# Patient Record
Sex: Male | Born: 1945 | Race: Black or African American | Hispanic: No | Marital: Single | Smoking: Former smoker
Health system: Southern US, Community
[De-identification: ages and names within clinical notes are randomized; demographics above are authoritative.]

## PROBLEM LIST (undated history)

## (undated) DIAGNOSIS — R569 Unspecified convulsions: Secondary | ICD-10-CM

## (undated) DIAGNOSIS — I2699 Other pulmonary embolism without acute cor pulmonale: Secondary | ICD-10-CM

## (undated) DIAGNOSIS — I609 Nontraumatic subarachnoid hemorrhage, unspecified: Secondary | ICD-10-CM

## (undated) DIAGNOSIS — I10 Essential (primary) hypertension: Secondary | ICD-10-CM

## (undated) DIAGNOSIS — I4891 Unspecified atrial fibrillation: Secondary | ICD-10-CM

## (undated) DIAGNOSIS — N183 Chronic kidney disease, stage 3 unspecified: Secondary | ICD-10-CM

## (undated) DIAGNOSIS — I272 Pulmonary hypertension, unspecified: Secondary | ICD-10-CM

## (undated) DIAGNOSIS — I639 Cerebral infarction, unspecified: Secondary | ICD-10-CM

## (undated) DIAGNOSIS — I5022 Chronic systolic (congestive) heart failure: Secondary | ICD-10-CM

## (undated) DIAGNOSIS — I429 Cardiomyopathy, unspecified: Secondary | ICD-10-CM

## (undated) DIAGNOSIS — J449 Chronic obstructive pulmonary disease, unspecified: Secondary | ICD-10-CM

## (undated) DIAGNOSIS — I5189 Other ill-defined heart diseases: Secondary | ICD-10-CM

## (undated) HISTORY — PX: ANKLE SURGERY: SHX546

## (undated) HISTORY — PX: TOTAL KNEE ARTHROPLASTY: SHX125

## (undated) HISTORY — PX: CORONARY ANGIOPLASTY: SHX604

---

## 2012-09-25 HISTORY — PX: INSERTION OF ICD: SHX6689

## 2014-07-15 DIAGNOSIS — I609 Nontraumatic subarachnoid hemorrhage, unspecified: Secondary | ICD-10-CM

## 2014-07-15 HISTORY — DX: Nontraumatic subarachnoid hemorrhage, unspecified: I60.9

## 2014-09-15 DIAGNOSIS — I639 Cerebral infarction, unspecified: Secondary | ICD-10-CM

## 2014-09-15 HISTORY — DX: Cerebral infarction, unspecified: I63.9

## 2014-10-14 DIAGNOSIS — I2699 Other pulmonary embolism without acute cor pulmonale: Secondary | ICD-10-CM

## 2014-10-14 HISTORY — PX: IVC FILTER PLACEMENT (ARMC HX): HXRAD1551

## 2014-10-14 HISTORY — DX: Other pulmonary embolism without acute cor pulmonale: I26.99

## 2015-05-16 HISTORY — PX: OTHER SURGICAL HISTORY: SHX169

## 2016-02-03 ENCOUNTER — Emergency Department (HOSPITAL_COMMUNITY)
Admission: EM | Admit: 2016-02-03 | Discharge: 2016-02-03 | Disposition: A | Discharge status: Home / Self Care | Payer: Medicare Other | Attending: Emergency Medicine | Admitting: Emergency Medicine

## 2016-02-03 ENCOUNTER — Emergency Department (HOSPITAL_COMMUNITY): Payer: Medicare Other

## 2016-02-03 ENCOUNTER — Encounter (HOSPITAL_COMMUNITY): Payer: Self-pay | Admitting: Emergency Medicine

## 2016-02-03 ENCOUNTER — Other Ambulatory Visit: Payer: Self-pay

## 2016-02-03 DIAGNOSIS — Z950 Presence of cardiac pacemaker: Secondary | ICD-10-CM | POA: Insufficient documentation

## 2016-02-03 DIAGNOSIS — Z79899 Other long term (current) drug therapy: Secondary | ICD-10-CM | POA: Diagnosis not present

## 2016-02-03 DIAGNOSIS — I509 Heart failure, unspecified: Secondary | ICD-10-CM | POA: Insufficient documentation

## 2016-02-03 DIAGNOSIS — Z7982 Long term (current) use of aspirin: Secondary | ICD-10-CM | POA: Diagnosis not present

## 2016-02-03 DIAGNOSIS — J441 Chronic obstructive pulmonary disease with (acute) exacerbation: Secondary | ICD-10-CM

## 2016-02-03 DIAGNOSIS — I110 Hypertensive heart disease with heart failure: Secondary | ICD-10-CM | POA: Insufficient documentation

## 2016-02-03 DIAGNOSIS — R0600 Dyspnea, unspecified: Secondary | ICD-10-CM

## 2016-02-03 DIAGNOSIS — R0602 Shortness of breath: Secondary | ICD-10-CM | POA: Diagnosis present

## 2016-02-03 HISTORY — DX: Unspecified convulsions: R56.9

## 2016-02-03 LAB — DIGOXIN LEVEL: DIGOXIN LVL: 0.2 ng/mL — AB (ref 0.8–2.0)

## 2016-02-03 LAB — CBC WITH DIFFERENTIAL/PLATELET
BASOS ABS: 0 10*3/uL (ref 0.0–0.1)
Basophils Relative: 1 %
EOS PCT: 4 %
Eosinophils Absolute: 0.2 10*3/uL (ref 0.0–0.7)
HCT: 38.9 % — ABNORMAL LOW (ref 39.0–52.0)
Hemoglobin: 12.6 g/dL — ABNORMAL LOW (ref 13.0–17.0)
LYMPHS PCT: 35 %
Lymphs Abs: 1.2 10*3/uL (ref 0.7–4.0)
MCH: 31.9 pg (ref 26.0–34.0)
MCHC: 32.4 g/dL (ref 30.0–36.0)
MCV: 98.5 fL (ref 78.0–100.0)
MONO ABS: 0.4 10*3/uL (ref 0.1–1.0)
MONOS PCT: 10 %
Neutro Abs: 1.8 10*3/uL (ref 1.7–7.7)
Neutrophils Relative %: 50 %
PLATELETS: 226 10*3/uL (ref 150–400)
RBC: 3.95 MIL/uL — ABNORMAL LOW (ref 4.22–5.81)
RDW: 14.7 % (ref 11.5–15.5)
WBC: 3.6 10*3/uL — ABNORMAL LOW (ref 4.0–10.5)

## 2016-02-03 LAB — COMPREHENSIVE METABOLIC PANEL
ALBUMIN: 3.9 g/dL (ref 3.5–5.0)
ALK PHOS: 76 U/L (ref 38–126)
ALT: 57 U/L (ref 17–63)
AST: 34 U/L (ref 15–41)
Anion gap: 4 — ABNORMAL LOW (ref 5–15)
BILIRUBIN TOTAL: 1 mg/dL (ref 0.3–1.2)
BUN: 17 mg/dL (ref 6–20)
CO2: 27 mmol/L (ref 22–32)
Calcium: 9.1 mg/dL (ref 8.9–10.3)
Chloride: 108 mmol/L (ref 101–111)
Creatinine, Ser: 1.22 mg/dL (ref 0.61–1.24)
GFR calc Af Amer: >60 mL/min (ref >60)
GFR calc non Af Amer: 60 mL/min — ABNORMAL LOW (ref >60)
GLUCOSE: 112 mg/dL — AB (ref 65–99)
POTASSIUM: 3.8 mmol/L (ref 3.5–5.1)
SODIUM: 139 mmol/L (ref 135–145)
TOTAL PROTEIN: 7.6 g/dL (ref 6.5–8.1)

## 2016-02-03 LAB — TROPONIN I: Troponin I: 0.03 ng/mL (ref <0.031)

## 2016-02-03 LAB — PROTIME-INR
INR: 2.12 — ABNORMAL HIGH (ref 0.00–1.49)
Prothrombin Time: 23.6 seconds — ABNORMAL HIGH (ref 11.6–15.2)

## 2016-02-03 LAB — BRAIN NATRIURETIC PEPTIDE: B Natriuretic Peptide: 1595 pg/mL — ABNORMAL HIGH (ref 0.0–100.0)

## 2016-02-03 MED ORDER — AEROCHAMBER Z-STAT PLUS/MEDIUM MISC
1.0000 | Freq: Once | Status: AC
  Administered 2016-02-03: 1

## 2016-02-03 MED ORDER — FUROSEMIDE 40 MG PO TABS: ORAL_TABLET | ORAL | Status: DC

## 2016-02-03 MED ORDER — NITROGLYCERIN 2 % TD OINT
1.0000 [in_us] | TOPICAL_OINTMENT | Freq: Once | TRANSDERMAL | Status: AC
  Administered 2016-02-03: 1 [in_us] via TOPICAL
  Filled 2016-02-03: qty 1

## 2016-02-03 MED ORDER — IPRATROPIUM BROMIDE 0.02 % IN SOLN
0.5000 mg | Freq: Once | RESPIRATORY_TRACT | Status: AC
  Administered 2016-02-03: 0.5 mg via RESPIRATORY_TRACT
  Filled 2016-02-03: qty 2.5

## 2016-02-03 MED ORDER — NITROGLYCERIN 0.4 MG SL SUBL: 0.4000 mg | SUBLINGUAL_TABLET | SUBLINGUAL | Status: AC | PRN

## 2016-02-03 MED ORDER — FUROSEMIDE 10 MG/ML IJ SOLN
80.0000 mg | Freq: Once | INTRAMUSCULAR | Status: AC
  Administered 2016-02-03: 80 mg via INTRAMUSCULAR
  Filled 2016-02-03: qty 8

## 2016-02-03 MED ORDER — ALBUTEROL SULFATE HFA 108 (90 BASE) MCG/ACT IN AERS
2.0000 | INHALATION_SPRAY | Freq: Four times a day (QID) | RESPIRATORY_TRACT | Status: DC | PRN
  Administered 2016-02-03: 2 via RESPIRATORY_TRACT
  Filled 2016-02-03: qty 6.7

## 2016-02-03 MED ORDER — METHYLPREDNISOLONE SODIUM SUCC 125 MG IJ SOLR
125.0000 mg | Freq: Once | INTRAMUSCULAR | Status: AC
  Administered 2016-02-03: 125 mg via INTRAVENOUS
  Filled 2016-02-03: qty 2

## 2016-02-03 MED ORDER — ALBUTEROL SULFATE (2.5 MG/3ML) 0.083% IN NEBU
5.0000 mg | INHALATION_SOLUTION | Freq: Once | RESPIRATORY_TRACT | Status: AC
  Administered 2016-02-03: 5 mg via RESPIRATORY_TRACT
  Filled 2016-02-03: qty 6

## 2016-02-03 NOTE — ED Notes (Addendum)
Pt was 100% on RA, stated feeling like he needed oxygen. Placed on 2L [place], remains 100%.

## 2016-02-03 NOTE — ED Notes (Signed)
Pt made aware to return if symptoms worsen or if any life threatening symptoms occur.   

## 2016-02-03 NOTE — ED Notes (Signed)
Ambulated patient around nurses station 2x patients sats stayed at 100% and heart rate was 79.

## 2016-02-03 NOTE — Discharge Instructions (Signed)
Use the inhaler for wheezing and shortness of breath. Increase the Lasix to 2 tablets twice a day for the next 2-3 days until the fluid is out of your lungs then take 1 tablet daily. Use a nitroglycerin tablet supple lingual lesion if you have trouble breathing and get the chest discomfort. You can call Dr. Ival Bible office, he is a cardiologist who is associated with [facility], but he has an office in [place]. Pursue getting a primary care doctor. Return to the ED if you breathing gets worse or you have severe chest pain. Metered Dose Inhaler With Spacer Inhaled medicines are the basis of treatment of asthma and other breathing problems. Inhaled medicine can only be effective if used properly. Good technique assures that the medicine reaches the lungs. Your health care provider has asked you to use a spacer with your inhaler to help you take the medicine more effectively. A spacer is a plastic tube with a mouthpiece on one end and an opening that connects to the inhaler on the other end. Metered dose inhalers (MDIs) are used to deliver a variety of inhaled medicines. These include quick relief or rescue medicines (such as bronchodilators) and controller medicines (such as corticosteroids). The medicine is delivered by pushing down on a metal canister to release a set amount of spray. If you are using different kinds of inhalers, use your quick relief medicine to open the airways 10-15 minutes before using a steroid if instructed to do so by your health care provider. If you are unsure which inhalers to use and the order of using them, ask your health care provider, nurse, or respiratory therapist. HOW TO USE THE INHALER WITH A SPACER  Remove cap from inhaler.  If you are using the inhaler for the first time, you will need to prime it. Shake the inhaler for 5 seconds and release four puffs into the air, away from your face. Ask your health care provider or pharmacist if you have questions about  priming your inhaler.  Shake inhaler for 5 seconds before each breath in (inhalation).  Place the open end of the spacer onto the mouthpiece of the inhaler.  Position the inhaler so that the top of the canister faces up and the spacer mouthpiece faces you.  Put your index finger on the top of the medicine canister. Your thumb supports the bottom of the inhaler and the spacer.  Breathe out (exhale) normally and as completely as possible.  Immediately after exhaling, place the spacer between your teeth and into your mouth. Close your mouth tightly around the spacer.  Press the canister down with the index finger to release the medicine.  At the same time as the canister is pressed, inhale deeply and slowly until the lungs are completely filled. This should take 4-6 seconds. Keep your tongue down and out of the way.  Hold the medicine in your lungs for 5-10 seconds (10 seconds is best). This helps the medicine get into the small airways of your lungs. Exhale.  Repeat inhaling deeply through the spacer mouthpiece. Again hold that breath for up to 10 seconds (10 seconds is best). Exhale slowly. If it is difficult to take this second deep breath through the spacer, breathe normally several times through the spacer. Remove the spacer from your mouth.  Wait at least 15-30 seconds between puffs. Continue with the above steps until you have taken the number of puffs your health care provider has ordered. Do not use the inhaler  more than your health care provider directs you to.  Remove spacer from the inhaler and place cap on inhaler.  Follow the directions from your health care provider or the inhaler insert for cleaning the inhaler and spacer. If you are using a steroid inhaler, rinse your mouth with water after your last puff, gargle, and spit out the water. Do not swallow the water. AVOID:  Inhaling before or after starting the spray of medicine. It takes practice to coordinate your breathing  with triggering the spray.  Inhaling through the nose (rather than the mouth) when triggering the spray. HOW TO DETERMINE IF YOUR INHALER IS FULL OR NEARLY EMPTY You cannot know when an inhaler is empty by shaking it. A few inhalers are now being made with dose counters. Ask your health care provider for a prescription that has a dose counter if you feel you need that extra help. If your inhaler does not have a counter, ask your health care provider to help you determine the date you need to refill your inhaler. Write the refill date on a calendar or your inhaler canister. Refill your inhaler 7-10 days before it runs out. Be sure to keep an adequate supply of medicine. This includes making sure it is not expired, and you have a spare inhaler.  SEEK MEDICAL CARE IF:   Symptoms are only partially relieved with your inhaler.  You are having trouble using your inhaler.  You experience some increase in phlegm. SEEK IMMEDIATE MEDICAL CARE IF:   You feel little or no relief with your inhalers. You are still wheezing and are feeling shortness of breath or tightness in your chest or both.  You have dizziness, headaches, or fast heart rate.  You have chills, fever, or night sweats.  There is a noticeable increase in phlegm production, or there is blood in the phlegm.   This information is not intended to replace advice given to you by your health care provider. Make sure you discuss any questions you have with your health care provider.   Document Released: 08/01/2005 Document Revised: 12/16/2014 Document Reviewed: 01/17/2013 Elsevier Interactive Patient Education 2016 [employer]  Heart Failure Heart failure is a condition in which the heart has trouble pumping blood. This means your heart does not pump blood efficiently for your body to work well. In some cases of heart failure, fluid may back up into your lungs or you may have swelling (edema) in your lower legs. Heart failure is usually a  long-term (chronic) condition. It is important for you to take good care of yourself and follow your health care provider's treatment plan. CAUSES  Some health conditions can cause heart failure. Those health conditions include:  High blood pressure (hypertension). Hypertension causes the heart muscle to work harder than normal. When pressure in the blood vessels is high, the heart needs to pump (contract) with more force in order to circulate blood throughout the body. High blood pressure eventually causes the heart to become stiff and weak.  Coronary artery disease (CAD). CAD is the buildup of cholesterol and fat (plaque) in the arteries of the heart. The blockage in the arteries deprives the heart muscle of oxygen and blood. This can cause chest pain and may lead to a heart attack. High blood pressure can also contribute to CAD.  Heart attack (myocardial infarction). A heart attack occurs when one or more arteries in the heart become blocked. The loss of oxygen damages the muscle tissue of the heart. When this  happens, part of the heart muscle dies. The injured tissue does not contract as well and weakens the heart's ability to pump blood.  Abnormal heart valves. When the heart valves do not open and close properly, it can cause heart failure. This makes the heart muscle pump harder to keep the blood flowing.  Heart muscle disease (cardiomyopathy or myocarditis). Heart muscle disease is damage to the heart muscle from a variety of causes. These can include drug or alcohol abuse, infections, or unknown reasons. These can increase the risk of heart failure.  Lung disease. Lung disease makes the heart work harder because the lungs do not work properly. This can cause a strain on the heart, leading it to fail.  Diabetes. Diabetes increases the risk of heart failure. High blood sugar contributes to high fat (lipid) levels in the blood. Diabetes can also cause slow damage to tiny blood vessels that  carry important nutrients to the heart muscle. When the heart does not get enough oxygen and food, it can cause the heart to become weak and stiff. This leads to a heart that does not contract efficiently.  Other conditions can contribute to heart failure. These include abnormal heart rhythms, thyroid problems, and low blood counts (anemia). Certain unhealthy behaviors can increase the risk of heart failure, including:  Being overweight.  Smoking or chewing tobacco.  Eating foods high in fat and cholesterol.  Abusing illicit drugs or alcohol.  Lacking physical activity. SYMPTOMS  Heart failure symptoms may vary and can be hard to detect. Symptoms may include:  Shortness of breath with activity, such as climbing stairs.  Persistent cough.  Swelling of the feet, ankles, legs, or abdomen.  Unexplained weight gain.  Difficulty breathing when lying flat (orthopnea).  Waking from sleep because of the need to sit up and get more air.  Rapid heartbeat.  Fatigue and loss of energy.  Feeling light-headed, dizzy, or close to fainting.  Loss of appetite.  Nausea.  Increased urination during the night (nocturia). DIAGNOSIS  A diagnosis of heart failure is based on your history, symptoms, physical examination, and diagnostic tests. Diagnostic tests for heart failure may include:  Echocardiography.  Electrocardiography.  Chest X-ray.  Blood tests.  Exercise stress test.  Cardiac angiography.  Radionuclide scans. TREATMENT  Treatment is aimed at managing the symptoms of heart failure. Medicines, behavioral changes, or surgical intervention may be necessary to treat heart failure.  Medicines to help treat heart failure may include:  Angiotensin-converting enzyme (ACE) inhibitors. This type of medicine blocks the effects of a blood protein called angiotensin-converting enzyme. ACE inhibitors relax (dilate) the blood vessels and help lower blood pressure.  Angiotensin  receptor blockers (ARBs). This type of medicine blocks the actions of a blood protein called angiotensin. Angiotensin receptor blockers dilate the blood vessels and help lower blood pressure.  Water pills (diuretics). Diuretics cause the kidneys to remove salt and water from the blood. The extra fluid is removed through urination. This loss of extra fluid lowers the volume of blood the heart pumps.  Beta blockers. These prevent the heart from beating too fast and improve heart muscle strength.  Digitalis. This increases the force of the heartbeat.  Healthy behavior changes include:  Obtaining and maintaining a healthy weight.  Stopping smoking or chewing tobacco.  Eating heart-healthy foods.  Limiting or avoiding alcohol.  Stopping illicit drug use.  Physical activity as directed by your health care provider.  Surgical treatment for heart failure may include:  A procedure to  open blocked arteries, repair damaged heart valves, or remove damaged heart muscle tissue.  A pacemaker to improve heart muscle function and control certain abnormal heart rhythms.  An internal cardioverter defibrillator to treat certain serious abnormal heart rhythms.  A left ventricular assist device (LVAD) to assist the pumping ability of the heart. HOME CARE INSTRUCTIONS   Take medicines only as directed by your health care provider. Medicines are important in reducing the workload of your heart, slowing the progression of heart failure, and improving your symptoms.  Do not stop taking your medicine unless directed by your health care provider.  Do not skip any dose of medicine.  Refill your prescriptions before you run out of medicine. Your medicines are needed every day.  Engage in moderate physical activity if directed by your health care provider. Moderate physical activity can benefit some people. The elderly and people with severe heart failure should consult with a health care provider for  physical activity recommendations.  Eat heart-healthy foods. Food choices should be free of trans fat and low in saturated fat, cholesterol, and salt (sodium). Healthy choices include fresh or frozen fruits and vegetables, fish, lean meats, legumes, fat-free or low-fat dairy products, and whole grain or high fiber foods. Talk to a dietitian to learn more about heart-healthy foods.  Limit sodium if directed by your health care provider. Sodium restriction may reduce symptoms of heart failure in some people. Talk to a dietitian to learn more about heart-healthy seasonings.  Use healthy cooking methods. Healthy cooking methods include roasting, grilling, broiling, baking, poaching, steaming, or stir-frying. Talk to a dietitian to learn more about healthy cooking methods.  Limit fluids if directed by your health care provider. Fluid restriction may reduce symptoms of heart failure in some people.  Weigh yourself every day. Daily weights are important in the early recognition of excess fluid. You should weigh yourself every morning after you urinate and before you eat breakfast. Wear the same amount of clothing each time you weigh yourself. Record your daily weight. Provide your health care provider with your weight record.  Monitor and record your blood pressure if directed by your health care provider.  Check your pulse if directed by your health care provider.  Lose weight if directed by your health care provider. Weight loss may reduce symptoms of heart failure in some people.  Stop smoking or chewing tobacco. Nicotine makes your heart work harder by causing your blood vessels to constrict. Do not use nicotine gum or patches before talking to your health care provider.  Keep all follow-up visits as directed by your health care provider. This is important.  Limit alcohol intake to no more than 1 drink per day for nonpregnant women and 2 drinks per day for men. One drink equals 12 ounces of beer,  5 ounces of wine, or 1 ounces of hard liquor. Drinking more than that is harmful to your heart. Tell your health care provider if you drink alcohol several times a week. Talk with your health care provider about whether alcohol is safe for you. If your heart has already been damaged by alcohol or you have severe heart failure, drinking alcohol should be stopped completely.  Stop illicit drug use.  Stay up-to-date with immunizations. It is especially important to prevent respiratory infections through current pneumococcal and influenza immunizations.  Manage other health conditions such as hypertension, diabetes, thyroid disease, or abnormal heart rhythms as directed by your health care provider.  Learn to manage stress.  Plan  rest periods when fatigued.  Learn strategies to manage high temperatures. If the weather is extremely hot:  Avoid vigorous physical activity.  Use air conditioning or fans or seek a cooler location.  Avoid caffeine and alcohol.  Wear loose-fitting, lightweight, and light-colored clothing.  Learn strategies to manage cold temperatures. If the weather is extremely cold:  Avoid vigorous physical activity.  Layer clothes.  Wear mittens or gloves, a hat, and a scarf when going outside.  Avoid alcohol.  Obtain ongoing education and support as needed.  Participate in or seek rehabilitation as needed to maintain or improve independence and quality of life. SEEK MEDICAL CARE IF:   You have a rapid weight gain.  You have increasing shortness of breath that is unusual for you.  You are unable to participate in your usual physical activities.  You tire easily.  You cough more than normal, especially with physical activity.  You have any or more swelling in areas such as your hands, feet, ankles, or abdomen.  You are unable to sleep because it is hard to breathe.  You feel like your heart is beating fast (palpitations).  You become dizzy or light-headed  upon standing up. SEEK IMMEDIATE MEDICAL CARE IF:   You have difficulty breathing.  There is a change in mental status such as decreased alertness or difficulty with concentration.  You have a pain or discomfort in your chest.  You have an episode of fainting (syncope). MAKE SURE YOU:   Understand these instructions.  Will watch your condition.  Will get help right away if you are not doing well or get worse.   This information is not intended to replace advice given to you by your health care provider. Make sure you discuss any questions you have with your health care provider.   Document Released: 08/01/2005 Document Revised: 12/16/2014 Document Reviewed: 08/31/2012 Elsevier Interactive Patient Education [employer]

## 2016-02-03 NOTE — ED Provider Notes (Addendum)
CSN: [id number]     Arrival date & time 02/03/16  0458 History   First MD Initiated Contact with Patient 02/03/16 0507 AM   Chief Complaint  Patient presents with  . Shortness of Breath     (Consider location/radiation/quality/duration/timing/severity/associated sxs/prior Treatment) HPI patient reports he just moved to [place] from [place] on June 17. He went to the ED at [facility] on the following day. He states they gave him a water pill. He states he has had PND since 2013. He states several months ago he was admitted to the hospital for a week then sent to a rehabilitation center to get his blood pressure controlled. He reports that about 10 PM tonight he started feeling short of breath and felt like he was wheezing. He states he has used inhalers and nebulizers in the past but not in a long time. He states he's had a cough for a long time and coughs up brown mucus. He states he gets chest pain in the center of his chest when he gets short of breath it gets worse. He describes it as a burning and aching feeling. He denies any swelling of his abdomen or extremities. He denies any fever. He has been on home oxygen in the past but not since he had his pacemaker inserted.He states "the fluid pill they gave me isn't strong enough".  PCP none local yet, has called Dr Almond Lint office in [place]  Past Medical History  Diagnosis Date  . Seizures (HCC)   . Hypertension   . CHF (congestive heart failure) [facility])    Past Surgical History  Procedure Laterality Date  . Pacemaker insertion     No family history on file. Social History  Substance Use Topics  . Smoking status: Never Smoker   . Smokeless tobacco: None  . Alcohol Use: No  lives alone Used to smoke 1-1/2 packs a day until 3 years ago  Review of Systems  All other systems reviewed and are negative.     Allergies  Review of patient's allergies indicates no known allergies.  Home Medications   Prior to Admission  medications   Medication Sig Start Date End Date Taking? Authorizing Provider  aspirin EC 81 MG tablet Take 81 mg by mouth daily.   Yes Historical Provider, MD  carvedilol (COREG) 12.5 MG tablet Take 12.5 mg by mouth 2 (two) times daily with a meal.   Yes Historical Provider, MD  digoxin (LANOXIN) 0.125 MG tablet Take by mouth daily.   Yes Historical Provider, MD  esomeprazole (NEXIUM) 20 MG capsule Take 20 mg by mouth daily at 12 noon.   Yes Historical Provider, MD  hydrALAZINE (APRESOLINE) 25 MG tablet Take 25 mg by mouth 3 (three) times daily.   Yes Historical Provider, MD  levETIRAcetam (KEPPRA) 500 MG tablet Take 500 mg by mouth 2 (two) times daily.   Yes Historical Provider, MD  lisinopril (PRINIVIL,ZESTRIL) 2.5 MG tablet Take 2.5 mg by mouth 2 (two) times daily.   Yes Historical Provider, MD  LORazepam (ATIVAN) 0.5 MG tablet Take 0.5 mg by mouth every 8 (eight) hours as needed for anxiety.   Yes Historical Provider, MD  potassium chloride SA (K-DUR,KLOR-CON) 20 MEQ tablet Take 40 mEq by mouth daily.   Yes Historical Provider, MD  warfarin (COUMADIN) 1 MG tablet Take 1 mg by mouth daily at 6 PM.   Yes Historical Provider, MD  furosemide (LASIX) 40 MG tablet Take 2 po BID x 2-3 days until the fluid  is gone then take 1 po QD 02/03/16   Devoria Albe, MD  nitroGLYCERIN (NITROSTAT) 0.4 MG SL tablet Place 1 tablet (0.4 mg total) under the tongue every 5 (five) minutes as needed for chest pain. 02/03/16   Devoria Albe, MD   BP 109/82 mmHg  Pulse 58  Temp(Src) 98.4 F (36.9 C) (Oral)  Resp 20  SpO2 100%    Vital signs normal Except bradycardia   Physical Exam  Constitutional: He is oriented to person, place, and time. He appears well-developed and well-nourished.  Non-toxic appearance. He does not appear ill. No distress.  HENT:  Head: Normocephalic and atraumatic.  Right Ear: External ear normal.  Left Ear: External ear normal.  Nose: Nose normal. No mucosal edema or rhinorrhea.  Mouth/Throat:  Oropharynx is clear and moist and mucous membranes are normal. No dental abscesses or uvula swelling.  Eyes: Conjunctivae and EOM are normal. Pupils are equal, round, and reactive to light.  Neck: Normal range of motion and full passive range of motion without pain. Neck supple.  Cardiovascular: Normal rate, regular rhythm and normal heart sounds.  Exam reveals no gallop and no friction rub.   No murmur heard. Pulmonary/Chest: Effort normal and breath sounds normal. No respiratory distress. He has no wheezes. He has no rhonchi. He has no rales. He exhibits no tenderness and no crepitus.  Pacemaker in left chest. Pt coughs when he takes a deep breath  Abdominal: Soft. Normal appearance and bowel sounds are normal. He exhibits no distension. There is no tenderness. There is no rebound and no guarding.  Musculoskeletal: Normal range of motion. He exhibits no edema or tenderness.  Moves all extremities well.   Neurological: He is alert and oriented to person, place, and time. He has normal strength. No cranial nerve deficit.  Skin: Skin is warm, dry and intact. No rash noted. No erythema. No pallor.  Psychiatric: He has a normal mood and affect. His speech is normal and behavior is normal. His mood appears not anxious.  Nursing note and vitals reviewed.   ED Course  Procedures (including critical care time)  Medications  albuterol (PROVENTIL HFA;VENTOLIN HFA) 108 (90 Base) MCG/ACT inhaler 2 puff (not administered)  aerochamber Z-Stat Plus/medium 1 each (not administered)  nitroGLYCERIN (NITROGLYN) 2 % ointment 1 inch (1 inch Topical Given 02/03/16 0545)  albuterol (PROVENTIL) (2.5 MG/3ML) 0.083% nebulizer solution 5 mg (5 mg Nebulization Given 02/03/16 0530)  ipratropium (ATROVENT) nebulizer solution 0.5 mg (0.5 mg Nebulization Given 02/03/16 0530)  furosemide (LASIX) injection 80 mg (80 mg Intramuscular Given 02/03/16 0538)  methylPREDNISolone sodium succinate (SOLU-MEDROL) 125 mg/2 mL injection  125 mg (125 mg Intravenous Given 02/03/16 0537)    Patient had nitroglycerin paste placed on his chest, he was given a albuterol and Atrovent nebulizer treatment. He was given a dose of Solu-Medrol. Although I did not hear wheezing patient states he has used inhalers in the past and he used to be a heavy smoker. Patient was given Lasix 80 mg IV which is double his oral dose of Lasix.  Patient had some hard 50 mL of urinary output. When he was rechecked at 6 AM he states he feels better.  6:30 AM we went over his test results. Patient states he wants to go home. He states "I can take the fluid off at home". Patient is noted to be lying flat in bed without respiratory distress.  Nurses ambulated patient and he maintained his pulse ox at 100%. At this point I  feel like he can be managed as an outpatient. I have discussed with him about taking 2 of his Lasix pills twice a day for the next 2-3 days until he gets the fluid out of his lungs and then he can go back to once a day.  Labs Review Results for orders placed or performed during the hospital encounter of 02/03/16  Comprehensive metabolic panel  Result Value Ref Range   Sodium 139 135 - 145 mmol/L   Potassium 3.8 3.5 - 5.1 mmol/L   Chloride 108 101 - 111 mmol/L   CO2 27 22 - 32 mmol/L   Glucose, Bld 112 (H) 65 - 99 mg/dL   BUN 17 6 - 20 mg/dL   Creatinine, Ser 6.29 0.61 - 1.24 mg/dL   Calcium 9.1 8.9 - 52.8 mg/dL   Total Protein 7.6 6.5 - 8.1 g/dL   Albumin 3.9 3.5 - 5.0 g/dL   AST 34 15 - 41 U/L   ALT 57 17 - 63 U/L   Alkaline Phosphatase 76 38 - 126 U/L   Total Bilirubin 1.0 0.3 - 1.2 mg/dL   GFR calc non Af Amer 60 (L) >60 mL/min   GFR calc Af Amer >60 >60 mL/min   Anion gap 4 (L) 5 - 15  CBC with Differential  Result Value Ref Range   WBC 3.6 (L) 4.0 - 10.5 K/uL   RBC 3.95 (L) 4.22 - 5.81 MIL/uL   Hemoglobin 12.6 (L) 13.0 - 17.0 g/dL   HCT 41.3 (L) 24.4 - 01.0 %   MCV 98.5 78.0 - 100.0 fL   MCH 31.9 26.0 - 34.0 pg   MCHC  32.4 30.0 - 36.0 g/dL   RDW 27.2 53.6 - 64.4 %   Platelets 226 150 - 400 K/uL   Neutrophils Relative % 50 %   Neutro Abs 1.8 1.7 - 7.7 K/uL   Lymphocytes Relative 35 %   Lymphs Abs 1.2 0.7 - 4.0 K/uL   Monocytes Relative 10 %   Monocytes Absolute 0.4 0.1 - 1.0 K/uL   Eosinophils Relative 4 %   Eosinophils Absolute 0.2 0.0 - 0.7 K/uL   Basophils Relative 1 %   Basophils Absolute 0.0 0.0 - 0.1 K/uL  Troponin I  Result Value Ref Range   Troponin I 0.03 <0.031 ng/mL  Brain natriuretic peptide  Result Value Ref Range   B Natriuretic Peptide 1595.0 (H) 0.0 - 100.0 pg/mL  Digoxin level  Result Value Ref Range   Digoxin Level 0.2 (L) 0.8 - 2.0 ng/mL  Protime-INR  Result Value Ref Range   Prothrombin Time 23.6 (H) 11.6 - 15.2 seconds   INR 2.12 (H) 0.00 - 1.49   Laboratory interpretation all normal except elevated BNP without baseline, therapeutic INR, mild anemia, subtherapeutic digoxin level   Imaging Review Dg Chest Port 1 View  02/03/2016  CLINICAL DATA:  70 year old male with shortness of breath EXAM: PORTABLE CHEST 1 VIEW COMPARISON:  None. FINDINGS: Single portable view of the chest demonstrate cardiomegaly with central vascular and interstitial prominence compatible with congestive changes. There bibasilar atelectatic changes of the lungs. There is no focal consolidation or pneumothorax. No significant pleural effusion. Left pectoral AICD device. No acute osseous pathology. IMPRESSION: Cardiomegaly with congestive changes. Electronically Signed   By: Elgie Collard M.D.   On: 02/03/2016 05:48   I have personally reviewed and evaluated these images and lab results as part of my medical decision-making.   EKG Interpretation   Date/Time:  Wednesday February 03 2016 05:06:57 EDT Ventricular Rate:  70 PR Interval:    QRS Duration: 156 QT Interval:  444 QTC Calculation: 480 R Axis:   -110 Text Interpretation:  Atrial-ventricular dual-paced complexes No further  analysis  attempted due to paced rhythm Baseline wander in lead(s) II III  aVF V2 No old tracing to compare Confirmed by Solange Emry  MD-I, Pritika Alvarez ([id number]) on  02/03/2016 5:29:21 AM      MDM   Final diagnoses:  Dyspnea, paroxysmal nocturnal  Acute on chronic congestive heart failure, unspecified congestive heart failure type (HCC)  COPD exacerbation (HCC)    New Prescriptions   NITROGLYCERIN (NITROSTAT) 0.4 MG SL TABLET    Place 1 tablet (0.4 mg total) under the tongue every 5 (five) minutes as needed for chest pain.   Modified Medications   Modified Medication Previous Medication   FUROSEMIDE (LASIX) 40 MG TABLET furosemide (LASIX) 40 MG tablet      Take 2 po BID x 2-3 days until the fluid is gone then take 1 po QD    Take 40 mg by mouth daily.    Plan discharge  Devoria Albe, MD, Concha Pyo, MD 02/03/16 4765  Devoria Albe, MD 02/03/16 4650

## 2016-02-03 NOTE — ED Notes (Signed)
Pt c/o sob. Pt states he was seen at [facility] for the same Sunday.

## 2016-02-03 NOTE — ED Notes (Signed)
resp therapy paged for inhaler and aerochamber,

## 2016-02-12 ENCOUNTER — Encounter (HOSPITAL_COMMUNITY): Payer: Self-pay | Admitting: Emergency Medicine

## 2016-02-12 ENCOUNTER — Emergency Department (HOSPITAL_COMMUNITY): Payer: Medicare Other

## 2016-02-12 ENCOUNTER — Emergency Department (HOSPITAL_COMMUNITY)
Admission: EM | Admit: 2016-02-12 | Discharge: 2016-02-12 | Disposition: A | Discharge status: Home / Self Care | Payer: Medicare Other | Attending: Emergency Medicine | Admitting: Emergency Medicine

## 2016-02-12 DIAGNOSIS — R079 Chest pain, unspecified: Secondary | ICD-10-CM | POA: Insufficient documentation

## 2016-02-12 DIAGNOSIS — Z7982 Long term (current) use of aspirin: Secondary | ICD-10-CM | POA: Diagnosis not present

## 2016-02-12 DIAGNOSIS — I110 Hypertensive heart disease with heart failure: Secondary | ICD-10-CM | POA: Diagnosis not present

## 2016-02-12 DIAGNOSIS — Z7901 Long term (current) use of anticoagulants: Secondary | ICD-10-CM | POA: Diagnosis not present

## 2016-02-12 DIAGNOSIS — R0602 Shortness of breath: Secondary | ICD-10-CM

## 2016-02-12 DIAGNOSIS — I4891 Unspecified atrial fibrillation: Secondary | ICD-10-CM | POA: Insufficient documentation

## 2016-02-12 DIAGNOSIS — Z8669 Personal history of other diseases of the nervous system and sense organs: Secondary | ICD-10-CM | POA: Diagnosis not present

## 2016-02-12 DIAGNOSIS — Z950 Presence of cardiac pacemaker: Secondary | ICD-10-CM | POA: Diagnosis not present

## 2016-02-12 DIAGNOSIS — I509 Heart failure, unspecified: Secondary | ICD-10-CM | POA: Insufficient documentation

## 2016-02-12 DIAGNOSIS — Z79899 Other long term (current) drug therapy: Secondary | ICD-10-CM | POA: Diagnosis not present

## 2016-02-12 LAB — CBC WITH DIFFERENTIAL/PLATELET
BASOS ABS: 0 10*3/uL (ref 0.0–0.1)
BASOS PCT: 1 %
Eosinophils Absolute: 0.1 10*3/uL (ref 0.0–0.7)
Eosinophils Relative: 2 %
HEMATOCRIT: 39.2 % (ref 39.0–52.0)
HEMOGLOBIN: 12.7 g/dL — AB (ref 13.0–17.0)
LYMPHS PCT: 28 %
Lymphs Abs: 1.1 10*3/uL (ref 0.7–4.0)
MCH: 31.4 pg (ref 26.0–34.0)
MCHC: 32.4 g/dL (ref 30.0–36.0)
MCV: 97 fL (ref 78.0–100.0)
MONOS PCT: 12 %
Monocytes Absolute: 0.5 10*3/uL (ref 0.1–1.0)
NEUTROS ABS: 2.2 10*3/uL (ref 1.7–7.7)
NEUTROS PCT: 57 %
Platelets: 251 10*3/uL (ref 150–400)
RBC: 4.04 MIL/uL — ABNORMAL LOW (ref 4.22–5.81)
RDW: 14.9 % (ref 11.5–15.5)
WBC: 3.9 10*3/uL — ABNORMAL LOW (ref 4.0–10.5)

## 2016-02-12 LAB — COMPREHENSIVE METABOLIC PANEL
ALK PHOS: 68 U/L (ref 38–126)
ALT: 24 U/L (ref 17–63)
AST: 24 U/L (ref 15–41)
Albumin: 4 g/dL (ref 3.5–5.0)
Anion gap: 7 (ref 5–15)
BILIRUBIN TOTAL: 1.2 mg/dL (ref 0.3–1.2)
BUN: 18 mg/dL (ref 6–20)
CALCIUM: 9.1 mg/dL (ref 8.9–10.3)
CO2: 24 mmol/L (ref 22–32)
CREATININE: 1.32 mg/dL — AB (ref 0.61–1.24)
Chloride: 109 mmol/L (ref 101–111)
GFR calc Af Amer: >60 mL/min (ref >60)
GFR, EST NON AFRICAN AMERICAN: 54 mL/min — AB (ref >60)
GLUCOSE: 117 mg/dL — AB (ref 65–99)
POTASSIUM: 3.1 mmol/L — AB (ref 3.5–5.1)
SODIUM: 140 mmol/L (ref 135–145)
TOTAL PROTEIN: 7.8 g/dL (ref 6.5–8.1)

## 2016-02-12 LAB — TROPONIN I
TROPONIN I: 0.03 ng/mL — AB (ref <0.03)
TROPONIN I: 0.03 ng/mL — AB (ref <0.03)

## 2016-02-12 LAB — I-STAT TROPONIN, ED: TROPONIN I, POC: 0 ng/mL (ref 0.00–0.08)

## 2016-02-12 LAB — D-DIMER, QUANTITATIVE: D-Dimer, Quant: 0.91 ug/mL-FEU — ABNORMAL HIGH (ref 0.00–0.50)

## 2016-02-12 MED ORDER — ALBUTEROL SULFATE HFA 108 (90 BASE) MCG/ACT IN AERS: 2.0000 | INHALATION_SPRAY | Freq: Four times a day (QID) | RESPIRATORY_TRACT | Status: AC | PRN

## 2016-02-12 MED ORDER — HYDROCODONE-ACETAMINOPHEN 5-325 MG PO TABS
1.0000 | ORAL_TABLET | Freq: Once | ORAL | Status: AC
  Administered 2016-02-12: 1 via ORAL
  Filled 2016-02-12: qty 1

## 2016-02-12 MED ORDER — ALBUTEROL SULFATE HFA 108 (90 BASE) MCG/ACT IN AERS
2.0000 | INHALATION_SPRAY | Freq: Once | RESPIRATORY_TRACT | Status: AC
  Administered 2016-02-12: 2 via RESPIRATORY_TRACT
  Filled 2016-02-12: qty 6.7

## 2016-02-12 MED ORDER — POTASSIUM CHLORIDE CRYS ER 20 MEQ PO TBCR: 40.0000 meq | EXTENDED_RELEASE_TABLET | Freq: Two times a day (BID) | ORAL | Status: DC

## 2016-02-12 MED ORDER — IPRATROPIUM-ALBUTEROL 0.5-2.5 (3) MG/3ML IN SOLN
3.0000 mL | RESPIRATORY_TRACT | Status: DC
  Administered 2016-02-12: 3 mL via RESPIRATORY_TRACT
  Filled 2016-02-12: qty 3

## 2016-02-12 MED ORDER — IOPAMIDOL (ISOVUE-370) INJECTION 76%
100.0000 mL | Freq: Once | INTRAVENOUS | Status: AC | PRN
  Administered 2016-02-12: 100 mL via INTRAVENOUS

## 2016-02-12 MED ORDER — POTASSIUM CHLORIDE CRYS ER 20 MEQ PO TBCR
40.0000 meq | EXTENDED_RELEASE_TABLET | Freq: Once | ORAL | Status: AC
Start: 2016-02-12 — End: 2016-02-12
  Administered 2016-02-12: 40 meq via ORAL
  Filled 2016-02-12: qty 2

## 2016-02-12 NOTE — ED Provider Notes (Signed)
CSN: [id number]     Arrival date & time 02/12/16  1121 History  By signing my name below, I, Johnathan Morrison, attest that this documentation has been prepared under the direction and in the presence of Johnathan Memos, MD.  Electronically Signed: Gillis Ends. Lyn Hollingshead, ED Scribe. 02/12/2016. 12:20 PM.   Chief Complaint  Patient presents with  . Atrial Fibrillation    The history is provided by the patient. No language interpreter was used.    HPI Comments: Johnathan Morrison is a 70 y.o. male with PMHx of HTN and CHF who presents to the Emergency Department complaining of gradual onset, intermittent, 9/10, sharp left-sided chest pain x 1 day. Pt notes that pain is due to heart palpitations and it is surrounding defibrillator in his chest. He states he had his pacemaker inserted because "one of the valves in my heart is bad." He has associated SOB, which he reports is worse than normal, and a productive cough. Denies any leg swelling.  Past Medical History  Diagnosis Date  . Seizures (HCC)   . Hypertension   . CHF (congestive heart failure) [facility])    Past Surgical History  Procedure Laterality Date  . Pacemaker insertion     History reviewed. No pertinent family history. Social History  Substance Use Topics  . Smoking status: Never Smoker   . Smokeless tobacco: None  . Alcohol Use: No    Review of Systems  Constitutional: Negative for fever.  Respiratory: Positive for cough and shortness of breath.   Cardiovascular: Positive for chest pain and palpitations. Negative for leg swelling.  All other systems reviewed and are negative.   Allergies  Review of patient's allergies indicates no known allergies.  Home Medications   Prior to Admission medications   Medication Sig Start Date End Date Taking? Authorizing Provider  aspirin EC 81 MG tablet Take 81 mg by mouth daily.   Yes Historical Provider, MD  atorvastatin (LIPITOR) 40 MG tablet Take 1 tablet by mouth daily. 01/27/16   Yes Historical Provider, MD  carvedilol (COREG) 12.5 MG tablet Take 12.5 mg by mouth 2 (two) times daily with a meal.   Yes Historical Provider, MD  digoxin (LANOXIN) 0.125 MG tablet Take 0.125 mg by mouth daily.    Yes Historical Provider, MD  esomeprazole (NEXIUM) 20 MG capsule Take 20 mg by mouth daily at 12 noon.   Yes Historical Provider, MD  furosemide (LASIX) 40 MG tablet Take 2 po BID x 2-3 days until the fluid is gone then take 1 po QD Patient taking differently: Take 40 mg by mouth daily.  02/03/16  Yes Devoria Albe, MD  hydrALAZINE (APRESOLINE) 25 MG tablet Take 25 mg by mouth 3 (three) times daily.   Yes Historical Provider, MD  levETIRAcetam (KEPPRA) 500 MG tablet Take 500 mg by mouth 2 (two) times daily.   Yes Historical Provider, MD  lisinopril (PRINIVIL,ZESTRIL) 2.5 MG tablet Take 2.5 mg by mouth 2 (two) times daily.   Yes Historical Provider, MD  LORazepam (ATIVAN) 0.5 MG tablet Take 0.5 mg by mouth every 8 (eight) hours as needed for anxiety.   Yes Historical Provider, MD  nitroGLYCERIN (NITROSTAT) 0.4 MG SL tablet Place 1 tablet (0.4 mg total) under the tongue every 5 (five) minutes as needed for chest pain. 02/03/16  Yes Devoria Albe, MD  warfarin (COUMADIN) 5 MG tablet Take 5 mg by mouth daily.   Yes Historical Provider, MD  albuterol (PROVENTIL HFA;VENTOLIN HFA) 108 (90 Base) MCG/ACT inhaler  Inhale 2 puffs into the lungs every 6 (six) hours as needed for wheezing or shortness of breath. 02/12/16   Johnathan Memos, MD  potassium chloride SA (K-DUR,KLOR-CON) 20 MEQ tablet Take 2 tablets (40 mEq total) by mouth 2 (two) times daily. 02/12/16 02/19/16  Johnathan Memos, MD   BP 94/67 mmHg  Pulse 64  Temp(Src) 97.6 F (36.4 C) (Oral)  Resp 20  Ht 6' (1.829 m)  Wt 192 lb (87.091 kg)  BMI 26.03 kg/m2  SpO2 96% Physical Exam  Constitutional: He is oriented to person, place, and time. He appears well-developed and well-nourished. No distress.  HENT:  Head: Normocephalic and atraumatic.  Eyes:  Conjunctivae are normal.  Cardiovascular: Normal rate, regular rhythm and normal heart sounds.   Pulmonary/Chest: Effort normal and breath sounds normal. No respiratory distress. He has no wheezes.  Abdominal: He exhibits no distension.  Neurological: He is alert and oriented to person, place, and time.  Skin: Skin is warm and dry. No erythema.  Defibrillator in place. No erythema, tenderness, or induration around site.  Psychiatric: He has a normal mood and affect.  Nursing note and vitals reviewed.   ED Course  Procedures (including critical care time) DIAGNOSTIC STUDIES: Oxygen Saturation is 98% on RA, normal by my interpretation.    COORDINATION OF CARE: 11:45 AM-Discussed treatment plan which includes EKG, D-dimer, CBC, CMP, and Troponin with pt at bedside and pt agreed to plan. Will order Norco and Duoneb treatment.  Labs Review Labs Reviewed  CBC WITH DIFFERENTIAL/PLATELET - Abnormal; Notable for the following:    WBC 3.9 (*)    RBC 4.04 (*)    Hemoglobin 12.7 (*)    All other components within normal limits  COMPREHENSIVE METABOLIC PANEL - Abnormal; Notable for the following:    Potassium 3.1 (*)    Glucose, Bld 117 (*)    Creatinine, Ser 1.32 (*)    GFR calc non Af Amer 54 (*)    All other components within normal limits  TROPONIN I - Abnormal; Notable for the following:    Troponin I 0.03 (*)    All other components within normal limits  D-DIMER, QUANTITATIVE (NOT AT [facility]) - Abnormal; Notable for the following:    D-Dimer, Quant 0.91 (*)    All other components within normal limits  TROPONIN I - Abnormal; Notable for the following:    Troponin I 0.03 (*)    All other components within normal limits  I-STAT TROPOININ, ED    Imaging Review Ct Angio Chest Pe W/cm &/or Wo Cm  02/12/2016  CLINICAL DATA:  LEFT side chest pain with shortness of breath for 1 day, history hypertension, pacemaker, CHF EXAM: CT ANGIOGRAPHY CHEST WITH CONTRAST TECHNIQUE: Multidetector CT  imaging of the chest was performed using the standard protocol during bolus administration of intravenous contrast. Multiplanar CT image reconstructions and MIPs were obtained to evaluate the vascular anatomy. CONTRAST:  100 cc Isovue 370 IV COMPARISON:  None FINDINGS: Cardiovascular: Few scattered atherosclerotic calcifications aorta and coronary arteries. Upper normal caliber ascending thoracic aorta 3.8 cm diameter. Mild limitations of exam secondary to scattered respiratory motion artifacts. Enlarged central pulmonary arteries. Pulmonary arteries well opacified and grossly patent. No definite evidence of pulmonary embolism. Beam hardening artifacts from LEFT subclavian pacemaker leads in RIGHT atrium, RIGHT ventricle, and coronary sinus. Enlargement of cardiac chambers and cardiac size. Mediastinum/Nodes: Esophagus unremarkable. Scattered normal size mediastinal lymph nodes. Lungs/Pleura: Small RIGHT pleural effusion. Scattered respiratory motion artifacts. Minimal compressive atelectasis RIGHT lower  lobe. Peripheral interstitial changes at both lung bases. No additional infiltrate, pneumothorax, or LEFT pleural effusion. Upper Abdomen: Distended IVC and hepatic veins with reflux of contrast question elevated RIGHT heart pressure versus sequela power injection. Remaining upper abdomen normal. Musculoskeletal: No acute osseous findings. Review of the MIP images confirms the above findings. IMPRESSION: No gross evidence of pulmonary embolism identified on exam slightly limited by rash to motion artifacts. Question pulmonary arterial hypertension. Small RIGHT pleural effusion with minimal basilar atelectasis. Enlargement of cardiac chambers with evidence of prior pacemaker placement. Electronically Signed   By: Ulyses Southward M.D.   On: 02/12/2016 14:42   I have personally reviewed and evaluated these images and lab results as part of my medical decision-making.   EKG Interpretation   Date/Time:  Friday February 12 2016 11:27:48 EDT Ventricular Rate:  74 PR Interval:    QRS Duration: 145 QT Interval:  441 QTC Calculation: 476 R Axis:   -75 Text Interpretation:  A-V dual-paced complexes w/ some inhibition No  further analysis attempted due to paced rhythm No significant change since  last tracing Confirmed by [facility] MD, Barbara Cower [phone]) on 02/12/2016 11:43:52  AM      MDM   Final diagnoses:  SOB (shortness of breath)    70 yo M w/ sob, likely mild copd exacerbation. Albuterol helped. Mild cp over site of defibrillator without e/o infection. ecg ok. Delta troponins at baseline, doubt ACS. PE study negative. Plan for dc on albuterol.   New Prescriptions: New Prescriptions   ALBUTEROL (PROVENTIL HFA;VENTOLIN HFA) 108 (90 BASE) MCG/ACT INHALER    Inhale 2 puffs into the lungs every 6 (six) hours as needed for wheezing or shortness of breath.   POTASSIUM CHLORIDE SA (K-DUR,KLOR-CON) 20 MEQ TABLET    Take 2 tablets (40 mEq total) by mouth 2 (two) times daily.     I have personally and contemperaneously reviewed labs and imaging and used in my decision making as above.   A medical screening exam was performed and I feel the patient has had an appropriate workup for their chief complaint at this time and likelihood of emergent condition existing is low and thus workup can continue on an outpatient basis.. Their vital signs are stable. They have been counseled on decision, discharge, follow up and which symptoms necessitate immediate return to the emergency department.  They verbally stated understanding and agreement with plan and discharged in stable condition.   I personally performed the services described in this documentation, which was scribed in my presence. The recorded information has been reviewed and is accurate.  Johnathan Memos, MD 02/12/16 [phone]

## 2016-02-12 NOTE — ED Notes (Signed)
Heart palpations started last, rates pain 9/10.  C/o SOB.  Pain to left chest.

## 2016-02-12 NOTE — ED Notes (Signed)
CRITICAL VALUE ALERT  Critical value received: trponin 0.03  Date of notification:  02/12/16/  Time of notification: 1555  Critical value read back:yes  Nurse who received alert: Tonia Ghent RN  MD notified  Mesner

## 2016-02-12 NOTE — Care Management (Signed)
Patient new to area. CM consulted regarding new PCP. Patient states he already has PCP appointment next week with Dr. Felecia Shelling. He reports wanting information regarding medicaid. Patient currently has medicare. Spoke with [facility], who will talk with patient. Will sign off.

## 2016-02-13 ENCOUNTER — Encounter (HOSPITAL_COMMUNITY): Payer: Self-pay | Admitting: Emergency Medicine

## 2016-02-13 ENCOUNTER — Emergency Department (HOSPITAL_COMMUNITY)
Admission: EM | Admit: 2016-02-13 | Discharge: 2016-02-13 | Disposition: A | Discharge status: Home / Self Care | Payer: Medicare Other | Attending: Emergency Medicine | Admitting: Emergency Medicine

## 2016-02-13 DIAGNOSIS — Z7982 Long term (current) use of aspirin: Secondary | ICD-10-CM | POA: Diagnosis not present

## 2016-02-13 DIAGNOSIS — I509 Heart failure, unspecified: Secondary | ICD-10-CM | POA: Diagnosis not present

## 2016-02-13 DIAGNOSIS — Z7901 Long term (current) use of anticoagulants: Secondary | ICD-10-CM | POA: Diagnosis not present

## 2016-02-13 DIAGNOSIS — I110 Hypertensive heart disease with heart failure: Secondary | ICD-10-CM | POA: Insufficient documentation

## 2016-02-13 DIAGNOSIS — R079 Chest pain, unspecified: Secondary | ICD-10-CM | POA: Insufficient documentation

## 2016-02-13 DIAGNOSIS — R5383 Other fatigue: Secondary | ICD-10-CM | POA: Diagnosis present

## 2016-02-13 DIAGNOSIS — G4700 Insomnia, unspecified: Secondary | ICD-10-CM

## 2016-02-13 NOTE — ED Notes (Signed)
Pt states he was here yesterday for pain around his defibrillator and shortness of breath.  States he has no energy and cannot sleep.  Reports he has not been to sleep since leaving hospital yesterday.

## 2016-02-13 NOTE — ED Notes (Signed)
Pt asleep at this time

## 2016-02-13 NOTE — Discharge Instructions (Signed)
Insomnia Insomnia is a sleep disorder that makes it difficult to fall asleep or to stay asleep. Insomnia can cause tiredness (fatigue), low energy, difficulty concentrating, mood swings, and poor performance at work or school.  There are three different ways to classify insomnia:  Difficulty falling asleep.  Difficulty staying asleep.  Waking up too early in the morning. Any type of insomnia can be long-term (chronic) or short-term (acute). Both are common. Short-term insomnia usually lasts for three months or less. Chronic insomnia occurs at least three times a week for longer than three months. CAUSES  Insomnia may be caused by another condition, situation, or substance, such as:  Anxiety.  Certain medicines.  Gastroesophageal reflux disease (GERD) or other gastrointestinal conditions.  Asthma or other breathing conditions.  Restless legs syndrome, sleep apnea, or other sleep disorders.  Chronic pain.  Menopause. This may include hot flashes.  Stroke.  Abuse of alcohol, tobacco, or illegal drugs.  Depression.  Caffeine.   Neurological disorders, such as Alzheimer disease.  An overactive thyroid (hyperthyroidism). The cause of insomnia may not be known. RISK FACTORS Risk factors for insomnia include:  Gender. Women are more commonly affected than men.  Age. Insomnia is more common as you get older.  Stress. This may involve your professional or personal life.  Income. Insomnia is more common in people with lower income.  Lack of exercise.   Irregular work schedule or night shifts.  Traveling between different time zones. SIGNS AND SYMPTOMS If you have insomnia, trouble falling asleep or trouble staying asleep is the main symptom. This may lead to other symptoms, such as:  Feeling fatigued.  Feeling nervous about going to sleep.  Not feeling rested in the morning.  Having trouble concentrating.  Feeling irritable, anxious, or depressed. TREATMENT   Treatment for insomnia depends on the cause. If your insomnia is caused by an underlying condition, treatment will focus on addressing the condition. Treatment may also include:   Medicines to help you sleep.  Counseling or therapy.  Lifestyle adjustments. HOME CARE INSTRUCTIONS   Take medicines only as directed by your health care provider.  Keep regular sleeping and waking hours. Avoid naps.  Keep a sleep diary to help you and your health care provider figure out what could be causing your insomnia. Include:   When you sleep.  When you wake up during the night.  How well you sleep.   How rested you feel the next day.  Any side effects of medicines you are taking.  What you eat and drink.   Make your bedroom a comfortable place where it is easy to fall asleep:  Put up shades or special blackout curtains to block light from outside.  Use a white noise machine to block noise.  Keep the temperature cool.   Exercise regularly as directed by your health care provider. Avoid exercising right before bedtime.  Use relaxation techniques to manage stress. Ask your health care provider to suggest some techniques that may work well for you. These may include:  Breathing exercises.  Routines to release muscle tension.  Visualizing peaceful scenes.  Cut back on alcohol, caffeinated beverages, and cigarettes, especially close to bedtime. These can disrupt your sleep.  Do not overeat or eat spicy foods right before bedtime. This can lead to digestive discomfort that can make it hard for you to sleep.  Limit screen use before bedtime. This includes:  Watching TV.  Using your smartphone, tablet, and computer.  Stick to a routine. This   can help you fall asleep faster. Try to do a quiet activity, brush your teeth, and go to bed at the same time each night.  Get out of bed if you are still awake after 15 minutes of trying to sleep. Keep the lights down, but try reading or  doing a quiet activity. When you feel sleepy, go back to bed.  Make sure that you drive carefully. Avoid driving if you feel very sleepy.  Keep all follow-up appointments as directed by your health care provider. This is important. SEEK MEDICAL CARE IF:   You are tired throughout the day or have trouble in your daily routine due to sleepiness.  You continue to have sleep problems or your sleep problems get worse. SEEK IMMEDIATE MEDICAL CARE IF:   You have serious thoughts about hurting yourself or someone else.   This information is not intended to replace advice given to you by your health care provider. Make sure you discuss any questions you have with your health care provider.   Document Released: 07/29/2000 Document Revised: 04/22/2015 Document Reviewed: 05/02/2014 Elsevier Interactive Patient Education 2016 [employer]  

## 2016-02-13 NOTE — ED Notes (Signed)
Pt calling out to nurses station requesting to speak to doctor. Dr. Rubin Payor notified.

## 2016-02-13 NOTE — ED Notes (Signed)
EDP at bedside  

## 2016-02-13 NOTE — ED Provider Notes (Signed)
CSN: [id number]     Arrival date & time 02/13/16  4782 History   First MD Initiated Contact with Patient 02/13/16 [phone]     Chief Complaint  Patient presents with  . Fatigue      The history is provided by the patient.  Patient presents with fatigue and difficulty sleeping. States that he has been having difficulty sleeping since he was here yesterday. Seen yesterday for chest pain and some shortness of breath. No fevers. Occasional cough. He states his medicines are not helping him. He states he feels tired but his mind is racing. He states he has been on 3 new heart medicines recently.  Past Medical History  Diagnosis Date  . Seizures (HCC)   . Hypertension   . CHF (congestive heart failure) [facility])    Past Surgical History  Procedure Laterality Date  . Pacemaker insertion     History reviewed. No pertinent family history. Social History  Substance Use Topics  . Smoking status: Never Smoker   . Smokeless tobacco: None  . Alcohol Use: No    Review of Systems  Constitutional: Negative for activity change and appetite change.  Eyes: Negative for pain.  Respiratory: Negative for chest tightness and shortness of breath.   Cardiovascular: Positive for chest pain. Negative for leg swelling.  Gastrointestinal: Negative for nausea, vomiting, abdominal pain and diarrhea.  Genitourinary: Negative for flank pain.  Musculoskeletal: Negative for back pain and neck stiffness.  Skin: Negative for rash.  Neurological: Negative for speech difficulty, weakness, numbness and headaches.  Psychiatric/Behavioral: Negative for behavioral problems. The patient is nervous/anxious.       Allergies  Review of patient's allergies indicates no known allergies.  Home Medications   Prior to Admission medications   Medication Sig Start Date End Date Taking? Authorizing Provider  albuterol (PROVENTIL HFA;VENTOLIN HFA) 108 (90 Base) MCG/ACT inhaler Inhale 2 puffs into the lungs every 6 (six) hours as  needed for wheezing or shortness of breath. 02/12/16   [name], MD  aspirin EC 81 MG tablet Take 81 mg by mouth daily.    Historical Provider, MD  atorvastatin (LIPITOR) 40 MG tablet Take 1 tablet by mouth daily. 01/27/16   Historical Provider, MD  carvedilol (COREG) 12.5 MG tablet Take 12.5 mg by mouth 2 (two) times daily with a meal.    Historical Provider, MD  digoxin (LANOXIN) 0.125 MG tablet Take 0.125 mg by mouth daily.     Historical Provider, MD  esomeprazole (NEXIUM) 20 MG capsule Take 20 mg by mouth daily at 12 noon.    Historical Provider, MD  furosemide (LASIX) 40 MG tablet Take 2 po BID x 2-3 days until the fluid is gone then take 1 po QD Patient taking differently: Take 40 mg by mouth daily.  02/03/16   Devoria Albe, MD  hydrALAZINE (APRESOLINE) 25 MG tablet Take 25 mg by mouth 3 (three) times daily.    Historical Provider, MD  levETIRAcetam (KEPPRA) 500 MG tablet Take 500 mg by mouth 2 (two) times daily.    Historical Provider, MD  lisinopril (PRINIVIL,ZESTRIL) 2.5 MG tablet Take 2.5 mg by mouth 2 (two) times daily.    Historical Provider, MD  LORazepam (ATIVAN) 0.5 MG tablet Take 0.5 mg by mouth every 8 (eight) hours as needed for anxiety.    Historical Provider, MD  nitroGLYCERIN (NITROSTAT) 0.4 MG SL tablet Place 1 tablet (0.4 mg total) under the tongue every 5 (five) minutes as needed for chest pain. 02/03/16  Devoria Albe, MD  potassium chloride SA (K-DUR,KLOR-CON) 20 MEQ tablet Take 2 tablets (40 mEq total) by mouth 2 (two) times daily. 02/12/16 02/19/16  [name], MD  warfarin (COUMADIN) 5 MG tablet Take 5 mg by mouth daily.    Historical Provider, MD   BP 108/70 mmHg  Pulse 55  Temp(Src) 97.6 F (36.4 C) (Oral)  Resp 21  Ht 6' (1.829 m)  Wt 195 lb (88.451 kg)  BMI 26.44 kg/m2  SpO2 97% Physical Exam  Constitutional: He appears well-developed and well-nourished.  HENT:  Head: Atraumatic.  Eyes: EOM are normal.  Cardiovascular: Normal rate.   Pulmonary/Chest:   Pacemaker left chest wall.  Abdominal: Soft.  Musculoskeletal: Normal range of motion. He exhibits no edema.  Neurological: He is alert.    ED Course  Procedures (including critical care time) Labs Review Labs Reviewed - No data to display  Imaging Review Ct Angio Chest Pe W/cm &/or Wo Cm  02/12/2016  CLINICAL DATA:  LEFT side chest pain with shortness of breath for 1 day, history hypertension, pacemaker, CHF EXAM: CT ANGIOGRAPHY CHEST WITH CONTRAST TECHNIQUE: Multidetector CT imaging of the chest was performed using the standard protocol during bolus administration of intravenous contrast. Multiplanar CT image reconstructions and MIPs were obtained to evaluate the vascular anatomy. CONTRAST:  100 cc Isovue 370 IV COMPARISON:  None FINDINGS: Cardiovascular: Few scattered atherosclerotic calcifications aorta and coronary arteries. Upper normal caliber ascending thoracic aorta 3.8 cm diameter. Mild limitations of exam secondary to scattered respiratory motion artifacts. Enlarged central pulmonary arteries. Pulmonary arteries well opacified and grossly patent. No definite evidence of pulmonary embolism. Beam hardening artifacts from LEFT subclavian pacemaker leads in RIGHT atrium, RIGHT ventricle, and coronary sinus. Enlargement of cardiac chambers and cardiac size. Mediastinum/Nodes: Esophagus unremarkable. Scattered normal size mediastinal lymph nodes. Lungs/Pleura: Small RIGHT pleural effusion. Scattered respiratory motion artifacts. Minimal compressive atelectasis RIGHT lower lobe. Peripheral interstitial changes at both lung bases. No additional infiltrate, pneumothorax, or LEFT pleural effusion. Upper Abdomen: Distended IVC and hepatic veins with reflux of contrast question elevated RIGHT heart pressure versus sequela power injection. Remaining upper abdomen normal. Musculoskeletal: No acute osseous findings. Review of the MIP images confirms the above findings. IMPRESSION: No gross evidence of  pulmonary embolism identified on exam slightly limited by rash to motion artifacts. Question pulmonary arterial hypertension. Small RIGHT pleural effusion with minimal basilar atelectasis. Enlargement of cardiac chambers with evidence of prior pacemaker placement. Electronically Signed   By: Ulyses Southward M.D.   On: 02/12/2016 14:42   I have personally reviewed and evaluated these images and lab results as part of my medical decision-making.   EKG Interpretation   Date/Time:  Saturday February 13 2016 07:33:00 EDT Ventricular Rate:  65 PR Interval:    QRS Duration: 174 QT Interval:  442 QTC Calculation: 460 R Axis:   -97 Text Interpretation:  Atrial-ventricular dual-paced complexes No further  analysis attempted due to paced rhythm Confirmed by Rubin Payor  MD, Harrold Donath  [phone]) on 02/13/2016 7:46:14 AM      MDM   Final diagnoses:  Insomnia    Patient with insomnia. States he's not able to sleep at night. States his heart starts to race and then he feels as if he has trouble breathing. States the medicines are helping. Lab work reviewed from the last few days. Patient is artery on trazodone and alprazolam. Will have him take these medicines together before bedtime. I doubt any risk with this since he is artery on these  medicines. Will discharge home to follow-up on Wednesday with his new primary care doctor as planned. Dr Felecia Shelling. It does not appear to be dyspnea as the cause of the difficulty sleeping.    Benjiman Core, MD 02/13/16 [phone]

## 2016-02-17 ENCOUNTER — Other Ambulatory Visit: Payer: Self-pay | Admitting: *Deleted

## 2016-02-17 DIAGNOSIS — I509 Heart failure, unspecified: Secondary | ICD-10-CM

## 2016-02-18 ENCOUNTER — Other Ambulatory Visit: Payer: Medicare Other

## 2016-02-23 ENCOUNTER — Inpatient Hospital Stay (HOSPITAL_COMMUNITY): Payer: Medicare Other

## 2016-02-23 ENCOUNTER — Emergency Department (HOSPITAL_COMMUNITY): Payer: Medicare Other

## 2016-02-23 ENCOUNTER — Encounter (HOSPITAL_COMMUNITY): Payer: Self-pay | Admitting: Emergency Medicine

## 2016-02-23 ENCOUNTER — Inpatient Hospital Stay (HOSPITAL_COMMUNITY)
Admission: EM | Admit: 2016-02-23 | Discharge: 2016-03-01 | DRG: 286 | Disposition: A | Discharge status: Home / Self Care | Payer: Medicare Other | Attending: Interventional Cardiology | Admitting: Interventional Cardiology

## 2016-02-23 DIAGNOSIS — I130 Hypertensive heart and chronic kidney disease with heart failure and stage 1 through stage 4 chronic kidney disease, or unspecified chronic kidney disease: Secondary | ICD-10-CM | POA: Diagnosis present

## 2016-02-23 DIAGNOSIS — R569 Unspecified convulsions: Secondary | ICD-10-CM | POA: Diagnosis present

## 2016-02-23 DIAGNOSIS — I272 Other secondary pulmonary hypertension: Secondary | ICD-10-CM | POA: Diagnosis present

## 2016-02-23 DIAGNOSIS — Z950 Presence of cardiac pacemaker: Secondary | ICD-10-CM | POA: Diagnosis not present

## 2016-02-23 DIAGNOSIS — N183 Chronic kidney disease, stage 3 unspecified: Secondary | ICD-10-CM | POA: Diagnosis present

## 2016-02-23 DIAGNOSIS — K219 Gastro-esophageal reflux disease without esophagitis: Secondary | ICD-10-CM | POA: Diagnosis present

## 2016-02-23 DIAGNOSIS — I429 Cardiomyopathy, unspecified: Secondary | ICD-10-CM

## 2016-02-23 DIAGNOSIS — Z8673 Personal history of transient ischemic attack (TIA), and cerebral infarction without residual deficits: Secondary | ICD-10-CM | POA: Diagnosis not present

## 2016-02-23 DIAGNOSIS — Z7901 Long term (current) use of anticoagulants: Secondary | ICD-10-CM | POA: Diagnosis not present

## 2016-02-23 DIAGNOSIS — I519 Heart disease, unspecified: Secondary | ICD-10-CM | POA: Diagnosis not present

## 2016-02-23 DIAGNOSIS — I5023 Acute on chronic systolic (congestive) heart failure: Secondary | ICD-10-CM | POA: Diagnosis present

## 2016-02-23 DIAGNOSIS — Z7982 Long term (current) use of aspirin: Secondary | ICD-10-CM

## 2016-02-23 DIAGNOSIS — Z95810 Presence of automatic (implantable) cardiac defibrillator: Secondary | ICD-10-CM | POA: Diagnosis present

## 2016-02-23 DIAGNOSIS — J449 Chronic obstructive pulmonary disease, unspecified: Secondary | ICD-10-CM | POA: Diagnosis present

## 2016-02-23 DIAGNOSIS — N179 Acute kidney failure, unspecified: Secondary | ICD-10-CM | POA: Diagnosis present

## 2016-02-23 DIAGNOSIS — I480 Paroxysmal atrial fibrillation: Secondary | ICD-10-CM | POA: Diagnosis present

## 2016-02-23 DIAGNOSIS — Z8249 Family history of ischemic heart disease and other diseases of the circulatory system: Secondary | ICD-10-CM | POA: Diagnosis not present

## 2016-02-23 DIAGNOSIS — R300 Dysuria: Secondary | ICD-10-CM | POA: Diagnosis present

## 2016-02-23 DIAGNOSIS — I609 Nontraumatic subarachnoid hemorrhage, unspecified: Secondary | ICD-10-CM

## 2016-02-23 DIAGNOSIS — Z8669 Personal history of other diseases of the nervous system and sense organs: Secondary | ICD-10-CM | POA: Diagnosis not present

## 2016-02-23 DIAGNOSIS — I5189 Other ill-defined heart diseases: Secondary | ICD-10-CM | POA: Diagnosis present

## 2016-02-23 DIAGNOSIS — Z87891 Personal history of nicotine dependence: Secondary | ICD-10-CM | POA: Diagnosis not present

## 2016-02-23 DIAGNOSIS — Z79899 Other long term (current) drug therapy: Secondary | ICD-10-CM

## 2016-02-23 DIAGNOSIS — Z86711 Personal history of pulmonary embolism: Secondary | ICD-10-CM | POA: Diagnosis present

## 2016-02-23 DIAGNOSIS — I4891 Unspecified atrial fibrillation: Secondary | ICD-10-CM | POA: Diagnosis not present

## 2016-02-23 DIAGNOSIS — I472 Ventricular tachycardia: Secondary | ICD-10-CM

## 2016-02-23 DIAGNOSIS — I482 Chronic atrial fibrillation: Secondary | ICD-10-CM | POA: Diagnosis not present

## 2016-02-23 DIAGNOSIS — Z66 Do not resuscitate: Secondary | ICD-10-CM | POA: Diagnosis present

## 2016-02-23 DIAGNOSIS — I639 Cerebral infarction, unspecified: Secondary | ICD-10-CM | POA: Diagnosis present

## 2016-02-23 DIAGNOSIS — R109 Unspecified abdominal pain: Secondary | ICD-10-CM | POA: Diagnosis present

## 2016-02-23 DIAGNOSIS — I509 Heart failure, unspecified: Secondary | ICD-10-CM | POA: Diagnosis not present

## 2016-02-23 DIAGNOSIS — Z87898 Personal history of other specified conditions: Secondary | ICD-10-CM

## 2016-02-23 DIAGNOSIS — I4729 Other ventricular tachycardia: Secondary | ICD-10-CM

## 2016-02-23 DIAGNOSIS — I10 Essential (primary) hypertension: Secondary | ICD-10-CM | POA: Diagnosis present

## 2016-02-23 HISTORY — DX: Unspecified atrial fibrillation: I48.91

## 2016-02-23 HISTORY — DX: Chronic obstructive pulmonary disease, unspecified: J44.9

## 2016-02-23 HISTORY — DX: Chronic kidney disease, stage 3 (moderate): N18.3

## 2016-02-23 HISTORY — DX: Cardiomyopathy, unspecified: I42.9

## 2016-02-23 HISTORY — DX: Nontraumatic subarachnoid hemorrhage, unspecified: I60.9

## 2016-02-23 HISTORY — DX: Chronic kidney disease, stage 3 unspecified: N18.30

## 2016-02-23 HISTORY — DX: Chronic systolic (congestive) heart failure: I50.22

## 2016-02-23 HISTORY — DX: Other ill-defined heart diseases: I51.89

## 2016-02-23 HISTORY — DX: Other pulmonary embolism without acute cor pulmonale: I26.99

## 2016-02-23 HISTORY — DX: Cerebral infarction, unspecified: I63.9

## 2016-02-23 HISTORY — DX: Essential (primary) hypertension: I10

## 2016-02-23 HISTORY — DX: Pulmonary hypertension, unspecified: I27.20

## 2016-02-23 LAB — BASIC METABOLIC PANEL
ANION GAP: 5 (ref 5–15)
BUN: 20 mg/dL (ref 6–20)
CHLORIDE: 108 mmol/L (ref 101–111)
CO2: 25 mmol/L (ref 22–32)
Calcium: 9.3 mg/dL (ref 8.9–10.3)
Creatinine, Ser: 1.22 mg/dL (ref 0.61–1.24)
GFR calc non Af Amer: 59 mL/min — ABNORMAL LOW (ref >60)
GLUCOSE: 110 mg/dL — AB (ref 65–99)
POTASSIUM: 3.3 mmol/L — AB (ref 3.5–5.1)
Sodium: 138 mmol/L (ref 135–145)

## 2016-02-23 LAB — ECHOCARDIOGRAM COMPLETE
AVLVOTPG: 2 mmHg
CHL CUP DOP CALC LVOT VTI: 11 cm
CHL CUP MV DEC (S): 130
CHL CUP RV SYS PRESS: 45 mmHg
CHL CUP STROKE VOLUME: 20 mL
CHL CUP TV REG PEAK VELOCITY: 323 cm/s
E decel time: 130 msec
EERAT: 19.8
FS: 4 % — AB (ref 28–44)
Height: 72 in
IVS/LV PW RATIO, ED: 0.88
LA ID, A-P, ES: 50 mm
LA diam end sys: 50 mm
LA diam index: 2.35 cm/m2
LA vol A4C: 99.9 ml
LA vol index: 50.7 mL/m2
LA vol: 108 mL
LDCA: 3.46 cm2
LV E/e' medial: 19.8
LV PW d: 10.7 mm — AB (ref 0.6–1.1)
LV TDI E'LATERAL: 4.61
LV TDI E'MEDIAL: 4.28
LV dias vol index: 63 mL/m2
LV e' LATERAL: 4.61 cm/s
LV sys vol: 114 mL — AB (ref 21–61)
LVDIAVOL: 134 mL (ref 62–150)
LVEEAVG: 19.8
LVOT SV: 38 mL
LVOT diameter: 21 mm
LVOTPV: 72.5 cm/s
LVSYSVOLIN: 53 mL/m2
MV Peak grad: 3 mmHg
MV pk E vel: 91.3 m/s
MVPKAVEL: 27.6 m/s
PISA EROA: 0.08 cm2
PV Reg grad dias: 6 mmHg
PV Reg vel dias: 119 cm/s
Simpson's disk: 15
TAPSE: 15.8 mm
TRMAXVEL: 323 cm/s
VTI: 150 cm
WEIGHTICAEL: 3120 [oz_av]

## 2016-02-23 LAB — CBC WITH DIFFERENTIAL/PLATELET
BASOS ABS: 0 10*3/uL (ref 0.0–0.1)
BASOS PCT: 1 %
EOS ABS: 0.1 10*3/uL (ref 0.0–0.7)
EOS PCT: 2 %
HCT: 38.9 % — ABNORMAL LOW (ref 39.0–52.0)
Hemoglobin: 12.3 g/dL — ABNORMAL LOW (ref 13.0–17.0)
LYMPHS ABS: 1 10*3/uL (ref 0.7–4.0)
Lymphocytes Relative: 28 %
MCH: 30.7 pg (ref 26.0–34.0)
MCHC: 31.6 g/dL (ref 30.0–36.0)
MCV: 97 fL (ref 78.0–100.0)
Monocytes Absolute: 0.5 10*3/uL (ref 0.1–1.0)
Monocytes Relative: 16 %
Neutro Abs: 1.8 10*3/uL (ref 1.7–7.7)
Neutrophils Relative %: 53 %
PLATELETS: 195 10*3/uL (ref 150–400)
RBC: 4.01 MIL/uL — AB (ref 4.22–5.81)
RDW: 14.9 % (ref 11.5–15.5)
WBC: 3.4 10*3/uL — AB (ref 4.0–10.5)

## 2016-02-23 LAB — PROTIME-INR
INR: 1.96 — AB (ref 0.00–1.49)
PROTHROMBIN TIME: 22.2 s — AB (ref 11.6–15.2)

## 2016-02-23 LAB — DIGOXIN LEVEL: DIGOXIN LVL: 0.7 ng/mL — AB (ref 0.8–2.0)

## 2016-02-23 LAB — BRAIN NATRIURETIC PEPTIDE: B NATRIURETIC PEPTIDE 5: 2021 pg/mL — AB (ref 0.0–100.0)

## 2016-02-23 LAB — TROPONIN I
TROPONIN I: 0.05 ng/mL — AB (ref <0.03)
TROPONIN I: 0.04 ng/mL — AB (ref <0.03)
Troponin I: 0.05 ng/mL (ref <0.03)

## 2016-02-23 LAB — TSH: TSH: 0.138 u[IU]/mL — ABNORMAL LOW (ref 0.350–4.500)

## 2016-02-23 MED ORDER — ATORVASTATIN CALCIUM 40 MG PO TABS
40.0000 mg | ORAL_TABLET | Freq: Every day | ORAL | Status: DC
  Administered 2016-02-24 – 2016-03-01 (×7): 40 mg via ORAL
  Filled 2016-02-23 (×7): qty 1

## 2016-02-23 MED ORDER — DIGOXIN 125 MCG PO TABS
0.1250 mg | ORAL_TABLET | Freq: Every day | ORAL | Status: DC
  Administered 2016-02-24 – 2016-03-01 (×7): 0.125 mg via ORAL
  Filled 2016-02-23 (×7): qty 1

## 2016-02-23 MED ORDER — ALBUTEROL SULFATE HFA 108 (90 BASE) MCG/ACT IN AERS: 2.0000 | INHALATION_SPRAY | Freq: Four times a day (QID) | RESPIRATORY_TRACT | Status: DC | PRN

## 2016-02-23 MED ORDER — FUROSEMIDE 10 MG/ML IJ SOLN
40.0000 mg | Freq: Two times a day (BID) | INTRAMUSCULAR | Status: DC
Start: 2016-02-23 — End: 2016-02-26
  Administered 2016-02-23 – 2016-02-25 (×5): 40 mg via INTRAVENOUS
  Filled 2016-02-23 (×7): qty 4

## 2016-02-23 MED ORDER — LEVETIRACETAM 500 MG PO TABS
500.0000 mg | ORAL_TABLET | Freq: Every day | ORAL | Status: DC
  Administered 2016-02-24 – 2016-03-01 (×7): 500 mg via ORAL
  Filled 2016-02-23 (×7): qty 1

## 2016-02-23 MED ORDER — LORAZEPAM 0.5 MG PO TABS
0.5000 mg | ORAL_TABLET | Freq: Three times a day (TID) | ORAL | Status: DC | PRN
  Administered 2016-02-24 – 2016-02-29 (×3): 0.5 mg via ORAL
  Filled 2016-02-23 (×3): qty 1

## 2016-02-23 MED ORDER — ASPIRIN EC 81 MG PO TBEC
81.0000 mg | DELAYED_RELEASE_TABLET | Freq: Every day | ORAL | Status: DC
  Administered 2016-02-24: 81 mg via ORAL
  Filled 2016-02-23: qty 1

## 2016-02-23 MED ORDER — ACETAMINOPHEN 325 MG PO TABS
650.0000 mg | ORAL_TABLET | Freq: Four times a day (QID) | ORAL | Status: DC | PRN
  Administered 2016-02-23 – 2016-02-25 (×3): 650 mg via ORAL
  Filled 2016-02-23 (×3): qty 2

## 2016-02-23 MED ORDER — LORAZEPAM 1 MG PO TABS
1.0000 mg | ORAL_TABLET | Freq: Every day | ORAL | Status: DC
  Administered 2016-02-23 – 2016-02-29 (×7): 1 mg via ORAL
  Filled 2016-02-23 (×7): qty 1

## 2016-02-23 MED ORDER — HYDRALAZINE HCL 25 MG PO TABS
25.0000 mg | ORAL_TABLET | Freq: Three times a day (TID) | ORAL | Status: DC
  Administered 2016-02-23 – 2016-03-01 (×18): 25 mg via ORAL
  Filled 2016-02-23 (×20): qty 1

## 2016-02-23 MED ORDER — ALBUTEROL SULFATE (2.5 MG/3ML) 0.083% IN NEBU
2.5000 mg | INHALATION_SOLUTION | Freq: Four times a day (QID) | RESPIRATORY_TRACT | Status: DC | PRN
  Filled 2016-02-23: qty 3

## 2016-02-23 MED ORDER — FUROSEMIDE 10 MG/ML IJ SOLN
80.0000 mg | Freq: Once | INTRAMUSCULAR | Status: AC
  Administered 2016-02-23: 80 mg via INTRAVENOUS
  Filled 2016-02-23: qty 8

## 2016-02-23 MED ORDER — NITROGLYCERIN 2 % TD OINT
1.0000 [in_us] | TOPICAL_OINTMENT | Freq: Three times a day (TID) | TRANSDERMAL | Status: DC
  Administered 2016-02-23 – 2016-02-25 (×6): 1 [in_us] via TOPICAL
  Filled 2016-02-23: qty 1
  Filled 2016-02-23 (×12): qty 30
  Filled 2016-02-23: qty 1
  Filled 2016-02-23 (×9): qty 30
  Filled 2016-02-23: qty 1
  Filled 2016-02-23: qty 30
  Filled 2016-02-23: qty 1

## 2016-02-23 MED ORDER — SODIUM CHLORIDE 0.9% FLUSH
3.0000 mL | Freq: Two times a day (BID) | INTRAVENOUS | Status: DC
  Administered 2016-02-23 – 2016-02-26 (×6): 3 mL via INTRAVENOUS

## 2016-02-23 MED ORDER — WARFARIN - PHARMACIST DOSING INPATIENT
Freq: Every day | Status: DC
  Administered 2016-02-26 – 2016-02-28 (×3)

## 2016-02-23 MED ORDER — CARVEDILOL 12.5 MG PO TABS
12.5000 mg | ORAL_TABLET | Freq: Two times a day (BID) | ORAL | Status: DC
  Administered 2016-02-23 – 2016-02-24 (×3): 12.5 mg via ORAL
  Filled 2016-02-23 (×4): qty 1

## 2016-02-23 MED ORDER — LISINOPRIL 2.5 MG PO TABS
2.5000 mg | ORAL_TABLET | Freq: Two times a day (BID) | ORAL | Status: DC
  Administered 2016-02-23 – 2016-02-24 (×3): 2.5 mg via ORAL
  Filled 2016-02-23 (×4): qty 1

## 2016-02-23 MED ORDER — PANTOPRAZOLE SODIUM 40 MG PO TBEC
40.0000 mg | DELAYED_RELEASE_TABLET | Freq: Every day | ORAL | Status: DC
  Administered 2016-02-24 – 2016-03-01 (×7): 40 mg via ORAL
  Filled 2016-02-23 (×7): qty 1

## 2016-02-23 MED ORDER — POTASSIUM CHLORIDE CRYS ER 20 MEQ PO TBCR
40.0000 meq | EXTENDED_RELEASE_TABLET | Freq: Two times a day (BID) | ORAL | Status: DC
  Administered 2016-02-23 – 2016-02-27 (×10): 40 meq via ORAL
  Filled 2016-02-23 (×10): qty 2

## 2016-02-23 NOTE — H&P (Signed)
[facility] History and Physical  Johnathan Morrison WUJ:[id number] DOB: September 11, 1945    PCP:   [facility]   Chief Complaint: SOB.  HPI: Johnathan Morrison is an 70 y.o. male just moved here from [place], and is new to our system, with hx of afib on coumadin, severe systolic CHF, HTN, hx of Seizure on Keppra, s/p PPM and defibrillator placement, presented to the ER with SOB and orthopnea.  He has an appointment with Dr Diona Browner this Thursday as initial consultation.  He was seen in the ER three times this week for SOB.  He denied fever, chills or CP.  Evaluation in the ER showed CXR with vascular congestion, EKG showed paced rhythm, and BNP in the 2000's.  His Cr was 1.22, K of 3.0, and troponins of 0.05.  He was given IV lasix, and hospitalist was asked to admit him for CHF.   Rewiew of Systems:  Constitutional: Negative for malaise, fever and chills. No significant weight loss or weight gain Eyes: Negative for eye pain, redness and discharge, diplopia, visual changes, or flashes of light. ENMT: Negative for ear pain, hoarseness, nasal congestion, sinus pressure and sore throat. No headaches; tinnitus, drooling, or problem swallowing. Cardiovascular: Negative for chest pain, palpitations, diaphoresis,  and peripheral edema.  Respiratory: Negative for cough, hemoptysis, wheezing and stridor. No pleuritic chestpain. Gastrointestinal: Negative for nausea, vomiting, diarrhea, constipation, abdominal pain, melena, blood in stool, hematemesis, jaundice and rectal bleeding.    Genitourinary: Negative for frequency, dysuria, incontinence,flank pain and hematuria; Musculoskeletal: Negative for back pain and neck pain. Negative for swelling and trauma.;  Skin: . Negative for pruritus, rash, abrasions, bruising and skin lesion.; ulcerations Neuro: Negative for headache, lightheadedness and neck stiffness. Negative for weakness, altered level of consciousness , altered mental status,  extremity weakness, burning feet, involuntary movement, seizure and syncope.  Psych: negative for anxiety, depression, insomnia, tearfulness, panic attacks, hallucinations, paranoia, suicidal or homicidal ideation    Past Medical History  Diagnosis Date  . Seizures (HCC)   . Hypertension   . CHF (congestive heart failure) [facility])     Past Surgical History  Procedure Laterality Date  . Pacemaker insertion      Medications:  HOME MEDS: Prior to Admission medications   Medication Sig Start Date End Date Taking? Authorizing Provider  acetaminophen (TYLENOL) 500 MG tablet Take 1,000 mg by mouth every 6 (six) hours as needed.   Yes Historical Provider, MD  albuterol (PROVENTIL HFA;VENTOLIN HFA) 108 (90 Base) MCG/ACT inhaler Inhale 2 puffs into the lungs every 6 (six) hours as needed for wheezing or shortness of breath. 02/12/16  Yes [name], MD  aspirin EC 81 MG tablet Take 81 mg by mouth daily.   Yes Historical Provider, MD  atorvastatin (LIPITOR) 40 MG tablet Take 1 tablet by mouth daily. 01/27/16  Yes Historical Provider, MD  carvedilol (COREG) 12.5 MG tablet Take 12.5 mg by mouth 2 (two) times daily with a meal.   Yes Historical Provider, MD  digoxin (LANOXIN) 0.125 MG tablet Take 0.125 mg by mouth daily.    Yes Historical Provider, MD  esomeprazole (NEXIUM) 20 MG capsule Take 20 mg by mouth daily at 12 noon.   Yes Historical Provider, MD  furosemide (LASIX) 40 MG tablet Take 2 po BID x 2-3 days until the fluid is gone then take 1 po QD Patient taking differently: Take 40 mg by mouth daily.  02/03/16  Yes Devoria Albe, MD  hydrALAZINE (APRESOLINE) 25 MG tablet Take 25 mg  by mouth 3 (three) times daily.   Yes Historical Provider, MD  levETIRAcetam (KEPPRA) 500 MG tablet Take 500 mg by mouth daily.    Yes Historical Provider, MD  lisinopril (PRINIVIL,ZESTRIL) 2.5 MG tablet Take 2.5 mg by mouth 2 (two) times daily.   Yes Historical Provider, MD  LORazepam (ATIVAN) 0.5 MG tablet Take 0.5 mg  by mouth every 8 (eight) hours as needed for anxiety. Reported on 02/23/2016   Yes Historical Provider, MD  potassium chloride (K-DUR) 10 MEQ tablet Take 20 mEq by mouth 2 (two) times daily.   Yes Historical Provider, MD  warfarin (COUMADIN) 5 MG tablet Take 5 mg by mouth daily.   Yes Historical Provider, MD  nitroGLYCERIN (NITROSTAT) 0.4 MG SL tablet Place 1 tablet (0.4 mg total) under the tongue every 5 (five) minutes as needed for chest pain. 02/03/16   Devoria Albe, MD     Allergies:  No Known Allergies  Social History:   reports that he has never smoked. He does not have any smokeless tobacco history on file. He reports that he does not drink alcohol or use illicit drugs.  Family History: History reviewed. No pertinent family history.   Physical Exam: Filed Vitals:   02/23/16 1330 02/23/16 1400 02/23/16 1516 02/23/16 1608  BP: 120/94 100/60 125/77 127/84  Pulse: 80  106 62  Temp:    97.6 F (36.4 C)  TempSrc:    Oral  Resp: 26 20 18 20   Height:    6' (1.829 m)  Weight:    88.043 kg (194 lb 1.6 oz)  SpO2: 95% 94% 98% 97%   Blood pressure 127/84, pulse 62, temperature 97.6 F (36.4 C), temperature source Oral, resp. rate 20, height 6' (1.829 m), weight 88.043 kg (194 lb 1.6 oz), SpO2 97 %.  GEN:  Pleasant  patient lying in the stretcher in no acute distress; cooperative with exam. PSYCH:  alert and oriented x4; does not appear anxious or depressed; affect is appropriate. HEENT: Mucous membranes pink and anicteric; PERRLA; EOM intact; no cervical lymphadenopathy nor thyromegaly or carotid bruit; no JVD; There were no stridor. Neck is very supple. Breasts:: Not examined CHEST WALL: No tenderness CHEST: Normal respiration, clear to auscultation bilaterally.  HEART: Regular rate and rhythm.  There are no murmur, rub, or gallops.   BACK: No kyphosis or scoliosis; no CVA tenderness ABDOMEN: soft and non-tender; no masses, no organomegaly, normal abdominal bowel sounds; no pannus; no  intertriginous candida. There is no rebound and no distention. Rectal Exam: Not done EXTREMITIES: No bone or joint deformity; age-appropriate arthropathy of the hands and knees; no edema; no ulcerations.  There is no calf tenderness. Genitalia: not examined PULSES: 2+ and symmetric SKIN: Normal hydration no rash or ulceration CNS: Cranial nerves 2-12 grossly intact no focal lateralizing neurologic deficit.  Speech is fluent; uvula elevated with phonation, facial symmetry and tongue midline. DTR are normal bilaterally, cerebella exam is intact, barbinski is negative and strengths are equaled bilaterally.  No sensory loss.   Labs on Admission:  Basic Metabolic Panel:  Recent Labs Lab 02/23/16 0836  NA 138  K 3.3*  CL 108  CO2 25  GLUCOSE 110*  BUN 20  CREATININE 1.22  CALCIUM 9.3   CBC:  Recent Labs Lab 02/23/16 0836  WBC 3.4*  NEUTROABS 1.8  HGB 12.3*  HCT 38.9*  MCV 97.0  PLT 195   Cardiac Enzymes:  Recent Labs Lab 02/23/16 0836 02/23/16 1637  TROPONINI 0.05* 0.05*  Radiological Exams on Admission: Dg Chest 2 View  02/23/2016  CLINICAL DATA:  Shortness of breath for 4 days, worsened over the past 2 days. History of congestive heart failure. EXAM: CHEST  2 VIEW COMPARISON:  Single-view of the chest 02/03/2016. CT chest 02/12/2016. FINDINGS: There is cardiomegaly without pulmonary edema. Trace right pleural effusion is seen and there is mild subsegmental atelectasis in the right lung base. Aeration is markedly improved since the prior plain film. AICD is noted. No pneumothorax. IMPRESSION: Cardiomegaly and pulmonary vascular congestion. Aeration appears markedly improved since the prior plain film. Trace right pleural effusion and subsegmental atelectasis right lung base. Electronically Signed   By: Drusilla Kanner M.D.   On: 02/23/2016 09:04    EKG: Independently reviewed.  PLAN:  CHF:  I suspect he has severe combined acute on chronic CHF.  He will need to get  diuresed.  I will obtain ECHO and give NTP to his chest wall.  Will consult cardiology as he will likely need a local cardiologist here as well.  I told him and his son Shanon Ace that he has severe congestive heart failure by history alone.  Will obtain old record from [place] as soon as possible.  Will continue him with his cardiac meds, including Digitalis.  Level is 0.7.  Will cycle his troponin and keep I and O and daily weight.  I told him and his son that he is quite ill.  They said they knew that already, and after patient confirmed that he would like to be DNR.  We will honor his wishes.  His son is in agreement.   Afib:  His rhythm is paced.  Will continue anticoagulation with Coumadin per pharmacy.  S/p PPM and defibrillator:  Pacing as expected.   Seizure:  Continue Keppra.  I told him Ultram can lower seizure threshold.  He will not continue with Ultram.   Other plans as per orders. Code Status: Eartha Inch, MD. FACP [facility] Pager [phone] 7pm to 7am.  02/23/2016, 5:58 PM

## 2016-02-23 NOTE — ED Notes (Signed)
MD at bedside. 

## 2016-02-23 NOTE — Progress Notes (Signed)
*  PRELIMINARY RESULTS* Echocardiogram 2D Echocardiogram has been performed.  Johnathan Morrison 02/23/2016, 4:51 PM

## 2016-02-23 NOTE — ED Notes (Signed)
Pt c/o persistent SOB x 1 week. States he was sent over by PCP for evaluation. Pt has pacemaker/defib, but denies any firing of defib. Denies any CP. Pt appears to be in NAD.

## 2016-02-23 NOTE — ED Notes (Signed)
CRITICAL VALUE ALERT  Critical value received:  Troponin  Date of notification:  02/23/16  Time of notification:  0920  Critical value read back:Yes.    Nurse who received alert:  RMinter, RN  MD notified (1st page):  Dr. Verdie Mosher  Time of first page:  0920  MD notified (2nd page):  Time of second page:  Responding MD:  Dr. Verdie Mosher  Time MD responded:  [phone]

## 2016-02-23 NOTE — Progress Notes (Signed)
CTSP re:  Syncope.  Patient had SVT at rate 160, and reportedly had LOC.  No defibrillation was fired.  He did not have any seizure activity. He is now alert, orient, and with no complaints. He said he had these episodes before.  Will continue to monitor.  Houston Siren, MD FACP. Hospital Medicine.

## 2016-02-23 NOTE — ED Provider Notes (Signed)
CSN: [id number]     Arrival date & time 02/23/16  4098 History  By signing my name below, I, [facility], attest that this documentation has been prepared under the direction and in the presence of Lavera Guise, MD. Electronically Signed: Randell Patient, ED Scribe. 02/23/2016. 9:49 AM.   Chief Complaint  Patient presents with  . Shortness of Breath    The history is provided by the patient. No language interpreter was used.   HPI Comments: Johnathan Morrison is a 70 y.o. male with a hx of HTN, CHF, and0 AICD/PPM insertion who presents to the Emergency Department complaining of recurrent, moderate SOB, worse over the past two days. Pt states that he was at his PCP's office this morning when he began to have difficulty breathing and was advised to come to the ED for further evaluation. He reports associated sleep disturbance secondary to discomfort, bilateral foot swelling, appetite loss, confusion, chest tightness, and cough productive of white phlegm. He has been sleeping upright over the past 4 days due to SOB. Compliant with medications. He notes similar symptoms in the past when his CHF was exacerbated and when he had pneumonia. Denies fevers, diaphoresis, or any other symptoms currently.  Past Medical History  Diagnosis Date  . Seizures (HCC)   . Hypertension   . CHF (congestive heart failure) [facility])    Past Surgical History  Procedure Laterality Date  . Pacemaker insertion     No family history on file. Social History  Substance Use Topics  . Smoking status: Never Smoker   . Smokeless tobacco: None  . Alcohol Use: No    Review of Systems  Constitutional: Positive for appetite change. Negative for fever and diaphoresis.  Respiratory: Positive for cough, chest tightness and shortness of breath.   Cardiovascular: Positive for leg swelling (bilateral feet).  Psychiatric/Behavioral: Positive for confusion (at night) and sleep disturbance.  All other systems reviewed  and are negative.     Allergies  Review of patient's allergies indicates no known allergies.  Home Medications   Prior to Admission medications   Medication Sig Start Date End Date Taking? Authorizing Provider  acetaminophen (TYLENOL) 500 MG tablet Take 1,000 mg by mouth every 6 (six) hours as needed.   Yes Historical Provider, MD  albuterol (PROVENTIL HFA;VENTOLIN HFA) 108 (90 Base) MCG/ACT inhaler Inhale 2 puffs into the lungs every 6 (six) hours as needed for wheezing or shortness of breath. 02/12/16  Yes [name], MD  aspirin EC 81 MG tablet Take 81 mg by mouth daily.   Yes Historical Provider, MD  atorvastatin (LIPITOR) 40 MG tablet Take 1 tablet by mouth daily. 01/27/16  Yes Historical Provider, MD  carvedilol (COREG) 12.5 MG tablet Take 12.5 mg by mouth 2 (two) times daily with a meal.   Yes Historical Provider, MD  digoxin (LANOXIN) 0.125 MG tablet Take 0.125 mg by mouth daily.    Yes Historical Provider, MD  esomeprazole (NEXIUM) 20 MG capsule Take 20 mg by mouth daily at 12 noon.   Yes Historical Provider, MD  furosemide (LASIX) 40 MG tablet Take 2 po BID x 2-3 days until the fluid is gone then take 1 po QD Patient taking differently: Take 40 mg by mouth daily.  02/03/16  Yes Devoria Albe, MD  hydrALAZINE (APRESOLINE) 25 MG tablet Take 25 mg by mouth 3 (three) times daily.   Yes Historical Provider, MD  levETIRAcetam (KEPPRA) 500 MG tablet Take 500 mg by mouth daily.    Yes  Historical Provider, MD  lisinopril (PRINIVIL,ZESTRIL) 2.5 MG tablet Take 2.5 mg by mouth 2 (two) times daily.   Yes Historical Provider, MD  LORazepam (ATIVAN) 0.5 MG tablet Take 0.5 mg by mouth every 8 (eight) hours as needed for anxiety. Reported on 02/23/2016   Yes Historical Provider, MD  potassium chloride SA (K-DUR,KLOR-CON) 20 MEQ tablet Take 2 tablets (40 mEq total) by mouth 2 (two) times daily. 02/12/16 02/23/16 Yes [name], MD  warfarin (COUMADIN) 5 MG tablet Take 5 mg by mouth daily.   Yes  Historical Provider, MD  nitroGLYCERIN (NITROSTAT) 0.4 MG SL tablet Place 1 tablet (0.4 mg total) under the tongue every 5 (five) minutes as needed for chest pain. 02/03/16   Devoria Albe, MD   BP 126/98 mmHg  Pulse 62  Temp(Src) 97.7 F (36.5 C) (Axillary)  Resp 24  Ht 6' (1.829 m)  Wt 195 lb (88.451 kg)  BMI 26.44 kg/m2  SpO2 96% Physical Exam Physical Exam  Nursing note and vitals reviewed. Constitutional: Well developed, well nourished, non-toxic, and in no acute distress Head: Normocephalic and atraumatic.  Mouth/Throat: Oropharynx is clear and moist.  Neck: Normal range of motion. Neck supple.  Cardiovascular: Normal rate and regular rhythm.  No significant BLE edema. Pulmonary/Chest: Mild tachypnea with mild conversational dyspnea, bronchospastic cough with bibasalar crackles.  Abdominal: Soft. There is no tenderness. There is no rebound and no guarding. Mild distension. Musculoskeletal: Normal range of motion.  Neurological: Alert, no facial droop, fluent speech, moves all extremities symmetrically Skin: Skin is warm and dry.  Psychiatric: Cooperative  ED Course  Procedures   DIAGNOSTIC STUDIES: Oxygen Saturation is 97% on RA, normal by my interpretation.    COORDINATION OF CARE: 8:29 AM Will order labs, EKG, and chest x-ray. Discussed treatment plan with pt at bedside and pt agreed to plan.  Labs Review Labs Reviewed  CBC WITH DIFFERENTIAL/PLATELET - Abnormal; Notable for the following:    WBC 3.4 (*)    RBC 4.01 (*)    Hemoglobin 12.3 (*)    HCT 38.9 (*)    All other components within normal limits  BASIC METABOLIC PANEL - Abnormal; Notable for the following:    Potassium 3.3 (*)    Glucose, Bld 110 (*)    GFR calc non Af Amer 59 (*)    All other components within normal limits  TROPONIN I - Abnormal; Notable for the following:    Troponin I 0.05 (*)    All other components within normal limits  BRAIN NATRIURETIC PEPTIDE - Abnormal; Notable for the following:     B Natriuretic Peptide 2021.0 (*)    All other components within normal limits  PROTIME-INR - Abnormal; Notable for the following:    Prothrombin Time 22.2 (*)    INR 1.96 (*)    All other components within normal limits  DIGOXIN LEVEL    Imaging Review Dg Chest 2 View  02/23/2016  CLINICAL DATA:  Shortness of breath for 4 days, worsened over the past 2 days. History of congestive heart failure. EXAM: CHEST  2 VIEW COMPARISON:  Single-view of the chest 02/03/2016. CT chest 02/12/2016. FINDINGS: There is cardiomegaly without pulmonary edema. Trace right pleural effusion is seen and there is mild subsegmental atelectasis in the right lung base. Aeration is markedly improved since the prior plain film. AICD is noted. No pneumothorax. IMPRESSION: Cardiomegaly and pulmonary vascular congestion. Aeration appears markedly improved since the prior plain film. Trace right pleural effusion and subsegmental atelectasis right  lung base. Electronically Signed   By: Drusilla Kanner M.D.   On: 02/23/2016 09:04   I have personally reviewed and evaluated these images and lab results as part of my medical decision-making.   EKG Interpretation   Date/Time:  Tuesday February 23 2016 05:69:79 EDT Ventricular Rate:  71 PR Interval:    QRS Duration: 136 QT Interval:  420 QTC Calculation: 457 R Axis:   -80 Text Interpretation:  Atrial-sensed ventricular-paced complexes No further  analysis attempted due to paced rhythm Baseline wander in lead(s) II III  aVF V1 Confirmed by Rosa Gambale MD, Akera Snowberger ([id number]) on 02/23/2016 9:41:39 AM      MDM   Final diagnoses:  Acute on chronic systolic congestive heart failure (HCC)    With CHF exacerbation. On room air With normal oxygenation, both some mild conversational dyspnea and tachypnea. Chest x-ray with pulmonary vascular congestion. BNP 2000. Troponin 0.05 with likely some mild strain from his CHF. EKG not acutely ischemic. Given 80 mg of IV Lasix. Discussed with Dr. Conley Rolls  from hospitalist service will admit for ongoing management.  I personally performed the services described in this documentation, which was scribed in my presence. The recorded information has been reviewed and is accurate.   Lavera Guise, MD 02/23/16 1007

## 2016-02-23 NOTE — Progress Notes (Signed)
ANTICOAGULATION CONSULT NOTE - Initial Consult  Pharmacy Consult for Coumadin Indication: atrial fibrillation  No Known Allergies  Patient Measurements: Height: 6' (182.9 cm) Weight: 195 lb (88.451 kg) IBW/kg (Calculated) : 77.6  Vital Signs: Temp: 97.7 F (36.5 C) (07/11 0830) Temp Source: Axillary (07/11 0830) BP: 109/69 mmHg (07/11 1230) Pulse Rate: 57 (07/11 1230)  Labs:  Recent Labs  02/23/16 0836 02/23/16 0946  HGB 12.3*  --   HCT 38.9*  --   PLT 195  --   LABPROT  --  22.2*  INR  --  1.96*  CREATININE 1.22  --   TROPONINI 0.05*  --     Estimated Creatinine Clearance: 63.6 mL/min (by C-G formula based on Cr of 1.22).   Medical History: Past Medical History  Diagnosis Date  . Seizures (HCC)   . Hypertension   . CHF (congestive heart failure) (HCC)     Medications:  See med rec  Assessment: 70 y.o. male with a hx of HTN, CHF, and0 AICD/PPM insertion who presents to the Emergency Department complaining of recurrent, moderate SOB, worse over the past two days.  Pharmacy consulted to continue dosing of coumadin. Current INR 1.96, patient took dose this AM at 0630.  Goal of Therapy:  INR 2-3 Monitor platelets by anticoagulation protocol: Yes   Plan:  No coumadin today, already taken Daily PT/INR Monitor for s/s of bleeding  Elder Cyphers, BS Loura Back, BCPS Clinical Pharmacist Pager [phone] 02/23/2016,1:13 PM

## 2016-02-23 NOTE — Progress Notes (Signed)
Late entry:  Made aware by CCMD of SVTat 16:42.  Arrived to room with an echocardiogram in progress.  Echo tech stated that he had noted and increased heart rate then the pt passed out and was apneic for several seconds.  Pt currently alert and able to respond to questions.  HR in 70s and SpO2 95%RA.  Paged Dr. Conley Rolls who arrived quickly to bedside.  Pt states that he has had similar episodes at home, usually at night.

## 2016-02-23 NOTE — ED Notes (Signed)
Pt given meal tray.

## 2016-02-24 ENCOUNTER — Encounter (HOSPITAL_COMMUNITY): Payer: Self-pay | Admitting: Cardiology

## 2016-02-24 DIAGNOSIS — I519 Heart disease, unspecified: Secondary | ICD-10-CM

## 2016-02-24 DIAGNOSIS — N183 Chronic kidney disease, stage 3 unspecified: Secondary | ICD-10-CM | POA: Diagnosis present

## 2016-02-24 DIAGNOSIS — Z7901 Long term (current) use of anticoagulants: Secondary | ICD-10-CM

## 2016-02-24 DIAGNOSIS — Z95810 Presence of automatic (implantable) cardiac defibrillator: Secondary | ICD-10-CM

## 2016-02-24 DIAGNOSIS — Z86711 Personal history of pulmonary embolism: Secondary | ICD-10-CM | POA: Diagnosis present

## 2016-02-24 DIAGNOSIS — I429 Cardiomyopathy, unspecified: Secondary | ICD-10-CM

## 2016-02-24 DIAGNOSIS — J449 Chronic obstructive pulmonary disease, unspecified: Secondary | ICD-10-CM | POA: Diagnosis present

## 2016-02-24 DIAGNOSIS — I272 Other secondary pulmonary hypertension: Secondary | ICD-10-CM

## 2016-02-24 DIAGNOSIS — I472 Ventricular tachycardia: Secondary | ICD-10-CM

## 2016-02-24 DIAGNOSIS — I5023 Acute on chronic systolic (congestive) heart failure: Secondary | ICD-10-CM | POA: Insufficient documentation

## 2016-02-24 DIAGNOSIS — I5189 Other ill-defined heart diseases: Secondary | ICD-10-CM | POA: Diagnosis present

## 2016-02-24 DIAGNOSIS — I4891 Unspecified atrial fibrillation: Secondary | ICD-10-CM

## 2016-02-24 DIAGNOSIS — I4729 Other ventricular tachycardia: Secondary | ICD-10-CM

## 2016-02-24 LAB — CBC
HCT: 38.6 % — ABNORMAL LOW (ref 39.0–52.0)
Hemoglobin: 12.3 g/dL — ABNORMAL LOW (ref 13.0–17.0)
MCH: 30.3 pg (ref 26.0–34.0)
MCHC: 31.9 g/dL (ref 30.0–36.0)
MCV: 95.1 fL (ref 78.0–100.0)
PLATELETS: 175 10*3/uL (ref 150–400)
RBC: 4.06 MIL/uL — AB (ref 4.22–5.81)
RDW: 14.5 % (ref 11.5–15.5)
WBC: 3.2 10*3/uL — ABNORMAL LOW (ref 4.0–10.5)

## 2016-02-24 LAB — BASIC METABOLIC PANEL
Anion gap: 9 (ref 5–15)
BUN: 19 mg/dL (ref 6–20)
CHLORIDE: 106 mmol/L (ref 101–111)
CO2: 26 mmol/L (ref 22–32)
CREATININE: 1.25 mg/dL — AB (ref 0.61–1.24)
Calcium: 9.1 mg/dL (ref 8.9–10.3)
GFR calc Af Amer: >60 mL/min (ref >60)
GFR calc non Af Amer: 57 mL/min — ABNORMAL LOW (ref >60)
Glucose, Bld: 88 mg/dL (ref 65–99)
Potassium: 3.4 mmol/L — ABNORMAL LOW (ref 3.5–5.1)
SODIUM: 141 mmol/L (ref 135–145)

## 2016-02-24 LAB — MRSA PCR SCREENING: MRSA by PCR: NEGATIVE

## 2016-02-24 LAB — TROPONIN I: TROPONIN I: 0.04 ng/mL — AB (ref <0.03)

## 2016-02-24 LAB — PROTIME-INR
INR: 1.54 — ABNORMAL HIGH (ref 0.00–1.49)
PROTHROMBIN TIME: 18.5 s — AB (ref 11.6–15.2)

## 2016-02-24 MED ORDER — WARFARIN SODIUM 5 MG PO TABS
5.0000 mg | ORAL_TABLET | Freq: Once | ORAL | Status: AC
  Administered 2016-02-24: 5 mg via ORAL
  Filled 2016-02-24: qty 1

## 2016-02-24 NOTE — Consult Note (Signed)
Reason for Consult:   CHF, NSVT  Requesting Physician: [facility], Dr. Kerry Hough  Primary Cardiologist: Dr Wilkie Aye [facility] [address]  [place], [address] [phone]  HPI:  70 y.o. AA male just moved here from [place] to be closer to his family, with multiple medical problems and new to our system.  He had an appointment with Dr Diona Browner this Thursday as initial consultation. 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 Most of the history of from records from [facility] hospitalizations (no records from his cardiologist). The pt was admitted last night to the ED at [facility] with SOB, His CXR showed vascular congestion and BNP 2021. He feels better this am after a 3L diuresis. He did have a run of 30 beats of NSVT- he says he defibrillator fired- ICD check shows two recent shocks. Records show he was on Amiodarone in Sept 2016-he is not sure why he is not on this now.   PMHx:  Past Medical History  Diagnosis Date  . Seizures (HCC)   . Essential hypertension   . Chronic systolic heart failure (HCC)   . Atrial fibrillation (HCC)   . Cardiomyopathy (HCC)   . PE (pulmonary embolism) March 2016  . COPD (chronic obstructive pulmonary disease) (HCC)   . Pulmonary HTN (HCC)     Rt heart cath June 2016  . Diastolic dysfunction     Grade 3  . 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 SAH (subarachnoid hemorrhage) (HCC) Dec 2015  . Chronic renal disease, stage III     Past Surgical History  Procedure Laterality Date  . Insertion of icd  09/25/12    Bi V St Jude   . Total knee arthroplasty Bilateral   . Ankle surgery    . Ivc filter placement ([facility]  hx)  March 2016  . Ivc filter removal ([facility] hx)  Oct 2016    SOCHx:  reports that he has never smoked. He does not have any smokeless tobacco history on file. He reports that he does not drink alcohol or use illicit drugs.  FAMHx: Family History  Problem Relation Age of Onset  . Cardiomyopathy    . Sudden death      ALLERGIES: No Known Allergies  ROS: Review of Systems: General: negative for chills, fever, night sweats or weight changes.  Cardiovascular: negative for chest pain, dyspnea on exertion, edema, orthopnea, palpitations, paroxysmal nocturnal dyspnea or shortness of breath HEENT: negative for any visual disturbances, blindness, glaucoma Dermatological: negative for rash Respiratory: negative for cough, hemoptysis, or wheezing Urologic: negative for hematuria or dysuria Abdominal: negative for nausea, vomiting, diarrhea, bright red blood per rectum, melena, or hematemesis Neurologic: negative for visual changes, syncope, or dizziness Musculoskeletal: negative for back pain, joint pain, or swelling Psych: cooperative and appropriate All other systems reviewed and are otherwise negative except as noted above.   HOME MEDICATIONS: Prior to Admission medications   Medication Sig Start Date End Date Taking? Authorizing Provider  acetaminophen (TYLENOL) 500 MG tablet Take 1,000 mg by mouth every 6 (six) hours as needed.   Yes Historical Provider, MD  albuterol (PROVENTIL HFA;VENTOLIN HFA) 108 (90 Base) MCG/ACT inhaler  Inhale 2 puffs into the lungs every 6 (six) hours as needed for wheezing or shortness of breath. 02/12/16  Yes [name], MD  aspirin EC 81 MG tablet Take 81 mg by mouth daily.   Yes Historical Provider, MD  atorvastatin (LIPITOR) 40 MG tablet Take 1 tablet by mouth daily. 01/27/16  Yes Historical Provider, MD  carvedilol (COREG) 12.5 MG tablet Take 12.5 mg by mouth 2 (two) times daily with a meal.   Yes Historical Provider, MD  digoxin (LANOXIN) 0.125 MG  tablet Take 0.125 mg by mouth daily.    Yes Historical Provider, MD  esomeprazole (NEXIUM) 20 MG capsule Take 20 mg by mouth daily at 12 noon.   Yes Historical Provider, MD  furosemide (LASIX) 40 MG tablet Take 2 po BID x 2-3 days until the fluid is gone then take 1 po QD Patient taking differently: Take 40 mg by mouth daily.  02/03/16  Yes Devoria Albe, MD  hydrALAZINE (APRESOLINE) 25 MG tablet Take 25 mg by mouth 3 (three) times daily.   Yes Historical Provider, MD  levETIRAcetam (KEPPRA) 500 MG tablet Take 500 mg by mouth daily.    Yes Historical Provider, MD  lisinopril (PRINIVIL,ZESTRIL) 2.5 MG tablet Take 2.5 mg by mouth 2 (two) times daily.   Yes Historical Provider, MD  LORazepam (ATIVAN) 0.5 MG tablet Take 0.5 mg by mouth every 8 (eight) hours as needed for anxiety. Reported on 02/23/2016   Yes Historical Provider, MD  potassium chloride (K-DUR) 10 MEQ tablet Take 20 mEq by mouth 2 (two) times daily.   Yes Historical Provider, MD  warfarin (COUMADIN) 5 MG tablet Take 5 mg by mouth daily.   Yes Historical Provider, MD  nitroGLYCERIN (NITROSTAT) 0.4 MG SL tablet Place 1 tablet (0.4 mg total) under the tongue every 5 (five) minutes as needed for chest pain. 02/03/16   Devoria Albe, MD    HOSPITAL MEDICATIONS: I have reviewed the patient's current medications.  VITALS: Blood pressure 98/56, pulse 71, temperature 98.1 F (36.7 C), temperature source Oral, resp. rate 18, height 6' (1.829 m), weight 187 lb 1.6 oz (84.868 kg), SpO2 95 %.  PHYSICAL EXAM: General appearance: alert, cooperative and no distress Neck: no carotid bruit and elevated JVP Lungs: crackles Lt base Heart: regular rate and rhythm Abdomen: soft, non-tender; bowel sounds normal; no masses,  no organomegaly Extremities: no edema Pulses: diminnished Skin: Skin color, texture, turgor normal. No rashes or lesions Neurologic: Grossly normal  LABS: Results for orders placed or performed during the hospital encounter of 02/23/16  (from the past 24 hour(s))  Troponin I     Status: Abnormal   Collection Time: 02/23/16  4:37 PM  Result Value Ref Range   Troponin I 0.05 (HH) <0.03 ng/mL  Troponin I     Status: Abnormal   Collection Time: 02/23/16  8:50 PM  Result Value Ref Range   Troponin I 0.04 (HH) <0.03 ng/mL  Troponin I     Status: Abnormal   Collection Time: 02/24/16  4:03 AM  Result Value Ref Range   Troponin I 0.04 (HH) <0.03 ng/mL  Basic metabolic panel     Status: Abnormal   Collection Time: 02/24/16  4:03 AM  Result Value Ref Range   Sodium 141 135 - 145 mmol/L   Potassium 3.4 (L) 3.5 - 5.1 mmol/L   Chloride 106 101 - 111 mmol/L   CO2 26 22 - 32 mmol/L   Glucose, Bld 88 65 - 99 mg/dL  BUN 19 6 - 20 mg/dL   Creatinine, Ser 9.14 (H) 0.61 - 1.24 mg/dL   Calcium 9.1 8.9 - 78.2 mg/dL   GFR calc non Af Amer 57 (L) >60 mL/min   GFR calc Af Amer >60 >60 mL/min   Anion gap 9 5 - 15  CBC     Status: Abnormal   Collection Time: 02/24/16  4:03 AM  Result Value Ref Range   WBC 3.2 (L) 4.0 - 10.5 K/uL   RBC 4.06 (L) 4.22 - 5.81 MIL/uL   Hemoglobin 12.3 (L) 13.0 - 17.0 g/dL   HCT 95.6 (L) 21.3 - 08.6 %   MCV 95.1 78.0 - 100.0 fL   MCH 30.3 26.0 - 34.0 pg   MCHC 31.9 30.0 - 36.0 g/dL   RDW 57.8 46.9 - 62.9 %   Platelets 175 150 - 400 K/uL  Protime-INR     Status: Abnormal   Collection Time: 02/24/16  4:03 AM  Result Value Ref Range   Prothrombin Time 18.5 (H) 11.6 - 15.2 seconds   INR 1.54 (H) 0.00 - 1.49    EKG: V Paced  IMAGING: Dg Chest 2 View  02/23/2016  CLINICAL DATA:  Shortness of breath for 4 days, worsened over the past 2 days. History of congestive heart failure. EXAM: CHEST  2 VIEW COMPARISON:  Single-view of the chest 02/03/2016. CT chest 02/12/2016. FINDINGS: There is cardiomegaly without pulmonary edema. Trace right pleural effusion is seen and there is mild subsegmental atelectasis in the right lung base. Aeration is markedly improved since the prior plain film. AICD is noted. No  pneumothorax. IMPRESSION: Cardiomegaly and pulmonary vascular congestion. Aeration appears markedly improved since the prior plain film. Trace right pleural effusion and subsegmental atelectasis right lung base. Electronically Signed   By: Drusilla Kanner M.D.   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 He was shocked once yesterday pm and once this am. Discussed with Dr Diona Browner, plan to transfer to [facility] for EP and CHF treatment (will take on our service). Discussed with pt and he is agreeable.   Corine Shelter PA-C 02/24/2016 11:24 AM    Attending note:  Patient seen and examined. Reviewed available records which are limited. Agree with above assessment by Mr. Lenn Cal. Mr. Ciresi is a medically complex 70 year old male with a history of cardiomyopathy, possibly nonischemic but details are not clear. He is status post biventricular ICD placement in February 2014 (nonresponder), has atrial fibrillation, pulmonary hypertension, and recurring hospitalizations last year in [place] associated with pulmonary  embolus, embolic stroke, and also heart failure exacerbations. Three weeks ago he  relocated from [facility] to [place] to be closer to family in this area. He reports having worsening dyspnea on exertion and fatigue associated with orthopnea over the last few months at least, has also had what sounds like fairly frequent device shocks and at least 2 episodes of syncope. He is currently admitted to the hospitalist service with acute on chronic combined heart failure exacerbation associated with 2 recent ICD shocks for VT based on interrogation.  On examination he reports no chest pain, sitting in bedside chair. Lungs exhibit diminished breath sounds at the bases with a few scattered rhonchi, cardiac exam reveals indistinct PMI with S3, he has elevated JVP, abdomen protuberant, also leg edema noted. Echocardiogram shows moderately dilated LV with LVEF 15-20%, severe diffuse hypokinesis with relative akinesis of the mid to apical inferior and inferolateral walls, restrictive diastolic filling pattern, moderate RV dysfunction, and PASP estimated 57 mmHg. Creatinine is 1.2, troponin I levels are minimally elevated in the range of 0.04-0.05, BNP 2021. ECG shows ventricular pacing. Pressure ranging 92-100 systolic, he has diuresed about 3 L since admission yesterday evening.  Patient presents with biventricular heart failure, possibly nonischemic cardiomyopathy based on incomplete information, nonresponder status post biventricular ICD placement in 2014, recurring  VT with device shocks, no definitive ACS based on cardiac markers. We have requested records from the patient's former cardiologist in [place]. He will be transferred to our cardiology service at [facility] for further evaluation and management by the Advanced Heart Failure team and also Electrophysiology. It looks like he was previously on amiodarone as of last year, not certain how long he has been off of it or why. He will most likely need to be  considered for follow-up left and right heart catheterization to better tailor his regimen based on hemodynamics and coronary anatomy, and probably back on amiodarone for suppression of VT unless there is some contraindication. Not certain about his candidacy for cardiac transplant or LVAD, but it sounds like he has been seen in the past for discussion of advanced modalities. He has end-stage disease and will need to formulate a plan closely with the CHF team.  Jonelle Sidle, M.D., F.A.C.C.

## 2016-02-24 NOTE — Progress Notes (Signed)
Pt's song [name] Erxleben informed of pt's transfer to [facility].

## 2016-02-24 NOTE — Progress Notes (Signed)
ANTICOAGULATION CONSULT NOTE - follow up  Pharmacy Consult for Coumadin Indication: atrial fibrillation  No Known Allergies  Patient Measurements: Height: 6' (182.9 cm) Weight: 187 lb 1.6 oz (84.868 kg) (nurse was told about the wt diff) IBW/kg (Calculated) : 77.6  Vital Signs: Temp: 98.1 F (36.7 C) (07/12 0848) Temp Source: Oral (07/12 0848) BP: 98/56 mmHg (07/12 0848) Pulse Rate: 71 (07/12 0848)  Labs:  Recent Labs  02/23/16 0836 02/23/16 0946 02/23/16 1637 02/23/16 2050 02/24/16 0403  HGB 12.3*  --   --   --  12.3*  HCT 38.9*  --   --   --  38.6*  PLT 195  --   --   --  175  LABPROT  --  22.2*  --   --  18.5*  INR  --  1.96*  --   --  1.54*  CREATININE 1.22  --   --   --  1.25*  TROPONINI 0.05*  --  0.05* 0.04* 0.04*    Estimated Creatinine Clearance: 62.1 mL/min (by C-G formula based on Cr of 1.25).   Medical History: Past Medical History  Diagnosis Date  . Seizures (HCC)   . Hypertension   . CHF (congestive heart failure) (HCC)   . Atrial fibrillation (HCC)   . Cardiomyopathy (HCC)     Medications:  See med rec  Assessment: 70 y.o. male with a hx of HTN, CHF, and0 AICD/PPM insertion who presents to the Emergency Department complaining of recurrent, moderate SOB, worse over the past two days.  Pharmacy consulted to continue dosing of coumadin. Home dose is 5mg  daily. INR 1.54 today.  Goal of Therapy:  INR 2-3 Monitor platelets by anticoagulation protocol: Yes   Plan:  Coumadin 5mg  x 1 today Daily PT/INR Monitor for s/s of bleeding  Elder Cyphers, BS Loura Back, BCPS Clinical Pharmacist Pager [phone] 02/24/2016,9:05 AM

## 2016-02-24 NOTE — Progress Notes (Signed)
PROGRESS NOTE    Johnathan Morrison  MBE:[id number] DOB: October 08, 1945 DOA: 02/23/2016 PCP: [facility]  Outpatient Specialists:   Brief Narrative: 56  yom with hx of afib on coumadin, severe systolic CHF, HTN, hx of Seizure on Keppra, s/p PPM and defibrillator placement, presented with complaints of SOB and orthopnea. Evaluation in the ED revealed CXR with vascular congestion and EKG with paced rhythm. Patient noted to have SVT with rates in the 160's. Plans to transfer to [facility] for further treatment.    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 Acute on chronic systolic CHF. Pt has evidence of volume overload. Volume status is -3.2 L since admission. Continue Iv Lasix. He is on BB and ACE-I 2. Atrial fibrillation, on chronic Coumadin. Currently rate controlled. Continue Coreq and Digoxin.  3. NSVT. Likely related to severe cardiomyopathy. Plan yo transfer to [facility] for EP evaluation and advanced heart failure team.  4. Essential HTN. BP stable. Continue current tx.  5. Hx of seizure. Continue Keppra.  6. CKD Stage III. Creatinine is stable. Continue to follow with diuresis.  7. COPD, stable.  8. Hx of PE.      Continue anticoagulation with Coumadin.                          DVT prophylaxis: Coumadin  Code Status: DNR Family Communication:  Disposition Plan: Transfer to [facility] under cardiology service.    Consultants:   Cardiology    Procedures:   None  Antimicrobials:   None    Subjective: Breathing with mild improvement. Otherwise no further complaints. Swelling in BLE chronic with some numbness.   Objective: Filed Vitals:   02/23/16 1608 02/23/16 2024 02/24/16 0500 02/24/16 0848  BP: 127/84 115/75 107/65 98/56  Pulse: 62 66 60 71  Temp: 97.6 F (36.4 C) 98.2 F (36.8 C) 97.9 F (36.6 C) 98.1 F (36.7 C)  TempSrc: Oral Oral Oral Oral  Resp: 20 22 18 18   Height: 6' (1.829 m)     Weight: 88.043 kg (194 lb 1.6 oz)  84.868 kg (187 lb 1.6 oz)   SpO2: 97% 100% 100% 95%    Intake/Output Summary (Last 24 hours) at 02/24/16 1022 Last data filed at 02/24/16 0917  Gross per 24 hour  Intake    243 ml  Output   2825 ml  Net  -2582 ml   Filed Weights   02/23/16 0828 02/23/16 1608 02/24/16 0500  Weight: 88.451 kg (195 lb) 88.043 kg (194 lb 1.6 oz) 84.868 kg (187 lb 1.6 oz)    Examination:  General exam: Appears calm and comfortable  Respiratory system: Crackles at bases Respiratory effort normal. Cardiovascular system: RRR. No JVD, murmurs, rubs, gallops or clicks. RLE mildly more edematous then left.  Gastrointestinal system: Abdomen is nondistended, soft and nontender.  Central nervous system: Alert and oriented. No focal neurological deficits. Extremities: Symmetric 5 x  5 power. Skin: No rashes, lesions or ulcers Psychiatry: Judgement and insight appear normal. Mood & affect appropriate.     Data Reviewed: I have personally reviewed following labs and imaging studies  CBC:  Recent Labs Lab 02/23/16 0836 02/24/16 0403  WBC 3.4* 3.2*  NEUTROABS 1.8  --   HGB 12.3* 12.3*  HCT 38.9* 38.6*  MCV 97.0 95.1  PLT 195 175   Basic Metabolic Panel:  Recent Labs Lab 02/23/16 0836 02/24/16 0403  NA 138 141  K 3.3* 3.4*  CL 108 106  CO2 25 26  GLUCOSE 110* 88  BUN 20 19  CREATININE 1.22 1.25*  CALCIUM 9.3 9.1   GFR: Estimated Creatinine Clearance: 62.1 mL/min (by C-G formula based  on Cr of 1.25). Liver Function Tests: No results for input(s): AST, ALT, ALKPHOS, BILITOT, PROT, ALBUMIN in the last 168 hours. No results for input(s): LIPASE, AMYLASE in the last 168 hours. No results for input(s): AMMONIA in the last 168 hours. Coagulation Profile:  Recent Labs Lab 02/23/16 0946 02/24/16 0403  INR 1.96* 1.54*   Cardiac Enzymes:  Recent Labs Lab 02/23/16 0836 02/23/16 1637 02/23/16 2050 02/24/16 0403  TROPONINI 0.05* 0.05* 0.04* 0.04*   Thyroid Function Tests:  Recent Labs  02/23/16 0836  TSH 0.138*   Anemia Panel: No results for input(s): VITAMINB12, FOLATE, FERRITIN, TIBC, IRON, RETICCTPCT in the last 72 hours. Urine analysis: No results found for: COLORURINE, APPEARANCEUR, LABSPEC, PHURINE, GLUCOSEU, HGBUR, BILIRUBINUR, KETONESUR, PROTEINUR, UROBILINOGEN, NITRITE, LEUKOCYTESUR Sepsis Labs: (procalcitonin:4,lacticidven:4)  )No results found for this or any previous visit (from the past 240 hour(s)).       Radiology Studies: Dg Chest 2 View  02/23/2016  CLINICAL DATA:  Shortness of breath for 4 days, worsened over the past 2 days. History of congestive heart failure. EXAM: CHEST  2 VIEW COMPARISON:  Single-view of the chest 02/03/2016. CT chest 02/12/2016. FINDINGS: There is cardiomegaly without pulmonary edema. Trace right pleural effusion is seen and there is mild subsegmental atelectasis in the right lung base. Aeration is markedly improved since the prior plain film. AICD is noted. No pneumothorax. IMPRESSION: Cardiomegaly and pulmonary vascular congestion. Aeration appears markedly improved since the prior plain film. Trace right pleural effusion and subsegmental atelectasis right lung base. Electronically Signed   By: Drusilla Kanner M.D.   On: 02/23/2016 09:04        Scheduled Meds: . aspirin EC  81 mg Oral Daily  . atorvastatin  40 mg Oral Daily  . carvedilol  12.5 mg Oral BID WC  . digoxin  0.125 mg Oral Daily  .  furosemide  40 mg Intravenous BID  . hydrALAZINE  25 mg Oral TID  . levETIRAcetam  500 mg Oral Daily  . lisinopril  2.5 mg Oral BID  . LORazepam  1 mg Oral QHS  . nitroGLYCERIN  1 inch Topical Q8H  . pantoprazole  40 mg Oral Daily  . potassium chloride SA  40 mEq Oral BID  . sodium chloride flush  3 mL Intravenous Q12H  . warfarin  5 mg Oral Once  . Warfarin - Pharmacist Dosing Inpatient   Does not apply q1800   Continuous Infusions:    LOS: 1 day    Time spent:    Erick Blinks, MD  [facility] If 7PM-7AM, please contact night-coverage [email or web address] Password [email or web address] 02/24/2016, 10:22 AM     By signing my name below, I, Zadie Cleverly, attest that this documentation has been prepared under the direction  and in the presence of Erick Blinks, MD. Electronically signed: Zadie Cleverly, Scribe. 02/24/2016   I, Dr. Erick Blinks, personally performed the services described in this documentaiton. All medical record entries made by the scribe were at my direction and in my presence. I have reviewed the chart and agree that the record reflects my personal performance and is accurate and complete  Erick Blinks, MD, 02/24/2016 1:08 PM

## 2016-02-24 NOTE — Progress Notes (Signed)
Informed by central telemetry that patient had 5 beat run VTach. MD E-Paged. Pt denies chest pain or discomfort. Pt asymptomatic. Will continue to monitor patient throughout the shift.  Vital signs as follows   02/24/16 0848  Vitals  Temp 98.1 F (36.7 C)  Temp Source Oral  BP (!) 98/56 mmHg  BP Location Left Arm  BP Method Automatic  Patient Position (if appropriate) Lying  Pulse Rate 71  Pulse Rate Source Dinamap  Resp 18

## 2016-02-24 NOTE — Progress Notes (Signed)
Pt being transferred to [facility] Room 16C. Report called and given to Ann Maki. All questions were answered and no further questions at this time. Pt in stable condition and in no acute distress at this time. Pt denies chest pain or discomfort. Pt will be escorted by [facility] and report was given to [facility].

## 2016-02-25 ENCOUNTER — Encounter (HOSPITAL_COMMUNITY)
Admission: EM | Disposition: A | Discharge status: Home / Self Care | Payer: Self-pay | Source: Home / Self Care | Attending: Interventional Cardiology

## 2016-02-25 ENCOUNTER — Other Ambulatory Visit: Payer: Medicare Other

## 2016-02-25 DIAGNOSIS — I429 Cardiomyopathy, unspecified: Secondary | ICD-10-CM

## 2016-02-25 DIAGNOSIS — I509 Heart failure, unspecified: Secondary | ICD-10-CM

## 2016-02-25 HISTORY — PX: CARDIAC CATHETERIZATION: SHX172

## 2016-02-25 LAB — PROTIME-INR
INR: 1.68 — ABNORMAL HIGH (ref 0.00–1.49)
PROTHROMBIN TIME: 19.8 s — AB (ref 11.6–15.2)

## 2016-02-25 LAB — POCT I-STAT 3, ART BLOOD GAS (G3+)
ACID-BASE EXCESS: 4 mmol/L — AB (ref 0.0–2.0)
Acid-Base Excess: 3 mmol/L — ABNORMAL HIGH (ref 0.0–2.0)
BICARBONATE: 27.3 meq/L — AB (ref 20.0–24.0)
BICARBONATE: 28.7 meq/L — AB (ref 20.0–24.0)
O2 SAT: 53 %
O2 SAT: 55 %
PCO2 ART: 39.7 mmHg (ref 35.0–45.0)
PO2 ART: 27 mmHg — AB (ref 80.0–100.0)
PO2 ART: 28 mmHg — AB (ref 80.0–100.0)
TCO2: 28 mmol/L (ref 0–100)
TCO2: 30 mmol/L (ref 0–100)
pCO2 arterial: 41.7 mmHg (ref 35.0–45.0)
pH, Arterial: 7.445 (ref 7.350–7.450)
pH, Arterial: 7.445 (ref 7.350–7.450)

## 2016-02-25 LAB — BASIC METABOLIC PANEL
Anion gap: 8 (ref 5–15)
BUN: 21 mg/dL — ABNORMAL HIGH (ref 6–20)
CHLORIDE: 101 mmol/L (ref 101–111)
CO2: 25 mmol/L (ref 22–32)
Calcium: 9.2 mg/dL (ref 8.9–10.3)
Creatinine, Ser: 1.33 mg/dL — ABNORMAL HIGH (ref 0.61–1.24)
GFR calc Af Amer: >60 mL/min (ref >60)
GFR calc non Af Amer: 53 mL/min — ABNORMAL LOW (ref >60)
Glucose, Bld: 101 mg/dL — ABNORMAL HIGH (ref 65–99)
Potassium: 3.9 mmol/L (ref 3.5–5.1)
SODIUM: 134 mmol/L — AB (ref 135–145)

## 2016-02-25 SURGERY — RIGHT/LEFT HEART CATH AND CORONARY ANGIOGRAPHY

## 2016-02-25 MED ORDER — CARVEDILOL 6.25 MG PO TABS
6.2500 mg | ORAL_TABLET | Freq: Two times a day (BID) | ORAL | Status: DC
  Administered 2016-02-25 – 2016-03-01 (×10): 6.25 mg via ORAL
  Filled 2016-02-25 (×11): qty 1

## 2016-02-25 MED ORDER — MAGNESIUM HYDROXIDE 400 MG/5ML PO SUSP
15.0000 mL | Freq: Every day | ORAL | Status: DC | PRN
  Administered 2016-02-25 – 2016-02-29 (×4): 15 mL via ORAL
  Filled 2016-02-25 (×4): qty 30

## 2016-02-25 MED ORDER — SODIUM CHLORIDE 0.9% FLUSH: 3.0000 mL | INTRAVENOUS | Status: DC | PRN

## 2016-02-25 MED ORDER — IOPAMIDOL (ISOVUE-370) INJECTION 76%
INTRAVENOUS | Status: AC
  Filled 2016-02-25: qty 100

## 2016-02-25 MED ORDER — LIDOCAINE HCL (PF) 1 % IJ SOLN
INTRAMUSCULAR | Status: AC
  Filled 2016-02-25: qty 30

## 2016-02-25 MED ORDER — ISOSORBIDE MONONITRATE ER 30 MG PO TB24
30.0000 mg | ORAL_TABLET | Freq: Every day | ORAL | Status: DC
  Administered 2016-02-25 – 2016-03-01 (×6): 30 mg via ORAL
  Filled 2016-02-25 (×6): qty 1

## 2016-02-25 MED ORDER — SODIUM CHLORIDE 0.9% FLUSH: 3.0000 mL | Freq: Two times a day (BID) | INTRAVENOUS | Status: DC

## 2016-02-25 MED ORDER — FENTANYL CITRATE (PF) 100 MCG/2ML IJ SOLN
INTRAMUSCULAR | Status: DC | PRN
  Administered 2016-02-25: 25 ug via INTRAVENOUS

## 2016-02-25 MED ORDER — SODIUM CHLORIDE 0.9 % IV SOLN: 250.0000 mL | INTRAVENOUS | Status: DC | PRN

## 2016-02-25 MED ORDER — VERAPAMIL HCL 2.5 MG/ML IV SOLN
INTRAVENOUS | Status: AC
  Filled 2016-02-25: qty 2

## 2016-02-25 MED ORDER — SODIUM CHLORIDE 0.9 % IV SOLN
INTRAVENOUS | Status: DC
  Administered 2016-02-25: 09:00:00 via INTRAVENOUS

## 2016-02-25 MED ORDER — VERAPAMIL HCL 2.5 MG/ML IV SOLN
INTRAVENOUS | Status: DC | PRN
  Administered 2016-02-25: 10 mL via INTRA_ARTERIAL

## 2016-02-25 MED ORDER — HEPARIN (PORCINE) IN NACL 2-0.9 UNIT/ML-% IJ SOLN
INTRAMUSCULAR | Status: DC | PRN
  Administered 2016-02-25: 1000 mL

## 2016-02-25 MED ORDER — SPIRONOLACTONE 25 MG PO TABS
12.5000 mg | ORAL_TABLET | Freq: Every day | ORAL | Status: DC
  Administered 2016-02-25 – 2016-03-01 (×6): 12.5 mg via ORAL
  Filled 2016-02-25 (×6): qty 1

## 2016-02-25 MED ORDER — ISOSORBIDE MONONITRATE ER 30 MG PO TB24: 15.0000 mg | ORAL_TABLET | Freq: Every day | ORAL | Status: DC

## 2016-02-25 MED ORDER — LIDOCAINE HCL (PF) 1 % IJ SOLN
INTRAMUSCULAR | Status: DC | PRN
  Administered 2016-02-25 (×2): 2 mL

## 2016-02-25 MED ORDER — ASPIRIN 81 MG PO CHEW
81.0000 mg | CHEWABLE_TABLET | ORAL | Status: AC
  Administered 2016-02-25: 81 mg via ORAL
  Filled 2016-02-25: qty 1

## 2016-02-25 MED ORDER — SODIUM CHLORIDE 0.9% FLUSH
3.0000 mL | Freq: Two times a day (BID) | INTRAVENOUS | Status: DC
  Administered 2016-02-25 – 2016-02-26 (×2): 3 mL via INTRAVENOUS

## 2016-02-25 MED ORDER — MIDAZOLAM HCL 2 MG/2ML IJ SOLN
INTRAMUSCULAR | Status: AC
  Filled 2016-02-25: qty 2

## 2016-02-25 MED ORDER — AMIODARONE HCL IN DEXTROSE 360-4.14 MG/200ML-% IV SOLN
60.0000 mg/h | INTRAVENOUS | Status: AC
  Administered 2016-02-25 (×2): 60 mg/h via INTRAVENOUS
  Filled 2016-02-25 (×2): qty 200

## 2016-02-25 MED ORDER — MILRINONE LACTATE IN DEXTROSE 20-5 MG/100ML-% IV SOLN
0.1250 ug/kg/min | INTRAVENOUS | Status: DC
  Administered 2016-02-25 – 2016-02-26 (×3): 0.25 ug/kg/min via INTRAVENOUS
  Administered 2016-02-27: 0.125 ug/kg/min via INTRAVENOUS
  Filled 2016-02-25 (×5): qty 100

## 2016-02-25 MED ORDER — ACETAMINOPHEN 325 MG PO TABS
650.0000 mg | ORAL_TABLET | ORAL | Status: DC | PRN
  Administered 2016-02-26 – 2016-02-29 (×5): 650 mg via ORAL
  Filled 2016-02-25 (×5): qty 2

## 2016-02-25 MED ORDER — MIDAZOLAM HCL 2 MG/2ML IJ SOLN
INTRAMUSCULAR | Status: DC | PRN
  Administered 2016-02-25: 1 mg via INTRAVENOUS

## 2016-02-25 MED ORDER — HEPARIN SODIUM (PORCINE) 1000 UNIT/ML IJ SOLN
INTRAMUSCULAR | Status: AC
  Filled 2016-02-25: qty 1

## 2016-02-25 MED ORDER — HEPARIN (PORCINE) IN NACL 100-0.45 UNIT/ML-% IJ SOLN
1400.0000 [IU]/h | INTRAMUSCULAR | Status: DC
  Administered 2016-02-25: 1300 [IU]/h via INTRAVENOUS
  Administered 2016-02-26: 1400 [IU]/h via INTRAVENOUS
  Filled 2016-02-25 (×2): qty 250

## 2016-02-25 MED ORDER — SODIUM CHLORIDE 0.9% FLUSH
3.0000 mL | Freq: Two times a day (BID) | INTRAVENOUS | Status: DC
  Administered 2016-02-25 – 2016-02-29 (×7): 3 mL via INTRAVENOUS

## 2016-02-25 MED ORDER — FENTANYL CITRATE (PF) 100 MCG/2ML IJ SOLN
INTRAMUSCULAR | Status: AC
  Filled 2016-02-25: qty 2

## 2016-02-25 MED ORDER — ONDANSETRON HCL 4 MG/2ML IJ SOLN
4.0000 mg | Freq: Four times a day (QID) | INTRAMUSCULAR | Status: DC | PRN
  Administered 2016-02-27: 4 mg via INTRAVENOUS
  Filled 2016-02-25: qty 2

## 2016-02-25 MED ORDER — HEPARIN SODIUM (PORCINE) 1000 UNIT/ML IJ SOLN
INTRAMUSCULAR | Status: DC | PRN
  Administered 2016-02-25: 4500 [IU] via INTRAVENOUS

## 2016-02-25 MED ORDER — AMIODARONE HCL IN DEXTROSE 360-4.14 MG/200ML-% IV SOLN
30.0000 mg/h | INTRAVENOUS | Status: DC
  Administered 2016-02-25 – 2016-02-26 (×2): 30 mg/h via INTRAVENOUS
  Filled 2016-02-25 (×4): qty 200

## 2016-02-25 MED ORDER — IOPAMIDOL (ISOVUE-370) INJECTION 76%
INTRAVENOUS | Status: DC | PRN
  Administered 2016-02-25: 40 mL via INTRA_ARTERIAL

## 2016-02-25 MED ORDER — AMIODARONE LOAD VIA INFUSION
150.0000 mg | Freq: Once | INTRAVENOUS | Status: AC
  Administered 2016-02-25: 150 mg via INTRAVENOUS
  Filled 2016-02-25: qty 83.34

## 2016-02-25 MED ORDER — HEPARIN (PORCINE) IN NACL 2-0.9 UNIT/ML-% IJ SOLN
INTRAMUSCULAR | Status: AC
Start: 2016-02-25 — End: 2016-02-25
  Filled 2016-02-25: qty 1000

## 2016-02-25 MED ORDER — WARFARIN SODIUM 7.5 MG PO TABS
7.5000 mg | ORAL_TABLET | Freq: Once | ORAL | Status: AC
  Administered 2016-02-25: 7.5 mg via ORAL
  Filled 2016-02-25: qty 1

## 2016-02-25 SURGICAL SUPPLY — 11 items
CATH BALLN WEDGE 5F 110CM (CATHETERS) ×3 IMPLANT
CATH INFINITI 5 FR JL3.5 (CATHETERS) ×3 IMPLANT
CATH INFINITI JR4 5F (CATHETERS) ×3 IMPLANT
DEVICE RAD COMP TR BAND LRG (VASCULAR PRODUCTS) ×3 IMPLANT
GLIDESHEATH SLEND SS 6F .021 (SHEATH) ×3 IMPLANT
KIT HEART LEFT (KITS) ×3 IMPLANT
PACK CARDIAC CATHETERIZATION (CUSTOM PROCEDURE TRAY) ×3 IMPLANT
SHEATH FAST CATH BRACH 5F 5CM (SHEATH) ×3 IMPLANT
TRANSDUCER W/STOPCOCK (MISCELLANEOUS) ×3 IMPLANT
TUBING CIL FLEX 10 FLL-RA (TUBING) ×3 IMPLANT
WIRE SAFE-T 1.5MM-J .035X260CM (WIRE) ×3 IMPLANT

## 2016-02-25 NOTE — H&P (View-Only) (Signed)
Advanced Heart Failure Rounding Note   Subjective:   70 y.o. AA male just moved here from [place] to be closer to his family, with multiple medical problems and new to our system.  He had an appointment with Dr Diona Browner this Thursday as initial consultation. 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 Most of the history of from records from [facility] hospitalizations (no records from his cardiologist).   Admitted to [facility] with SOB, His CXR showed vascular congestion and BNP 2021. He feels better this am after a 3L diuresis. He did have a run of 30 beats of NSVT- he says he defibrillator fired- ICD check shows two recent shocks. Records show he was on Amiodarone in Sept 2016-he is not sure why he is not on this now.  Yesterday transferred to [facility] for EP evaluation and HF management.   Today feeling better. Denies SOB. + cough.      Objective:   Weight Range:  Vital Signs:   Temp:  [97.8 F (36.6 C)-98.5 F (36.9 C)] 98.2 F (36.8 C) (07/13 0435) Pulse Rate:  [42-75] 42 (07/13 0600) Resp:  [15-24] 21 (07/13 0600) BP: (84-98)/(54-73) 89/61 mmHg (07/13 0600) SpO2:  [89 %-100 %] 100 % (07/13 0600) Weight:  [188 lb 9.6 oz (85.548 kg)] 188 lb 9.6 oz (85.548 kg) (07/13 0435) Last BM Date: 02/24/16  Weight change: Filed Weights   02/23/16 1608 02/24/16 0500 02/25/16 0435  Weight: 194 lb 1.6 oz (88.043 kg) 187 lb 1.6 oz (84.868 kg) 188 lb 9.6 oz (85.548 kg)    Intake/Output:   Intake/Output Summary (Last 24 hours) at 02/25/16 0723 Last data filed at 02/24/16 2000  Gross per 24 hour  Intake      3 ml  Output    800 ml  Net   -797 ml     Physical Exam: General:  Well appearing. No  resp difficulty. In bed HEENT: normal Neck: supple. JVP 12 cm. Carotids 2+ bilat; no bruits. No lymphadenopathy or thryomegaly appreciated. Cor: PMI nondisplaced. Regular rate & rhythm. No rubs, gallops or murmurs. Lungs: clear Abdomen: soft, nontender, nondistended. No hepatosplenomegaly. No bruits or masses. Good bowel sounds. Extremities: warm, no cyanosis, clubbing, rash, edema Neuro: alert & orientedx3, cranial nerves grossly intact. moves all 4 extremities w/o difficulty. Affect pleasant  Telemetry: A sensed V paced PVCs 70s  Labs: Basic Metabolic Panel:  Recent Labs Lab 02/23/16 0836 02/24/16 0403 02/24/16 2339  NA 138 141 134*  K 3.3* 3.4* 3.9  CL 108 106 101  CO2 GLUCOSE 110* 88 101*  BUN 20 19 21*  CREATININE 1.22 1.25* 1.33*  CALCIUM 9.3 9.1 9.2    Liver Function Tests: No results for input(s): AST, ALT, ALKPHOS, BILITOT, PROT, ALBUMIN in the last 168 hours. No results for input(s): LIPASE, AMYLASE in the last 168 hours. No results for input(s): AMMONIA in the last 168 hours.  CBC:  Recent Labs Lab 02/23/16 0836 02/24/16 0403  WBC 3.4* 3.2*  NEUTROABS 1.8  --   HGB 12.3* 12.3*  HCT 38.9* 38.6*  MCV 97.0 95.1  PLT 195 175    Cardiac Enzymes:  Recent Labs Lab 02/23/16 0836 02/23/16 1637  02/23/16 2050 02/24/16 0403  TROPONINI 0.05* 0.05* 0.04* 0.04*    BNP: BNP (last 3 results)  Recent Labs  02/03/16 0521 02/23/16 0836  BNP 1595.0* 2021.0*    ProBNP (last 3 results) No results for input(s): PROBNP in the last 8760 hours.    Other results:  Imaging: Dg Chest 2 View  02/23/2016  CLINICAL DATA:  Shortness of breath for 4 days, worsened over the past 2 days. History of congestive heart failure. EXAM: CHEST  2 VIEW COMPARISON:  Single-view of the chest 02/03/2016. CT chest 02/12/2016. FINDINGS: There is cardiomegaly without pulmonary edema. Trace right pleural effusion is seen and there is mild subsegmental atelectasis in the  right lung base. Aeration is markedly improved since the prior plain film. AICD is noted. No pneumothorax. IMPRESSION: Cardiomegaly and pulmonary vascular congestion. Aeration appears markedly improved since the prior plain film. Trace right pleural effusion and subsegmental atelectasis right lung base. Electronically Signed   By: Drusilla Kanner M.D.   On: 02/23/2016 09:04      Medications:     Scheduled Medications: . aspirin EC  81 mg Oral Daily  . atorvastatin  40 mg Oral Daily  . carvedilol  12.5 mg Oral BID WC  . digoxin  0.125 mg Oral Daily  . furosemide  40 mg Intravenous BID  . hydrALAZINE  25 mg Oral TID  . levETIRAcetam  500 mg Oral Daily  . lisinopril  2.5 mg Oral BID  . LORazepam  1 mg Oral QHS  . nitroGLYCERIN  1 inch Topical Q8H  . pantoprazole  40 mg Oral Daily  . potassium chloride SA  40 mEq Oral BID  . sodium chloride flush  3 mL Intravenous Q12H  . Warfarin - Pharmacist Dosing Inpatient   Does not apply q1800     Infusions:     PRN Medications:  acetaminophen, albuterol, LORazepam   Assessment/Plan    1. VT- Has ST Jude. 2 recent shocks on July 11 and July 12th. Multiple episodes of NSVT. ICD shocks. Has been on amiodarone in the past last September. He is not sure why he isnt on it now. Ask St Jude for interrogation. Start amio drip.  EP consult pending. TSH 0.138 2. A/C Systolic Heart Failure . He is not sure if had ischemic work up. Records requested from his cardiologist in [place]. Mom has history of heart failure. If he hasnt had ischemic work up will need LHC. ECHO EF 15%-20%. Diuresing with IV lasix. Mild volume overload. Continue IV lasix 40 mg twice a day. SBP soft likely due to diuresis. Add 12.5 mg spiro daily. Cut carvedilol back to 6.25 mg twice a day. Continue dig 0.125 mg. Dig level .7. Continue hydralazine 25 mg tid and add imdur 15 mg daily. SBP soft and has cough. Stop lisinopril (last dose of lisinopril was 7/12).. Can add losartan  versus entresto later.  3. H/O PE 4. H/O CVA 5.PAF- As above putting back on amio. On coumadin. INR 1.68.  6. On chronic Coumadin- INR 1.68. Pharmacy to dose coumadin.   7. Seizure- On keppra 8. Former Smoker- Quit 3 years ago.  9. DNR   Discussed with Dr Shirlee Latch. RHC/LHC today.  Length of Stay: 2   Amy Clegg NP-C  02/25/2016, 7:23 AM  Advanced Heart Failure Team Pager [phone] (M-F; 7a - 4p)  Please contact [facility] for night-coverage after hours (4p -7a ) and weekends on [email or web address]  Patient seen with NP, agree with the above note.  He was admitted with VT/ICD discharge and acute/chronic systolic CHF.  We do not have full records on him as he recently moved to [place].  This morning, he remains volume overloaded on exam.  1. VT: Multiple episodes of VT, shocked on 7/11.  May have passed out but he does not remember.  Used to be on amiodarone but has been off for a while now and not sure why. Has [employer] CRT-D device.  - Will involve EP to make sure device therapies optimized.  - We will start him back on amiodarone.  - Will need coronary angiography to rule out CAD, plan for this afternoon.  - Should not drive x 6 months.  2. Acute on chronic systolic CHF: ?Nonischemic cardiomyopathy though he does not ever remember cardiac catheterization.  Followed in [place] in the past, we are trying to get records.  Mother has CHF.  Echo this admission with moderate LV dilation, EF 15-20% with some regionality in wall motion abnormalities.  On exam, he remains volume overloaded.  Has had NYHA class IIIb symptoms at home.  - Needs ischemic workup.  Plan LHC/RHC today.  Discussed risks/benefits of procedure with patient and he agrees to proceed.  - Still volume overloaded, seems to be diuresing well with current IV Lasix, will continue.  - Soft BP, decrease Coreg to 6.25 mg bid.  - Continue digoxin 0.125, level ok.  - Continue hydralazine 25 mg tid and add Imdur 30 daily.  - Can add  spironolactone 12.5 daily.  - Has cough that may be lisinopril-related.  Stop lisinopril, will likely start back tomorrow on losartan at low dose (but watch BP).  Think BP too soft at this point for Entresto.  3. H/o PAF: He is in NSR, on warfarin.  He will restart amiodarone.  4. H/o PE: Warfarin.  5. 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  INR 1.68, will start heparin gtt after cath with subtherapeutic INR and restart warfarin.   [name] 02/25/2016 8:59 AM

## 2016-02-25 NOTE — Care Management Important Message (Signed)
Important Message  Patient Details  Name: Johnathan Morrison MRN: [id number] Date of Birth: March 27, 1946   Medicare Important Message Given:  Yes    Bernadette Hoit 02/25/2016, 8:15 AM

## 2016-02-25 NOTE — Progress Notes (Signed)
ANTICOAGULATION CONSULT NOTE - follow up  Pharmacy Consult for Coumadin / Heparin Indication: atrial fibrillation  No Known Allergies  Patient Measurements: Height: 6' (182.9 cm) Weight: 188 lb 9.6 oz (85.548 kg) IBW/kg (Calculated) : 77.6  Vital Signs: Temp: 97.7 F (36.5 C) (07/13 1142) Temp Source: Oral (07/13 1142) BP: 94/58 mmHg (07/13 1508) Pulse Rate: 63 (07/13 1508)   Assessment: 70 y.o. male with a hx of PE, afib, CVA, SAH, HTN, CHF, and AICD/PPM insertion who presents to the Emergency Department complaining of recurrent, moderate SOB, worse over the past two days.   Home Coumadin dose is 5mg  daily.  S/p cath 7/13 with sheath removal at 1500 pm, to resume heparin at 2300 pm, to resume Coumadin tonight as well  Goal of Therapy:  INR 2-3 Monitor platelets by anticoagulation protocol: Yes  Heparin level = 0.3-0.7   Plan:  Coumadin 7.5mg  x 1 today Heparin at 1300 units / hr starting at 2300 pm Daily PT/INR/CBC/Heparin level Monitor for s/s of bleeding  Thank you Okey Regal, PharmD [phone] 02/25/2016 3:50 PM

## 2016-02-25 NOTE — Progress Notes (Signed)
Advanced Heart Failure Rounding Note   Subjective:   70 y.o. AA male just moved here from [place] to be closer to his family, with multiple medical problems and new to our system.  He had an appointment with Dr Diona Browner this Thursday as initial consultation. 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 Most of the history of from records from [facility] hospitalizations (no records from his cardiologist).   Admitted to [facility] with SOB, His CXR showed vascular congestion and BNP 2021. He feels better this am after a 3L diuresis. He did have a run of 30 beats of NSVT- he says he defibrillator fired- ICD check shows two recent shocks. Records show he was on Amiodarone in Sept 2016-he is not sure why he is not on this now.  Yesterday transferred to [facility] for EP evaluation and HF management.   Today feeling better. Denies SOB. + cough.      Objective:   Weight Range:  Vital Signs:   Temp:  [97.8 F (36.6 C)-98.5 F (36.9 C)] 98.2 F (36.8 C) (07/13 0435) Pulse Rate:  [42-75] 42 (07/13 0600) Resp:  [15-24] 21 (07/13 0600) BP: (84-98)/(54-73) 89/61 mmHg (07/13 0600) SpO2:  [89 %-100 %] 100 % (07/13 0600) Weight:  [188 lb 9.6 oz (85.548 kg)] 188 lb 9.6 oz (85.548 kg) (07/13 0435) Last BM Date: 02/24/16  Weight change: Filed Weights   02/23/16 1608 02/24/16 0500 02/25/16 0435  Weight: 194 lb 1.6 oz (88.043 kg) 187 lb 1.6 oz (84.868 kg) 188 lb 9.6 oz (85.548 kg)    Intake/Output:   Intake/Output Summary (Last 24 hours) at 02/25/16 0723 Last data filed at 02/24/16 2000  Gross per 24 hour  Intake      3 ml  Output    800 ml  Net   -797 ml     Physical Exam: General:  Well appearing. No  resp difficulty. In bed HEENT: normal Neck: supple. JVP 12 cm. Carotids 2+ bilat; no bruits. No lymphadenopathy or thryomegaly appreciated. Cor: PMI nondisplaced. Regular rate & rhythm. No rubs, gallops or murmurs. Lungs: clear Abdomen: soft, nontender, nondistended. No hepatosplenomegaly. No bruits or masses. Good bowel sounds. Extremities: warm, no cyanosis, clubbing, rash, edema Neuro: alert & orientedx3, cranial nerves grossly intact. moves all 4 extremities w/o difficulty. Affect pleasant  Telemetry: A sensed V paced PVCs 70s  Labs: Basic Metabolic Panel:  Recent Labs Lab 02/23/16 0836 02/24/16 0403 02/24/16 2339  NA 138 141 134*  K 3.3* 3.4* 3.9  CL 108 106 101  CO2 GLUCOSE 110* 88 101*  BUN 20 19 21*  CREATININE 1.22 1.25* 1.33*  CALCIUM 9.3 9.1 9.2    Liver Function Tests: No results for input(s): AST, ALT, ALKPHOS, BILITOT, PROT, ALBUMIN in the last 168 hours. No results for input(s): LIPASE, AMYLASE in the last 168 hours. No results for input(s): AMMONIA in the last 168 hours.  CBC:  Recent Labs Lab 02/23/16 0836 02/24/16 0403  WBC 3.4* 3.2*  NEUTROABS 1.8  --   HGB 12.3* 12.3*  HCT 38.9* 38.6*  MCV 97.0 95.1  PLT 195 175    Cardiac Enzymes:  Recent Labs Lab 02/23/16 0836 02/23/16 1637  02/23/16 2050 02/24/16 0403  TROPONINI 0.05* 0.05* 0.04* 0.04*    BNP: BNP (last 3 results)  Recent Labs  02/03/16 0521 02/23/16 0836  BNP 1595.0* 2021.0*    ProBNP (last 3 results) No results for input(s): PROBNP in the last 8760 hours.    Other results:  Imaging: Dg Chest 2 View  02/23/2016  CLINICAL DATA:  Shortness of breath for 4 days, worsened over the past 2 days. History of congestive heart failure. EXAM: CHEST  2 VIEW COMPARISON:  Single-view of the chest 02/03/2016. CT chest 02/12/2016. FINDINGS: There is cardiomegaly without pulmonary edema. Trace right pleural effusion is seen and there is mild subsegmental atelectasis in the  right lung base. Aeration is markedly improved since the prior plain film. AICD is noted. No pneumothorax. IMPRESSION: Cardiomegaly and pulmonary vascular congestion. Aeration appears markedly improved since the prior plain film. Trace right pleural effusion and subsegmental atelectasis right lung base. Electronically Signed   By: Drusilla Kanner M.D.   On: 02/23/2016 09:04      Medications:     Scheduled Medications: . aspirin EC  81 mg Oral Daily  . atorvastatin  40 mg Oral Daily  . carvedilol  12.5 mg Oral BID WC  . digoxin  0.125 mg Oral Daily  . furosemide  40 mg Intravenous BID  . hydrALAZINE  25 mg Oral TID  . levETIRAcetam  500 mg Oral Daily  . lisinopril  2.5 mg Oral BID  . LORazepam  1 mg Oral QHS  . nitroGLYCERIN  1 inch Topical Q8H  . pantoprazole  40 mg Oral Daily  . potassium chloride SA  40 mEq Oral BID  . sodium chloride flush  3 mL Intravenous Q12H  . Warfarin - Pharmacist Dosing Inpatient   Does not apply q1800     Infusions:     PRN Medications:  acetaminophen, albuterol, LORazepam   Assessment/Plan    1. VT- Has ST Jude. 2 recent shocks on July 11 and July 12th. Multiple episodes of NSVT. ICD shocks. Has been on amiodarone in the past last September. He is not sure why he isnt on it now. Ask St Jude for interrogation. Start amio drip.  EP consult pending. TSH 0.138 2. A/C Systolic Heart Failure . He is not sure if had ischemic work up. Records requested from his cardiologist in [place]. Mom has history of heart failure. If he hasnt had ischemic work up will need LHC. ECHO EF 15%-20%. Diuresing with IV lasix. Mild volume overload. Continue IV lasix 40 mg twice a day. SBP soft likely due to diuresis. Add 12.5 mg spiro daily. Cut carvedilol back to 6.25 mg twice a day. Continue dig 0.125 mg. Dig level .7. Continue hydralazine 25 mg tid and add imdur 15 mg daily. SBP soft and has cough. Stop lisinopril (last dose of lisinopril was 7/12).. Can add losartan  versus entresto later.  3. H/O PE 4. H/O CVA 5.PAF- As above putting back on amio. On coumadin. INR 1.68.  6. On chronic Coumadin- INR 1.68. Pharmacy to dose coumadin.   7. Seizure- On keppra 8. Former Smoker- Quit 3 years ago.  9. DNR   Discussed with Dr Shirlee Latch. RHC/LHC today.  Length of Stay: 2   Amy Clegg NP-C  02/25/2016, 7:23 AM  Advanced Heart Failure Team Pager [phone] (M-F; 7a - 4p)  Please contact [facility] for night-coverage after hours (4p -7a ) and weekends on [email or web address]  Patient seen with NP, agree with the above note.  He was admitted with VT/ICD discharge and acute/chronic systolic CHF.  We do not have full records on him as he recently moved to [place].  This morning, he remains volume overloaded on exam.  1. VT: Multiple episodes of VT, shocked on 7/11.  May have passed out but he does not remember.  Used to be on amiodarone but has been off for a while now and not sure why. Has [employer] CRT-D device.  - Will involve EP to make sure device therapies optimized.  - We will start him back on amiodarone.  - Will need coronary angiography to rule out CAD, plan for this afternoon.  - Should not drive x 6 months.  2. Acute on chronic systolic CHF: ?Nonischemic cardiomyopathy though he does not ever remember cardiac catheterization.  Followed in [place] in the past, we are trying to get records.  Mother has CHF.  Echo this admission with moderate LV dilation, EF 15-20% with some regionality in wall motion abnormalities.  On exam, he remains volume overloaded.  Has had NYHA class IIIb symptoms at home.  - Needs ischemic workup.  Plan LHC/RHC today.  Discussed risks/benefits of procedure with patient and he agrees to proceed.  - Still volume overloaded, seems to be diuresing well with current IV Lasix, will continue.  - Soft BP, decrease Coreg to 6.25 mg bid.  - Continue digoxin 0.125, level ok.  - Continue hydralazine 25 mg tid and add Imdur 30 daily.  - Can add  spironolactone 12.5 daily.  - Has cough that may be lisinopril-related.  Stop lisinopril, will likely start back tomorrow on losartan at low dose (but watch BP).  Think BP too soft at this point for Entresto.  3. H/o PAF: He is in NSR, on warfarin.  He will restart amiodarone.  4. H/o PE: Warfarin.  5. 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  INR 1.68, will start heparin gtt after cath with subtherapeutic INR and restart warfarin.   [name] 02/25/2016 8:59 AM

## 2016-02-25 NOTE — Progress Notes (Signed)
Removed brachial sheath and applied manual pressure for 10 minutes. Pt vss and hemostasis achieved. Pressure dressing applied. Will continue to monitor.   Edgardo Roys

## 2016-02-25 NOTE — Consult Note (Addendum)
ELECTROPHYSIOLOGY CONSULT NOTE    Patient ID: Johnathan Morrison MRN: [id number], DOB/AGE: 1946-05-13 70 y.o.  Admit date: 02/23/2016 Date of Consult: 02/25/2016   Primary Physician: [facility] Primary Cardiologist: Dr. Diona Browner (new) Requesting MD: Dr. Shirlee Latch  Reason for Consultation: VT  HPI: Johnathan Morrison is a 70 y.o. 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 (his PMHx is obtained from the chart).  He was admitted to [facility] with increased SOB, was observed to have 30beats NSVT there with reported ICD shocks by the patient as well.  He was transferred to [facility] for further care and management.  Cath today reveals nonobstructive disease with decompensated CHF.  He has been initiated on milrinone and amiodarone drips.  Currently, without complaint.  Pt is DNR.  LABS: K+ 3.3 >> 3.9 BUN/Creat 20/1.22 >> 25/1.33 BNP 2021 H/H 12/38 WBC 3.4  ICD interrogation: shows 11 VF episodes since June , all treated with ATP, 2 requiring shocks one on July 10th and another July 11   He has diuresed cumulatively - Being started on milrinone with CHF team  The patient is a DNR  Past Medical History  Diagnosis Date  . Seizures (HCC)   . Essential hypertension   . Chronic systolic heart failure (HCC)   . Atrial fibrillation (HCC)   . Cardiomyopathy (HCC)   . PE (pulmonary embolism) March 2016  . COPD (chronic obstructive pulmonary disease) (HCC)   . Pulmonary HTN (HCC)     Rt heart cath June 2016  . Diastolic dysfunction     Grade 3  . 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 SAH (subarachnoid hemorrhage) (HCC) Dec  2015  . Chronic renal disease, stage III      Surgical History:  Past Surgical History  Procedure Laterality Date  . Insertion of icd  09/25/12    Bi V St Jude   . Total knee arthroplasty Bilateral   . Ankle surgery    . Ivc filter placement ([facility] hx)  March 2016  . Ivc filter removal ([facility] hx)  Oct 2016     Prescriptions prior to admission  Medication Sig Dispense Refill Last Dose  . acetaminophen (TYLENOL) 500 MG tablet Take 1,000 mg by mouth every 6 (six) hours as needed.   02/23/2016 at Unknown time  . albuterol (PROVENTIL HFA;VENTOLIN HFA) 108 (90 Base) MCG/ACT inhaler Inhale 2 puffs into the lungs every 6 (six) hours as needed for wheezing or shortness of breath. 1 Inhaler 0 02/23/2016 at Unknown time  . aspirin EC 81 MG tablet Take 81 mg by mouth daily.   02/23/2016 at Unknown time  . atorvastatin (LIPITOR) 40 MG tablet Take 1 tablet by mouth daily.  0 02/23/2016 at Unknown time  . carvedilol (COREG) 12.5 MG tablet Take 12.5 mg by mouth 2 (two) times daily with a meal.   02/23/2016 at 630  . digoxin (LANOXIN) 0.125 MG tablet Take 0.125 mg by mouth daily.    02/23/2016 at Unknown time  . esomeprazole (NEXIUM) 20 MG capsule Take 20 mg by mouth daily at 12 noon.   02/23/2016 at Unknown time  . furosemide (LASIX)  40 MG tablet Take 2 po BID x 2-3 days until the fluid is gone then take 1 po QD (Patient taking differently: Take 40 mg by mouth daily. ) 30 tablet 0 02/23/2016 at Unknown time  . hydrALAZINE (APRESOLINE) 25 MG tablet Take 25 mg by mouth 3 (three) times daily.   02/23/2016 at Unknown time  . levETIRAcetam (KEPPRA) 500 MG tablet Take 500 mg by mouth daily.    02/23/2016 at Unknown time  . lisinopril (PRINIVIL,ZESTRIL) 2.5 MG tablet Take 2.5 mg by mouth 2 (two) times daily.   02/23/2016 at 630  . LORazepam (ATIVAN) 0.5 MG tablet Take 0.5 mg by mouth every 8 (eight) hours as needed for anxiety. Reported on 02/23/2016   Past Week at Unknown time  . potassium chloride (K-DUR) 10 MEQ tablet  Take 20 mEq by mouth 2 (two) times daily.   02/23/2016 at Unknown time  . warfarin (COUMADIN) 5 MG tablet Take 5 mg by mouth daily.   02/23/2016 at 630  . nitroGLYCERIN (NITROSTAT) 0.4 MG SL tablet Place 1 tablet (0.4 mg total) under the tongue every 5 (five) minutes as needed for chest pain. 30 tablet 0 02/12/2016 at Unknown time    Inpatient Medications:  . atorvastatin  40 mg Oral Daily  . carvedilol  6.25 mg Oral BID WC  . digoxin  0.125 mg Oral Daily  . furosemide  40 mg Intravenous BID  . hydrALAZINE  25 mg Oral TID  . isosorbide mononitrate  30 mg Oral Daily  . levETIRAcetam  500 mg Oral Daily  . LORazepam  1 mg Oral QHS  . pantoprazole  40 mg Oral Daily  . potassium chloride SA  40 mEq Oral BID  . sodium chloride flush  3 mL Intravenous Q12H  . sodium chloride flush  3 mL Intravenous Q12H  . sodium chloride flush  3 mL Intravenous Q12H  . spironolactone  12.5 mg Oral Daily  . warfarin  7.5 mg Oral ONCE-1800  . Warfarin - Pharmacist Dosing Inpatient   Does not apply q1800    Allergies: No Known Allergies  Social History   Social History  . Marital Status: Single    Spouse Name: N/A  . Number of Children: N/A  . Years of Education: N/A   Occupational History  . Not on file.   Social History Main Topics  . Smoking status: Never Smoker   . Smokeless tobacco: Not on file  . Alcohol Use: No  . Drug Use: No  . Sexual Activity: Not on file   Other Topics Concern  . Not on file   Social History Narrative     Family History  Problem Relation Age of Onset  . Cardiomyopathy    . Sudden death       Review of Systems: All other systems reviewed and are otherwise negative except as noted above.  Physical Exam: Filed Vitals:   02/25/16 1503 02/25/16 1504 02/25/16 1508 02/25/16 1600  BP: 90/59  94/58 90/64  Pulse: 65  63 85  Temp:    97.5 F (36.4 C)  TempSrc:    Oral  Resp: Height:      Weight:      SpO2: 86% 88% 98% 95%    GEN- The patient is  ill appearing, alert and oriented x 3 today.   HEENT: normocephalic, atraumatic; sclera clear, conjunctiva pink; hearing intact; oropharynx clear; neck supple, no JVP Lymph- no cervical lymphadenopathy Lungs- Clear to ausculation  bilaterally, normal work of breathing.  No wheezes, rales, rhonchi Heart- Regular rate and rhythm  GI- soft, non-tender, non-distended, bowel sounds present Extremities- no clubbing, cyanosis, or edema; DP/PT/radial pulses 2+ bilaterally MS- no significant deformity or atrophy Skin- warm and dry, no rash or lesion Psych- euthymic mood, full affect Neuro- no gross deficits observed  Labs:   Lab Results  Component Value Date   WBC 3.2* 02/24/2016   HGB 12.3* 02/24/2016   HCT 38.6* 02/24/2016   MCV 95.1 02/24/2016   PLT 175 02/24/2016    Recent Labs Lab 02/24/16 2339  NA 134*  K 3.9  CL 101  CO2 25  BUN 21*  CREATININE 1.33*  CALCIUM 9.2  GLUCOSE 101*      Radiology/Studies:  Dg Chest 2 View 02/23/2016  CLINICAL DATA:  Shortness of breath for 4 days, worsened over the past 2 days. History of congestive heart failure. EXAM: CHEST  2 VIEW COMPARISON:  Single-view of the chest 02/03/2016. CT chest 02/12/2016. FINDINGS: There is cardiomegaly without pulmonary edema. Trace right pleural effusion is seen and there is mild subsegmental atelectasis in the right lung base. Aeration is markedly improved since the prior plain film. AICD is noted. No pneumothorax. IMPRESSION: Cardiomegaly and pulmonary vascular congestion. Aeration appears markedly improved since the prior plain film. Trace right pleural effusion and subsegmental atelectasis right lung base. Electronically Signed   By: Drusilla Kanner M.D.   On: 02/23/2016 09:04    EKG: SR, V paced TELEMETRY: SR, V paced  02/23/16: Echocardiogram Study Conclusions - Left ventricle: The cavity size was moderately dilated. Wall  thickness was normal. Systolic function was severely reduced. The  estimated  ejection fraction was in the range of 15% to 20%.  Diffuse hypokinesis. There is akinesis of the  mid-apicalinferolateral and inferior myocardium. Doppler  parameters are consistent with restrictive physiology, indicative  of decreased left ventricular diastolic compliance and/or  increased left atrial pressure. - Aortic valve: Mildly calcified annulus. Trileaflet; mildly  calcified leaflets. There was trivial regurgitation. - Mitral valve: There was moderate regurgitation. - Left atrium: The atrium was moderately to severely dilated. - Right ventricle: The cavity size was moderately dilated. Pacer  wire or catheter noted in right ventricle. Systolic function was  moderately reduced. - Right atrium: The atrium was moderately to severely dilated.  Central venous pressure (est): 15 mm Hg. - Tricuspid valve: There was moderate regurgitation. - Pulmonic valve: There was mild regurgitation. - Pulmonary arteries: PA peak pressure: 57 mm Hg (S). - Pericardium, extracardiac: There was no pericardial effusion. Impressions: - Moderate LV chamber dilatation with overall normal wall thickness  and LVEF approximately 15-20%. There is severe diffuse  hypokinesis with regional variation and relative akinesis of the  mid to apical inferior and inferolateral wall. Restrictive  diastolic filling pattern noted with evidence of increased LV  filling pressure. Moderate to severe biatrial enlargement.  Moderate mitral regurgitation. Trivial aortic regurgitation.  Moderate RV dilatation with reduced contraction, device wire  present within the right heart. Moderate tricuspid regurgitation  with PASP estimated 57 mmHg.  02/25/16: LHC Conclusion    1. Nonobstructive coronary disease, nonischemic cardiomyopathy.  2. Mildly elevated RV filling pressure, near-normal PCWP.  3. Low cardiac output.   - I am going to place a PICC and start him on milrinone given low output HF.  - Can  give one more dose of IV Lasix and transition to po tomorrow.  - Follow co-ox and CVP.  - Start heparin gtt  in 8 hrs while INR < 2 and continue warfarin.      DEVICE HISTORY: SJM, CRT-D, implanted 09/25/12 appropriate shocks for VT/VF  Assessment and Plan:   1. NICM, CHF exacerbation     C/w CHF team     Pending milrinone     On BB, dig, hydralazine, nitrate, aldactone  NO DRIVING 6 months  2. VF/VT s/p ICD therapies     Agree with amio gtt/load  3. PAFib hx     INR is 1.68, agree with heparin gtt until therapeutic     In SR currently    Signed, Francis Dowse, PA-C 02/25/2016 4:13 PM  I have seen, examined the patient, and reviewed the above assessment and plan. On exam, RRR.  Changes to above are made where necessary.  Device interrogation reviewed and reveals appropriate therapy in the VF zone.  I do not feel that adjustments to tachy therapies would be beneficial at this time.  Continue IV amiodarone x 36 hours then convert to oral amiodarone 200mg  BID x 4 weeks then 200mg  daily.  Keep K >3.9 and Mg >1.9.  Would consider NOAC given subtherapeutic INR.  No driving x 6 months (pt aware).  I have reviewed CXR which reveals very lateral but somewhat anterior LV lead position.  EKG reveals relatively narrow QRS with BiV pacing.  I would like to perform a more thorough evaluation of his resynchronization therapy tomorrow including evaluation of underlying QRS (non paced) as well as explore additional AV/VV timing and vector options.  I make arrangements for this to be done in am.  This patient is very ill and on inotropes.  He is at risk of decompensation and further arrhythmias.  A high level of decision making was required for this encounter.  CHF service to manage. EP to be available as needed  Please call with questions.  Co Sign: Hillis Range, MD 02/25/2016 10:44 PM

## 2016-02-25 NOTE — Progress Notes (Signed)
TR band removed and pressure dressing applied. Site is level 0, no bleeding or bruising. Pt educated on wrist precautions.

## 2016-02-25 NOTE — Interval H&P Note (Signed)
History and Physical Interval Note:  02/25/2016 2:43 PM  Johnathan Morrison  has presented today for surgery, with the diagnosis of hf  The various methods of treatment have been discussed with the patient and family. After consideration of risks, benefits and other options for treatment, the patient has consented to  Procedure(s): Right/Left Heart Cath and Coronary Angiography (N/A) as a surgical intervention .  The patient's history has been reviewed, patient examined, no change in status, stable for surgery.  I have reviewed the patient's chart and labs.  Questions were answered to the patient's satisfaction.     Idaliz Tinkle [employer]

## 2016-02-25 NOTE — Progress Notes (Signed)
ANTICOAGULATION CONSULT NOTE - follow up  Pharmacy Consult for Coumadin Indication: atrial fibrillation  No Known Allergies  Patient Measurements: Height: 6' (182.9 cm) Weight: 188 lb 9.6 oz (85.548 kg) IBW/kg (Calculated) : 77.6  Vital Signs: Temp: 98.2 F (36.8 C) (07/13 0435) Temp Source: Oral (07/13 0435) BP: 89/61 mmHg (07/13 0600) Pulse Rate: 42 (07/13 0600)  Labs:  Recent Labs  02/23/16 0836 02/23/16 0946 02/23/16 1637 02/23/16 2050 02/24/16 0403 02/24/16 2339 02/25/16 0351  HGB 12.3*  --   --   --  12.3*  --   --   HCT 38.9*  --   --   --  38.6*  --   --   PLT 195  --   --   --  175  --   --   LABPROT  --  22.2*  --   --  18.5*  --  19.8*  INR  --  1.96*  --   --  1.54*  --  1.68*  CREATININE 1.22  --   --   --  1.25* 1.33*  --   TROPONINI 0.05*  --  0.05* 0.04* 0.04*  --   --     Estimated Creatinine Clearance: 58.3 mL/min (by C-G formula based on Cr of 1.33).   Medical History: Past Medical History  Diagnosis Date  . Seizures (HCC)   . Essential hypertension   . Chronic systolic heart failure (HCC)   . Atrial fibrillation (HCC)   . Cardiomyopathy (HCC)   . PE (pulmonary embolism) March 2016  . COPD (chronic obstructive pulmonary disease) (HCC)   . Pulmonary HTN (HCC)     Rt heart cath June 2016  . Diastolic dysfunction     Grade 3  . 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 SAH (subarachnoid hemorrhage) (HCC) Dec 2015  . Chronic renal disease, stage III    Assessment: 70 y.o. male with a hx of PE, afib, CVA, SAH, HTN, CHF, and AICD/PPM insertion who presents to the Emergency Department complaining of recurrent, moderate SOB, worse over the past two days.  Pharmacy consulted to continue dosing of coumadin. Home dose is 5mg  daily.  INR 1.68 this am, no bleeding issues noted. Will give extra tonight. Patient stated he has been taking warfarin for some time, education done.   Goal of Therapy:  INR 2-3 Monitor platelets by  anticoagulation protocol: Yes   Plan:  Coumadin 7.5mg  x 1 today Daily PT/INR Monitor for s/s of bleeding  Sheppard Coil PharmD., BCPS Clinical Pharmacist Pager [phone] 02/25/2016 8:03 AM

## 2016-02-26 ENCOUNTER — Encounter (HOSPITAL_COMMUNITY): Payer: Self-pay | Admitting: Cardiology

## 2016-02-26 DIAGNOSIS — I480 Paroxysmal atrial fibrillation: Secondary | ICD-10-CM

## 2016-02-26 LAB — BASIC METABOLIC PANEL
ANION GAP: 9 (ref 5–15)
BUN: 18 mg/dL (ref 6–20)
CALCIUM: 9.4 mg/dL (ref 8.9–10.3)
CHLORIDE: 100 mmol/L — AB (ref 101–111)
CO2: 27 mmol/L (ref 22–32)
Creatinine, Ser: 1.36 mg/dL — ABNORMAL HIGH (ref 0.61–1.24)
GFR calc non Af Amer: 52 mL/min — ABNORMAL LOW (ref >60)
GLUCOSE: 102 mg/dL — AB (ref 65–99)
POTASSIUM: 4 mmol/L (ref 3.5–5.1)
Sodium: 136 mmol/L (ref 135–145)

## 2016-02-26 LAB — HEPARIN LEVEL (UNFRACTIONATED)
HEPARIN UNFRACTIONATED: 0.42 [IU]/mL (ref 0.30–0.70)
Heparin Unfractionated: 0.25 IU/mL — ABNORMAL LOW (ref 0.30–0.70)

## 2016-02-26 LAB — PROTIME-INR
INR: 1.84 — ABNORMAL HIGH (ref 0.00–1.49)
Prothrombin Time: 21.2 seconds — ABNORMAL HIGH (ref 11.6–15.2)

## 2016-02-26 LAB — CARBOXYHEMOGLOBIN
CARBOXYHEMOGLOBIN: 1.1 % (ref 0.5–1.5)
METHEMOGLOBIN: 0.8 % (ref 0.0–1.5)
O2 SAT: 57.7 %
TOTAL HEMOGLOBIN: 13.4 g/dL — AB (ref 13.5–18.0)

## 2016-02-26 MED ORDER — FUROSEMIDE 10 MG/ML IJ SOLN
80.0000 mg | Freq: Two times a day (BID) | INTRAMUSCULAR | Status: DC
  Administered 2016-02-26 – 2016-02-27 (×3): 80 mg via INTRAVENOUS
  Filled 2016-02-26 (×2): qty 8

## 2016-02-26 MED ORDER — SODIUM CHLORIDE 0.9% FLUSH: 10.0000 mL | INTRAVENOUS | Status: DC | PRN

## 2016-02-26 MED ORDER — WARFARIN SODIUM 7.5 MG PO TABS
7.5000 mg | ORAL_TABLET | Freq: Once | ORAL | Status: AC
  Administered 2016-02-26: 7.5 mg via ORAL
  Filled 2016-02-26: qty 1

## 2016-02-26 MED ORDER — METOLAZONE 2.5 MG PO TABS
2.5000 mg | ORAL_TABLET | Freq: Once | ORAL | Status: AC
  Administered 2016-02-26: 2.5 mg via ORAL
  Filled 2016-02-26: qty 1

## 2016-02-26 MED ORDER — POTASSIUM CHLORIDE CRYS ER 20 MEQ PO TBCR
20.0000 meq | EXTENDED_RELEASE_TABLET | Freq: Once | ORAL | Status: AC
  Administered 2016-02-26: 20 meq via ORAL
  Filled 2016-02-26: qty 1

## 2016-02-26 MED ORDER — LOSARTAN POTASSIUM 25 MG PO TABS
12.5000 mg | ORAL_TABLET | Freq: Every day | ORAL | Status: DC
  Administered 2016-02-26: 12.5 mg via ORAL
  Filled 2016-02-26 (×2): qty 1

## 2016-02-26 NOTE — Progress Notes (Signed)
ANTICOAGULATION CONSULT NOTE - Follow Up Consult  Pharmacy Consult for heparin Indication: atrial fibrillation  Labs:  Recent Labs  02/23/16 0836  02/23/16 1637 02/23/16 2050 02/24/16 0403 02/24/16 2339 02/25/16 0351 02/26/16 0228 02/26/16 0229  HGB 12.3*  --   --   --  12.3*  --   --   --   --   HCT 38.9*  --   --   --  38.6*  --   --   --   --   PLT 195  --   --   --  175  --   --   --   --   LABPROT  --   < >  --   --  18.5*  --  19.8* 21.2*  --   INR  --   < >  --   --  1.54*  --  1.68* 1.84*  --   HEPARINUNFRC  --   --   --   --   --   --   --   --  0.25*  CREATININE 1.22  --   --   --  1.25* 1.33*  --  1.36*  --   TROPONINI 0.05*  --  0.05* 0.04* 0.04*  --   --   --   --   < > = values in this interval not displayed.    Assessment: 70yo male subtherapeutic on heparin with initial dosing while INR is low.  Goal of Therapy:  Heparin level 0.3-0.7 units/ml   Plan:  Will increase heparin gtt slightly to 1400 units/hr and check level in 6hr.  Vernard Gambles, PharmD, BCPS  02/26/2016,4:01 AM

## 2016-02-26 NOTE — Progress Notes (Signed)
The patient's ICD was interrogated further in an attempt to optimize BiVe pacing.  Noted the patient's intrinsic QRS with nonspecific IVCD Patient has a quadripolar lead with phrenic stimulation from pole 2 programming 4 - coil with simultaneous V-V timing and QRS duration 3 -4 configuration noted simultaneous timing QRS  DDD programming noted V-A conduction at , VA conduction also occurred at AV delay of and greater  AAI pacing at 130bpm was 1:1 AAI pacing at 80bpm, appeared to supress his PVCs, PR interval  It was decided with Dr. Johney Frame to leave the device was left programmed AAI at 80bpm  Francis Dowse, PA-C  Hillis Range MD, [facility] 02/26/2016 1:01 PM

## 2016-02-26 NOTE — Progress Notes (Signed)
Patient ID: Johnathan Morrison, male   DOB: Dec 20, 1945, 70 y.o.   MRN: [id number]   SUBJECTIVE: Short of breath this morning.  Coughing.   Seen by EP, device settings changed to AAI as BiV pacing appears to be widening his QRS.   LHC/RHC 7/13 Left Main  No significant disease.      Left Anterior Descending  30% proximal LAD stenosis, 20% mid LAD stenosis.     Ramus Intermedius  Moderate, no significant disease.     Left Circumflex  40-50% ostial OM1 stenosis.     Right Coronary Artery  Luminal irregularities.       Right Heart Pressures RHC Procedural Findings: Hemodynamics (mmHg) RA mean 9 RV 39/10 PA 36/12, mean 24 PCWP mean 13 LV 79/18 AO 82/56  Oxygen saturations: PA 54% AO 96%  Cardiac Output (Fick) 3.94  Cardiac Index (Fick) 1.89   Scheduled Meds: . atorvastatin  40 mg Oral Daily  . carvedilol  6.25 mg Oral BID WC  . digoxin  0.125 mg Oral Daily  . furosemide  80 mg Intravenous BID  . hydrALAZINE  25 mg Oral TID  . isosorbide mononitrate  30 mg Oral Daily  . levETIRAcetam  500 mg Oral Daily  . LORazepam  1 mg Oral QHS  . losartan  12.5 mg Oral Daily  . pantoprazole  40 mg Oral Daily  . potassium chloride SA  40 mEq Oral BID  . sodium chloride flush  3 mL Intravenous Q12H  . sodium chloride flush  3 mL Intravenous Q12H  . sodium chloride flush  3 mL Intravenous Q12H  . spironolactone  12.5 mg Oral Daily  . Warfarin - Pharmacist Dosing Inpatient   Does not apply q1800   Continuous Infusions: . amiodarone 30 mg/hr (02/25/16 1356)  . heparin 1,400 Units/hr (02/26/16 0421)  . milrinone 0.25 mcg/kg/min (02/26/16 0421)   PRN Meds:.sodium chloride, sodium chloride, acetaminophen, albuterol, LORazepam, magnesium hydroxide, ondansetron (ZOFRAN) IV, sodium chloride flush, sodium chloride flush    Filed Vitals:   02/26/16 0400 02/26/16 0500 02/26/16 0600 02/26/16 0700  BP: 102/71 94/62 103/69 103/59  Pulse: 80 76 74 64  Temp: 98.3 F (36.8 C)       TempSrc: Oral     Resp: 23 11 14 12   Height:      Weight: 186 lb 12.8 oz (84.732 kg)     SpO2: 100% 100% 99% 98%    Intake/Output Summary (Last 24 hours) at 02/26/16 0804 Last data filed at 02/26/16 0700  Gross per 24 hour  Intake 2011.73 ml  Output   2125 ml  Net -113.27 ml    LABS: Basic Metabolic Panel:  Recent Labs  40/98/11 2339 02/26/16 0228  NA 134* 136  K 3.9 4.0  CL 101 100*  CO2 25 27  GLUCOSE 101* 102*  BUN 21* 18  CREATININE 1.33* 1.36*  CALCIUM 9.2 9.4   Liver Function Tests: No results for input(s): AST, ALT, ALKPHOS, BILITOT, PROT, ALBUMIN in the last 72 hours. No results for input(s): LIPASE, AMYLASE in the last 72 hours. CBC:  Recent Labs  02/23/16 0836 02/24/16 0403  WBC 3.4* 3.2*  NEUTROABS 1.8  --   HGB 12.3* 12.3*  HCT 38.9* 38.6*  MCV 97.0 95.1  PLT 195 175   Cardiac Enzymes:  Recent Labs  02/23/16 1637 02/23/16 2050 02/24/16 0403  TROPONINI 0.05* 0.04* 0.04*   BNP: Invalid input(s): POCBNP D-Dimer: No results for input(s): DDIMER in the last 72 hours. Hemoglobin  A1C: No results for input(s): HGBA1C in the last 72 hours. Fasting Lipid Panel: No results for input(s): CHOL, HDL, LDLCALC, TRIG, CHOLHDL, LDLDIRECT in the last 72 hours. Thyroid Function Tests:  Recent Labs  02/23/16 0836  TSH 0.138*   Anemia Panel: No results for input(s): VITAMINB12, FOLATE, FERRITIN, TIBC, IRON, RETICCTPCT in the last 72 hours.  RADIOLOGY: Dg Chest 2 View  02/23/2016  CLINICAL DATA:  Shortness of breath for 4 days, worsened over the past 2 days. History of congestive heart failure. EXAM: CHEST  2 VIEW COMPARISON:  Single-view of the chest 02/03/2016. CT chest 02/12/2016. FINDINGS: There is cardiomegaly without pulmonary edema. Trace right pleural effusion is seen and there is mild subsegmental atelectasis in the right lung base. Aeration is markedly improved since the prior plain film. AICD is noted. No pneumothorax. IMPRESSION:  Cardiomegaly and pulmonary vascular congestion. Aeration appears markedly improved since the prior plain film. Trace right pleural effusion and subsegmental atelectasis right lung base. Electronically Signed   By: Drusilla Kanner M.D.   On: 02/23/2016 09:04   Ct Angio Chest Pe W/cm &/or Wo Cm  02/12/2016  CLINICAL DATA:  LEFT side chest pain with shortness of breath for 1 day, history hypertension, pacemaker, CHF EXAM: CT ANGIOGRAPHY CHEST WITH CONTRAST TECHNIQUE: Multidetector CT imaging of the chest was performed using the standard protocol during bolus administration of intravenous contrast. Multiplanar CT image reconstructions and MIPs were obtained to evaluate the vascular anatomy. CONTRAST:  100 cc Isovue 370 IV COMPARISON:  None FINDINGS: Cardiovascular: Few scattered atherosclerotic calcifications aorta and coronary arteries. Upper normal caliber ascending thoracic aorta 3.8 cm diameter. Mild limitations of exam secondary to scattered respiratory motion artifacts. Enlarged central pulmonary arteries. Pulmonary arteries well opacified and grossly patent. No definite evidence of pulmonary embolism. Beam hardening artifacts from LEFT subclavian pacemaker leads in RIGHT atrium, RIGHT ventricle, and coronary sinus. Enlargement of cardiac chambers and cardiac size. Mediastinum/Nodes: Esophagus unremarkable. Scattered normal size mediastinal lymph nodes. Lungs/Pleura: Small RIGHT pleural effusion. Scattered respiratory motion artifacts. Minimal compressive atelectasis RIGHT lower lobe. Peripheral interstitial changes at both lung bases. No additional infiltrate, pneumothorax, or LEFT pleural effusion. Upper Abdomen: Distended IVC and hepatic veins with reflux of contrast question elevated RIGHT heart pressure versus sequela power injection. Remaining upper abdomen normal. Musculoskeletal: No acute osseous findings. Review of the MIP images confirms the above findings. IMPRESSION: No gross evidence of pulmonary  embolism identified on exam slightly limited by rash to motion artifacts. Question pulmonary arterial hypertension. Small RIGHT pleural effusion with minimal basilar atelectasis. Enlargement of cardiac chambers with evidence of prior pacemaker placement. Electronically Signed   By: Ulyses Southward M.D.   On: 02/12/2016 14:42   Dg Chest Port 1 View  02/03/2016  CLINICAL DATA:  70 year old male with shortness of breath EXAM: PORTABLE CHEST 1 VIEW COMPARISON:  None. FINDINGS: Single portable view of the chest demonstrate cardiomegaly with central vascular and interstitial prominence compatible with congestive changes. There bibasilar atelectatic changes of the lungs. There is no focal consolidation or pneumothorax. No significant pleural effusion. Left pectoral AICD device. No acute osseous pathology. IMPRESSION: Cardiomegaly with congestive changes. Electronically Signed   By: Elgie Collard M.D.   On: 02/03/2016 05:48    PHYSICAL EXAM General: NAD Neck: JVP 12 cm, no thyromegaly or thyroid nodule.  Lungs: Crackles at bases bilaterally.  CV: Lateral PMI.  Heart regular S1/S2, no S3/S4, no murmur.  No peripheral edema.  No carotid bruit.  Normal pedal pulses.  Abdomen:  Soft, nontender, no hepatosplenomegaly, no distention.  Neurologic: Alert and oriented x 3.  Psych: Normal affect. Extremities: No clubbing or cyanosis.   TELEMETRY: Reviewed telemetry pt in NSR  ASSESSMENT AND PLAN: 70 yo admitted with VT/ICD discharge and acute/chronic systolic CHF. We do not have full records on him as he recently moved to [place].   1. VT: Multiple episodes of VT, shocked on 7/11. May have passed out but he does not remember. Used to be on amiodarone but had been off for a while now and not sure why. Has St Jude CRT-D device.  - Amiodarone restarted, continue IV today and convert over to 200 mg po bid tomorrow. - Should not drive x 6 months.  2. Acute on chronic systolic CHF: Nonischemic cardiomyopathy.  Followed in [place] in the past, we are trying to get records. Mother has CHF. LHC this admission showed no coronary disease.  RHC showed only mildly elevated filling pressures with low cardiac output.  Echo this admission with moderate LV dilation, EF 15-20% with some regionality in wall motion abnormalities. Despite reasonable filling pressures at cath, he is short of breath this morning and appears volume overloaded.  He was started on milrinone given low output.  - I will increase Lasix to 80 mg IV bid today.  - Continue milrinone 0.25 mcg/kg/min - Needs PICC, will follow co-ox and CVP.  - Will start losartan 12.5 mg daily with stable BP.  - Coreg has been decreased to 6.25 mg bid.  - Continue digoxin 0.125, level ok.  - Continue hydralazine 25 mg tid and Imdur 30 daily.  - Continue spironolactone 12.5 daily.  - Seen by EP, now set to AAI as BiV pacing appeared to widen QRS.  - I am concerned that he may be nearing end stage HF.  May need to consider LVAD in future.  3. H/o PAF: He is in NSR, on warfarin. He will restart amiodarone.  4. H/o PE: Warfarin.  5. 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 Heparin gtt while INR subtherapeutic.   [name] 02/26/2016 8:13 AM

## 2016-02-26 NOTE — Progress Notes (Signed)
ANTICOAGULATION CONSULT NOTE - follow up  Pharmacy Consult for Coumadin / Heparin Indication: atrial fibrillation  No Known Allergies  Patient Measurements: Height: 6' (182.9 cm) Weight: 186 lb 12.8 oz (84.732 kg) IBW/kg (Calculated) : 77.6   Assessment: 70 y.o. male with a hx of PE, afib, CVA, SAH, HTN, CHF, and AICD/PPM insertion who presents to the Emergency Department complaining of recurrent, moderate SOB, worse over the past two days.   Home Coumadin dose is 5mg  daily.  S/p cath 7/13 with heparin and coumadin resumed.   INR up to 1.8 this am, bridging patient with heparin d/t hx of cva and afib. Heparin level at goal on 1400 units/hr. Hope INR will be over 2 in am so can d/c heparin.  Goal of Therapy:  INR 2-3 Monitor platelets by anticoagulation protocol: Yes  Heparin level = 0.3-0.7   Plan:  Coumadin 7.5mg  x 1 again today Heparin at 1400 units / hr  Daily PT/INR/CBC/Heparin level Monitor for s/s of bleeding  Thank you,  Sheppard Coil PharmD., BCPS Clinical Pharmacist Pager [phone] 02/26/2016 11:03 AM

## 2016-02-26 NOTE — Progress Notes (Signed)
Peripherally Inserted Central Catheter/Midline Placement  The IV Nurse has discussed with the patient and/or persons authorized to consent for the patient, the purpose of this procedure and the potential benefits and risks involved with this procedure.  The benefits include less needle sticks, lab draws from the catheter and patient may be discharged home with the catheter.  Risks include, but not limited to, infection, bleeding, blood clot (thrombus formation), and puncture of an artery; nerve damage and irregular heat beat.  Alternatives to this procedure were also discussed.  PICC/Midline Placement Documentation        Johnathan Morrison 02/26/2016, 12:47 PM

## 2016-02-27 ENCOUNTER — Inpatient Hospital Stay (HOSPITAL_COMMUNITY): Payer: Medicare Other

## 2016-02-27 DIAGNOSIS — N183 Chronic kidney disease, stage 3 (moderate): Secondary | ICD-10-CM

## 2016-02-27 LAB — PROTIME-INR
INR: 2.08 — ABNORMAL HIGH (ref 0.00–1.49)
Prothrombin Time: 23.3 seconds — ABNORMAL HIGH (ref 11.6–15.2)

## 2016-02-27 LAB — BASIC METABOLIC PANEL
ANION GAP: 12 (ref 5–15)
BUN: 19 mg/dL (ref 6–20)
CALCIUM: 9.8 mg/dL (ref 8.9–10.3)
CO2: 28 mmol/L (ref 22–32)
CREATININE: 1.49 mg/dL — AB (ref 0.61–1.24)
Chloride: 93 mmol/L — ABNORMAL LOW (ref 101–111)
GFR calc Af Amer: 54 mL/min — ABNORMAL LOW (ref >60)
GFR, EST NON AFRICAN AMERICAN: 46 mL/min — AB (ref >60)
GLUCOSE: 206 mg/dL — AB (ref 65–99)
Potassium: 4.1 mmol/L (ref 3.5–5.1)
Sodium: 133 mmol/L — ABNORMAL LOW (ref 135–145)

## 2016-02-27 LAB — CBC
HEMATOCRIT: 41.3 % (ref 39.0–52.0)
Hemoglobin: 13.7 g/dL (ref 13.0–17.0)
MCH: 31.1 pg (ref 26.0–34.0)
MCHC: 33.2 g/dL (ref 30.0–36.0)
MCV: 93.7 fL (ref 78.0–100.0)
Platelets: 229 10*3/uL (ref 150–400)
RBC: 4.41 MIL/uL (ref 4.22–5.81)
RDW: 14.1 % (ref 11.5–15.5)
WBC: 4.7 10*3/uL (ref 4.0–10.5)

## 2016-02-27 LAB — CARBOXYHEMOGLOBIN
Carboxyhemoglobin: 1.8 % — ABNORMAL HIGH (ref 0.5–1.5)
Methemoglobin: 0.7 % (ref 0.0–1.5)
O2 SAT: 70.4 %
Total hemoglobin: 14 g/dL (ref 13.5–18.0)

## 2016-02-27 LAB — HEPARIN LEVEL (UNFRACTIONATED): Heparin Unfractionated: 0.68 IU/mL (ref 0.30–0.70)

## 2016-02-27 MED ORDER — TORSEMIDE 20 MG PO TABS
40.0000 mg | ORAL_TABLET | Freq: Every day | ORAL | Status: DC
  Administered 2016-02-28: 40 mg via ORAL
  Filled 2016-02-27 (×2): qty 2

## 2016-02-27 MED ORDER — AMIODARONE HCL 200 MG PO TABS
200.0000 mg | ORAL_TABLET | Freq: Two times a day (BID) | ORAL | Status: DC
Start: 2016-02-27 — End: 2016-03-01
  Administered 2016-02-27 – 2016-03-01 (×7): 200 mg via ORAL
  Filled 2016-02-27 (×7): qty 1

## 2016-02-27 MED ORDER — WARFARIN SODIUM 5 MG PO TABS
5.0000 mg | ORAL_TABLET | Freq: Once | ORAL | Status: AC
  Administered 2016-02-27: 5 mg via ORAL
  Filled 2016-02-27: qty 1

## 2016-02-27 MED ORDER — LOSARTAN POTASSIUM 25 MG PO TABS
12.5000 mg | ORAL_TABLET | Freq: Two times a day (BID) | ORAL | Status: DC
  Administered 2016-02-27 – 2016-02-28 (×2): 12.5 mg via ORAL
  Filled 2016-02-27 (×2): qty 1

## 2016-02-27 NOTE — Progress Notes (Signed)
ANTICOAGULATION CONSULT NOTE - Follow Up Consult  Pharmacy Consult for Coumadin/Heparin Indication: atrial fibrillation, h/o PE  No Known Allergies  Patient Measurements: Height: 6' (182.9 cm) Weight: 182 lb 8 oz (82.781 kg) IBW/kg (Calculated) : 77.6  Vital Signs: Temp: 98.1 F (36.7 C) (07/15 0319) Temp Source: Oral (07/15 0319) BP: 103/69 mmHg (07/15 0700) Pulse Rate: 79 (07/15 0700)  Labs:  Recent Labs  02/24/16 2339 02/25/16 0351 02/26/16 0228 02/26/16 0229 02/26/16 1006 02/27/16 0330  HGB  --   --   --   --   --  13.7  HCT  --   --   --   --   --  41.3  PLT  --   --   --   --   --  229  LABPROT  --  19.8* 21.2*  --   --  23.3*  INR  --  1.68* 1.84*  --   --  2.08*  HEPARINUNFRC  --   --   --  0.25* 0.42 0.68  CREATININE 1.33*  --  1.36*  --   --  1.49*    Estimated Creatinine Clearance: 52.1 mL/min (by C-G formula based on Cr of 1.49).   Medications:  Scheduled:  . atorvastatin  40 mg Oral Daily  . carvedilol  6.25 mg Oral BID WC  . digoxin  0.125 mg Oral Daily  . furosemide  80 mg Intravenous BID  . hydrALAZINE  25 mg Oral TID  . isosorbide mononitrate  30 mg Oral Daily  . levETIRAcetam  500 mg Oral Daily  . LORazepam  1 mg Oral QHS  . losartan  12.5 mg Oral Daily  . pantoprazole  40 mg Oral Daily  . potassium chloride SA  40 mEq Oral BID  . sodium chloride flush  3 mL Intravenous Q12H  . sodium chloride flush  3 mL Intravenous Q12H  . sodium chloride flush  3 mL Intravenous Q12H  . spironolactone  12.5 mg Oral Daily  . Warfarin - Pharmacist Dosing Inpatient   Does not apply q1800   Infusions:  . amiodarone 30 mg/hr (02/26/16 2100)  . heparin 1,400 Units/hr (02/26/16 2100)  . milrinone 0.25 mcg/kg/min (02/26/16 2100)    Assessment: 70 y.o. male with a hx of PE, afib, CVA, SAH, HTN, CHF, and AICD/PPM insertion who presents to the Emergency Department on 02/23/2016 complaining of recurrent, moderate SOB, worse over the past two days.On  admission, INR slightly SUBtherapeutic at 1.96.   S/p cath 7/13 with clean coronaries and heparin bridge to coumadin resumed. INR now up to therapeutic level at 2.08 and hep level remains at goal but will be d/c'd since INR at goal. CBC remains wnl and stable with no reported significant s/s bleeding. Will need to follow INR closely since amiodarone initiated this admission which may potentiate the effects of warfarin.   Home Coumadin dose: 5mg  daily  Goal of Therapy:  INR 2-3 Monitor platelets by anticoagulation protocol: Yes   Plan:  D/c heparin gtt Coumadin 5 mg po x 1 tonight  Daily INR Monitor CBC and for s/s bleeding  Mayah Urquidi K. Bonnye Fava, PharmD, BCPS, CPP Clinical Pharmacist Pager: [phone] Phone: [phone] 02/27/2016 7:52 AM

## 2016-02-27 NOTE — Progress Notes (Signed)
Patient ID: Johnathan Morrison, male   DOB: [birth year], 70 y.o.   MRN: [id number]   SUBJECTIVE: Good diuresis yesterday, weight down 4 lbs.  Creatinine slightly higher.  Co-ox 70%, CVP 7 this morning.    Seen by EP, device settings changed to AAI as BiV pacing appears to be widening his QRS.   LHC/RHC 7/13 Left Main  No significant disease.      Left Anterior Descending  30% proximal LAD stenosis, 20% mid LAD stenosis.     Ramus Intermedius  Moderate, no significant disease.     Left Circumflex  40-50% ostial OM1 stenosis.     Right Coronary Artery  Luminal irregularities.       Right Heart Pressures RHC Procedural Findings: Hemodynamics (mmHg) RA mean 9 RV 39/10 PA 36/12, mean 24 PCWP mean 13 LV 79/18 AO 82/56  Oxygen saturations: PA 54% AO 96%  Cardiac Output (Fick) 3.94  Cardiac Index (Fick) 1.89   Scheduled Meds: . atorvastatin  40 mg Oral Daily  . carvedilol  6.25 mg Oral BID WC  . digoxin  0.125 mg Oral Daily  . hydrALAZINE  25 mg Oral TID  . isosorbide mononitrate  30 mg Oral Daily  . levETIRAcetam  500 mg Oral Daily  . LORazepam  1 mg Oral QHS  . losartan  12.5 mg Oral BID  . pantoprazole  40 mg Oral Daily  . potassium chloride SA  40 mEq Oral BID  . sodium chloride flush  3 mL Intravenous Q12H  . sodium chloride flush  3 mL Intravenous Q12H  . sodium chloride flush  3 mL Intravenous Q12H  . spironolactone  12.5 mg Oral Daily  . [START ON 02/28/2016] torsemide  40 mg Oral Daily  . warfarin  5 mg Oral ONCE-1800  . Warfarin - Pharmacist Dosing Inpatient   Does not apply q1800   Continuous Infusions: . amiodarone 30 mg/hr (02/26/16 2100)  . milrinone 0.25 mcg/kg/min (02/26/16 2100)   PRN Meds:.sodium chloride, sodium chloride, acetaminophen, albuterol, LORazepam, magnesium hydroxide, ondansetron (ZOFRAN) IV, sodium chloride flush, sodium chloride flush, sodium chloride flush    Filed Vitals:   02/27/16 0600 02/27/16 0700 02/27/16 0750  02/27/16 0800  BP: 91/65 103/69  91/65  Pulse: 84 79  80  Temp:   98.2 F (36.8 C)   TempSrc:   Oral   Resp: 10 23  22   Height:      Weight:      SpO2: 96% 97%  99%    Intake/Output Summary (Last 24 hours) at 02/27/16 1001 Last data filed at 02/27/16 0948  Gross per 24 hour  Intake   3185 ml  Output   4326 ml  Net  -1141 ml    LABS: Basic Metabolic Panel:  Recent Labs  40/98/11 0228 02/27/16 0330  NA 136 133*  K 4.0 4.1  CL 100* 93*  CO2 27 28  GLUCOSE 102* 206*  BUN 18 19  CREATININE 1.36* 1.49*  CALCIUM 9.4 9.8   Liver Function Tests: No results for input(s): AST, ALT, ALKPHOS, BILITOT, PROT, ALBUMIN in the last 72 hours. No results for input(s): LIPASE, AMYLASE in the last 72 hours. CBC:  Recent Labs  02/27/16 0330  WBC 4.7  HGB 13.7  HCT 41.3  MCV 93.7  PLT 229   Cardiac Enzymes: No results for input(s): CKTOTAL, CKMB, CKMBINDEX, TROPONINI in the last 72 hours. BNP: Invalid input(s): POCBNP D-Dimer: No results for input(s): DDIMER in the last 72 hours. Hemoglobin A1C:  No results for input(s): HGBA1C in the last 72 hours. Fasting Lipid Panel: No results for input(s): CHOL, HDL, LDLCALC, TRIG, CHOLHDL, LDLDIRECT in the last 72 hours. Thyroid Function Tests: No results for input(s): TSH, T4TOTAL, T3FREE, THYROIDAB in the last 72 hours.  Invalid input(s): FREET3 Anemia Panel: No results for input(s): VITAMINB12, FOLATE, FERRITIN, TIBC, IRON, RETICCTPCT in the last 72 hours.  RADIOLOGY: Dg Chest 2 View  02/23/2016  CLINICAL DATA:  Shortness of breath for 4 days, worsened over the past 2 days. History of congestive heart failure. EXAM: CHEST  2 VIEW COMPARISON:  Single-view of the chest 02/03/2016. CT chest 02/12/2016. FINDINGS: There is cardiomegaly without pulmonary edema. Trace right pleural effusion is seen and there is mild subsegmental atelectasis in the right lung base. Aeration is markedly improved since the prior plain film. AICD is noted.  No pneumothorax. IMPRESSION: Cardiomegaly and pulmonary vascular congestion. Aeration appears markedly improved since the prior plain film. Trace right pleural effusion and subsegmental atelectasis right lung base. Electronically Signed   By: Drusilla Kanner M.D.   On: 02/23/2016 09:04   Ct Angio Chest Pe W/cm &/or Wo Cm  02/12/2016  CLINICAL DATA:  LEFT side chest pain with shortness of breath for 1 day, history hypertension, pacemaker, CHF EXAM: CT ANGIOGRAPHY CHEST WITH CONTRAST TECHNIQUE: Multidetector CT imaging of the chest was performed using the standard protocol during bolus administration of intravenous contrast. Multiplanar CT image reconstructions and MIPs were obtained to evaluate the vascular anatomy. CONTRAST:  100 cc Isovue 370 IV COMPARISON:  None FINDINGS: Cardiovascular: Few scattered atherosclerotic calcifications aorta and coronary arteries. Upper normal caliber ascending thoracic aorta 3.8 cm diameter. Mild limitations of exam secondary to scattered respiratory motion artifacts. Enlarged central pulmonary arteries. Pulmonary arteries well opacified and grossly patent. No definite evidence of pulmonary embolism. Beam hardening artifacts from LEFT subclavian pacemaker leads in RIGHT atrium, RIGHT ventricle, and coronary sinus. Enlargement of cardiac chambers and cardiac size. Mediastinum/Nodes: Esophagus unremarkable. Scattered normal size mediastinal lymph nodes. Lungs/Pleura: Small RIGHT pleural effusion. Scattered respiratory motion artifacts. Minimal compressive atelectasis RIGHT lower lobe. Peripheral interstitial changes at both lung bases. No additional infiltrate, pneumothorax, or LEFT pleural effusion. Upper Abdomen: Distended IVC and hepatic veins with reflux of contrast question elevated RIGHT heart pressure versus sequela power injection. Remaining upper abdomen normal. Musculoskeletal: No acute osseous findings. Review of the MIP images confirms the above findings. IMPRESSION: No  gross evidence of pulmonary embolism identified on exam slightly limited by rash to motion artifacts. Question pulmonary arterial hypertension. Small RIGHT pleural effusion with minimal basilar atelectasis. Enlargement of cardiac chambers with evidence of prior pacemaker placement. Electronically Signed   By: Ulyses Southward M.D.   On: 02/12/2016 14:42   Dg Chest Port 1 View  02/03/2016  CLINICAL DATA:  70 year old male with shortness of breath EXAM: PORTABLE CHEST 1 VIEW COMPARISON:  None. FINDINGS: Single portable view of the chest demonstrate cardiomegaly with central vascular and interstitial prominence compatible with congestive changes. There bibasilar atelectatic changes of the lungs. There is no focal consolidation or pneumothorax. No significant pleural effusion. Left pectoral AICD device. No acute osseous pathology. IMPRESSION: Cardiomegaly with congestive changes. Electronically Signed   By: Elgie Collard M.D.   On: 02/03/2016 05:48    PHYSICAL EXAM General: NAD Neck: JVP not elevated, no thyromegaly or thyroid nodule.  Lungs: Crackles at bases bilaterally.  CV: Lateral PMI.  Heart regular S1/S2, no S3/S4, no murmur.  No peripheral edema.  No carotid bruit.  Normal pedal pulses.  Abdomen: Soft, nontender, no hepatosplenomegaly, no distention.  Neurologic: Alert and oriented x 3.  Psych: Normal affect. Extremities: No clubbing or cyanosis.   TELEMETRY: Reviewed telemetry pt in NSR  ASSESSMENT AND PLAN: 70 yo admitted with VT/ICD discharge and acute/chronic systolic CHF. We do not have full records on him as he recently moved to [place].   1. VT: Multiple episodes of VT, shocked on 7/11. May have passed out but he does not remember. Used to be on amiodarone but had been off for a while now and not sure why. Has St Jude CRT-D device.  - Amiodarone restarted, transition to po today.  - Should not drive x 6 months.  2. Acute on chronic systolic CHF: Nonischemic cardiomyopathy.  Followed in [place] in the past, we are trying to get records. Mother has CHF. LHC this admission showed no coronary disease.  RHC showed only mildly elevated filling pressures with low cardiac output.  Echo this admission with moderate LV dilation, EF 15-20% with some regionality in wall motion abnormalities. He was started on milrinone given low output. CVP 7 this morning with co-ox 70%.  Weight down 4 lbs.  - Transition to torsemide 40 mg daily, start tomorrow as he got a dose of IV Lasix this morning.  - Decrease milrinone to 0.125 mcg/kg/min.   - Continue to follow co-ox and CVP.  - Increase losartan to 12.5 mg bid.  - Coreg has been decreased to 6.25 mg bid.  - Continue digoxin 0.125, level ok.  - Continue hydralazine 25 mg tid and Imdur 30 daily.  - Continue spironolactone 12.5 daily.  - Seen by EP, now set to AAI as BiV pacing appeared to widen QRS.  - I am concerned that he may be nearing end stage HF.  May need to consider LVAD in future.  3. H/o PAF: He is in NSR, on warfarin. Back on amiodarone.  4. H/o PE: Warfarin.  5. 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 INR therapeutic, stop heparin.    [name] 02/27/2016 10:01 AM

## 2016-02-28 DIAGNOSIS — N179 Acute kidney failure, unspecified: Secondary | ICD-10-CM

## 2016-02-28 LAB — CBC
HCT: 43.8 % (ref 39.0–52.0)
Hemoglobin: 14.4 g/dL (ref 13.0–17.0)
MCH: 30.8 pg (ref 26.0–34.0)
MCHC: 32.9 g/dL (ref 30.0–36.0)
MCV: 93.8 fL (ref 78.0–100.0)
PLATELETS: 242 10*3/uL (ref 150–400)
RBC: 4.67 MIL/uL (ref 4.22–5.81)
RDW: 13.8 % (ref 11.5–15.5)
WBC: 4.6 10*3/uL (ref 4.0–10.5)

## 2016-02-28 LAB — BASIC METABOLIC PANEL
ANION GAP: 9 (ref 5–15)
Anion gap: 7 (ref 5–15)
BUN: 28 mg/dL — AB (ref 6–20)
BUN: 28 mg/dL — AB (ref 6–20)
CALCIUM: 9.9 mg/dL (ref 8.9–10.3)
CHLORIDE: 95 mmol/L — AB (ref 101–111)
CO2: 27 mmol/L (ref 22–32)
CO2: 28 mmol/L (ref 22–32)
CREATININE: 1.7 mg/dL — AB (ref 0.61–1.24)
Calcium: 9.7 mg/dL (ref 8.9–10.3)
Chloride: 95 mmol/L — ABNORMAL LOW (ref 101–111)
Creatinine, Ser: 1.72 mg/dL — ABNORMAL HIGH (ref 0.61–1.24)
GFR calc non Af Amer: 40 mL/min — ABNORMAL LOW (ref >60)
GFR, EST AFRICAN AMERICAN: 45 mL/min — AB (ref >60)
GFR, EST AFRICAN AMERICAN: 46 mL/min — AB (ref >60)
GFR, EST NON AFRICAN AMERICAN: 39 mL/min — AB (ref >60)
Glucose, Bld: 100 mg/dL — ABNORMAL HIGH (ref 65–99)
Glucose, Bld: 141 mg/dL — ABNORMAL HIGH (ref 65–99)
POTASSIUM: 5 mmol/L (ref 3.5–5.1)
Potassium: 5 mmol/L (ref 3.5–5.1)
SODIUM: 131 mmol/L — AB (ref 135–145)
SODIUM: 130 mmol/L — AB (ref 135–145)

## 2016-02-28 LAB — CARBOXYHEMOGLOBIN
CARBOXYHEMOGLOBIN: 1.9 % — AB (ref 0.5–1.5)
METHEMOGLOBIN: 0.6 % (ref 0.0–1.5)
O2 SAT: 75.4 %
TOTAL HEMOGLOBIN: 14.7 g/dL (ref 13.5–18.0)

## 2016-02-28 LAB — PROTIME-INR
INR: 2.32 — ABNORMAL HIGH (ref 0.00–1.49)
Prothrombin Time: 25.2 seconds — ABNORMAL HIGH (ref 11.6–15.2)

## 2016-02-28 LAB — DIGOXIN LEVEL: Digoxin Level: 0.4 ng/mL — ABNORMAL LOW (ref 0.8–2.0)

## 2016-02-28 MED ORDER — MORPHINE SULFATE (PF) 2 MG/ML IV SOLN
2.0000 mg | Freq: Once | INTRAVENOUS | Status: AC
  Administered 2016-02-28: 2 mg via INTRAVENOUS
  Filled 2016-02-28: qty 1

## 2016-02-28 MED ORDER — WARFARIN SODIUM 5 MG PO TABS
5.0000 mg | ORAL_TABLET | Freq: Once | ORAL | Status: AC
  Administered 2016-02-28: 5 mg via ORAL
  Filled 2016-02-28: qty 1

## 2016-02-28 NOTE — Progress Notes (Addendum)
Patient ID: Johnathan Morrison, male   DOB: August 30, 1945, 70 y.o.   MRN: [id number]   SUBJECTIVE: Transitioned to po torsemide.  Creatinine higher again at 1.72.  Co-ox 75%.  He denies dyspnea, seems to be doing well overall now.   Seen by EP, device settings changed to AAI as BiV pacing appears to be widening his QRS.   LHC/RHC 7/13 Left Main  No significant disease.      Left Anterior Descending  30% proximal LAD stenosis, 20% mid LAD stenosis.     Ramus Intermedius  Moderate, no significant disease.     Left Circumflex  40-50% ostial OM1 stenosis.     Right Coronary Artery  Luminal irregularities.       Right Heart Pressures RHC Procedural Findings: Hemodynamics (mmHg) RA mean 9 RV 39/10 PA 36/12, mean 24 PCWP mean 13 LV 79/18 AO 82/56  Oxygen saturations: PA 54% AO 96%  Cardiac Output (Fick) 3.94  Cardiac Index (Fick) 1.89   Scheduled Meds: . amiodarone  200 mg Oral BID  . atorvastatin  40 mg Oral Daily  . carvedilol  6.25 mg Oral BID WC  . digoxin  0.125 mg Oral Daily  . hydrALAZINE  25 mg Oral TID  . isosorbide mononitrate  30 mg Oral Daily  . levETIRAcetam  500 mg Oral Daily  . LORazepam  1 mg Oral QHS  . pantoprazole  40 mg Oral Daily  . sodium chloride flush  3 mL Intravenous Q12H  . sodium chloride flush  3 mL Intravenous Q12H  . sodium chloride flush  3 mL Intravenous Q12H  . spironolactone  12.5 mg Oral Daily  . Warfarin - Pharmacist Dosing Inpatient   Does not apply q1800   Continuous Infusions:   PRN Meds:.sodium chloride, sodium chloride, acetaminophen, albuterol, LORazepam, magnesium hydroxide, ondansetron (ZOFRAN) IV, sodium chloride flush, sodium chloride flush, sodium chloride flush    Filed Vitals:   02/28/16 0000 02/28/16 0400 02/28/16 0443 02/28/16 0821  BP:   93/67 111/67  Pulse: 80 80 80 85  Temp:   97.9 F (36.6 C) 98.4 F (36.9 C)  TempSrc:   Oral Oral  Resp: 0 Height:      Weight:   182 lb 12.2 oz (82.9  kg)   SpO2: 95% 93% 94% 96%    Intake/Output Summary (Last 24 hours) at 02/28/16 0958 Last data filed at 02/28/16 0841  Gross per 24 hour  Intake  603.5 ml  Output   1200 ml  Net -596.5 ml    LABS: Basic Metabolic Panel:  Recent Labs  40/98/11 0330 02/28/16 0515  NA 133* 131*  K 4.1 5.0  CL 93* 95*  CO2 28 27  GLUCOSE 206* 100*  BUN 19 28*  CREATININE 1.49* 1.72*  CALCIUM 9.8 9.7   Liver Function Tests: No results for input(s): AST, ALT, ALKPHOS, BILITOT, PROT, ALBUMIN in the last 72 hours. No results for input(s): LIPASE, AMYLASE in the last 72 hours. CBC:  Recent Labs  02/27/16 0330 02/28/16 0515  WBC 4.7 4.6  HGB 13.7 14.4  HCT 41.3 43.8  MCV 93.7 93.8  PLT 229 242   Cardiac Enzymes: No results for input(s): CKTOTAL, CKMB, CKMBINDEX, TROPONINI in the last 72 hours. BNP: Invalid input(s): POCBNP D-Dimer: No results for input(s): DDIMER in the last 72 hours. Hemoglobin A1C: No results for input(s): HGBA1C in the last 72 hours. Fasting Lipid Panel: No results for input(s): CHOL, HDL, LDLCALC, TRIG, CHOLHDL, LDLDIRECT in  the last 72 hours. Thyroid Function Tests: No results for input(s): TSH, T4TOTAL, T3FREE, THYROIDAB in the last 72 hours.  Invalid input(s): FREET3 Anemia Panel: No results for input(s): VITAMINB12, FOLATE, FERRITIN, TIBC, IRON, RETICCTPCT in the last 72 hours.  RADIOLOGY: Dg Chest 2 View  02/23/2016  CLINICAL DATA:  Shortness of breath for 4 days, worsened over the past 2 days. History of congestive heart failure. EXAM: CHEST  2 VIEW COMPARISON:  Single-view of the chest 02/03/2016. CT chest 02/12/2016. FINDINGS: There is cardiomegaly without pulmonary edema. Trace right pleural effusion is seen and there is mild subsegmental atelectasis in the right lung base. Aeration is markedly improved since the prior plain film. AICD is noted. No pneumothorax. IMPRESSION: Cardiomegaly and pulmonary vascular congestion. Aeration appears markedly  improved since the prior plain film. Trace right pleural effusion and subsegmental atelectasis right lung base. Electronically Signed   By: Drusilla Kanner M.D.   On: 02/23/2016 09:04   Ct Angio Chest Pe W/cm &/or Wo Cm  02/12/2016  CLINICAL DATA:  LEFT side chest pain with shortness of breath for 1 day, history hypertension, pacemaker, CHF EXAM: CT ANGIOGRAPHY CHEST WITH CONTRAST TECHNIQUE: Multidetector CT imaging of the chest was performed using the standard protocol during bolus administration of intravenous contrast. Multiplanar CT image reconstructions and MIPs were obtained to evaluate the vascular anatomy. CONTRAST:  100 cc Isovue 370 IV COMPARISON:  None FINDINGS: Cardiovascular: Few scattered atherosclerotic calcifications aorta and coronary arteries. Upper normal caliber ascending thoracic aorta 3.8 cm diameter. Mild limitations of exam secondary to scattered respiratory motion artifacts. Enlarged central pulmonary arteries. Pulmonary arteries well opacified and grossly patent. No definite evidence of pulmonary embolism. Beam hardening artifacts from LEFT subclavian pacemaker leads in RIGHT atrium, RIGHT ventricle, and coronary sinus. Enlargement of cardiac chambers and cardiac size. Mediastinum/Nodes: Esophagus unremarkable. Scattered normal size mediastinal lymph nodes. Lungs/Pleura: Small RIGHT pleural effusion. Scattered respiratory motion artifacts. Minimal compressive atelectasis RIGHT lower lobe. Peripheral interstitial changes at both lung bases. No additional infiltrate, pneumothorax, or LEFT pleural effusion. Upper Abdomen: Distended IVC and hepatic veins with reflux of contrast question elevated RIGHT heart pressure versus sequela power injection. Remaining upper abdomen normal. Musculoskeletal: No acute osseous findings. Review of the MIP images confirms the above findings. IMPRESSION: No gross evidence of pulmonary embolism identified on exam slightly limited by rash to motion artifacts.  Question pulmonary arterial hypertension. Small RIGHT pleural effusion with minimal basilar atelectasis. Enlargement of cardiac chambers with evidence of prior pacemaker placement. Electronically Signed   By: Ulyses Southward M.D.   On: 02/12/2016 14:42   Dg Chest Port 1 View  02/27/2016  CLINICAL DATA:  CHF. EXAM: PORTABLE CHEST 1 VIEW COMPARISON:  02/23/2016. FINDINGS: 1018 hours. The lungs are clear wiithout focal pneumonia, edema, pneumothorax or pleural effusion. The cardio pericardial silhouette is enlarged. Left permanent pacemaker remains in place. The visualized bony structures of the thorax are intact. Telemetry leads overlie the chest. IMPRESSION: Interval improvement in vascular congestion.  No acute findings. Electronically Signed   By: Kennith Center M.D.   On: 02/27/2016 15:09   Dg Chest Port 1 View  02/03/2016  CLINICAL DATA:  70 year old male with shortness of breath EXAM: PORTABLE CHEST 1 VIEW COMPARISON:  None. FINDINGS: Single portable view of the chest demonstrate cardiomegaly with central vascular and interstitial prominence compatible with congestive changes. There bibasilar atelectatic changes of the lungs. There is no focal consolidation or pneumothorax. No significant pleural effusion. Left pectoral AICD device. No acute osseous  pathology. IMPRESSION: Cardiomegaly with congestive changes. Electronically Signed   By: Elgie Collard M.D.   On: 02/03/2016 05:48    PHYSICAL EXAM General: NAD Neck: JVP not elevated, no thyromegaly or thyroid nodule.  Lungs: Crackles at bases bilaterally.  CV: Lateral PMI.  Heart regular S1/S2, no S3/S4, no murmur.  No peripheral edema.  No carotid bruit.  Normal pedal pulses.  Abdomen: Soft, nontender, no hepatosplenomegaly, no distention.  Neurologic: Alert and oriented x 3.  Psych: Normal affect. Extremities: No clubbing or cyanosis.   TELEMETRY: Reviewed telemetry pt in NSR  ASSESSMENT AND PLAN: 70 yo admitted with VT/ICD discharge and  acute/chronic systolic CHF. We do not have full records on him as he recently moved to [place].   1. VT: Multiple episodes of VT, shocked on 7/11. May have passed out but he does not remember. Used to be on amiodarone but had been off for a while now and not sure why. Has St Jude CRT-D device.  - Amiodarone restarted, now on po.  - Should not drive x 6 months.  2. Acute on chronic systolic CHF: Nonischemic cardiomyopathy. Followed in [place] in the past, we are trying to get records. Mother has CHF. LHC this admission showed no coronary disease.  RHC showed only mildly elevated filling pressures with low cardiac output.  Echo this admission with moderate LV dilation, EF 15-20% with some regionality in wall motion abnormalities. He was started on milrinone given low output. CVP 3-4 this morning with co-ox 75%.    - Hold torsemide for now (already got a dose), will need to restart when creatinine stabilizes for discharge.   - Stop milrinone today, co-ox in am.     - Hold losartan for now with rise in creatinine.  Will eventually restart.  - Coreg has been decreased to 6.25 mg bid.  - Continue digoxin 0.125, check level Monday.  - Continue hydralazine 25 mg tid and Imdur 30 daily.  - Continue spironolactone 12.5 daily.  K 5 today, can hold po KCl.  - Seen by EP, now set to AAI as BiV pacing appeared to widen QRS.  - I am concerned that he may be nearing end stage HF.  May need to consider LVAD in future.  3. H/o PAF: He is in NSR, on warfarin. Back on amiodarone.  4. H/o PE: Warfarin.  5. 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  6. AKI: Creatinine up to 1.7 with diuresis. As above, holding losartan and torsemide for now.   [name] 02/28/2016 9:58 AM

## 2016-02-28 NOTE — Progress Notes (Signed)
Patient c/o left side chest pain that is sharp intermittently and non radiating. EKG done and MD notified. New orders for pain medication received.

## 2016-02-28 NOTE — Progress Notes (Signed)
ANTICOAGULATION CONSULT NOTE - Follow Up Consult  Pharmacy Consult for Coumadin/Heparin Indication: atrial fibrillation, h/o PE  No Known Allergies  Patient Measurements: Height: 6' (182.9 cm) Weight: 182 lb 12.2 oz (82.9 kg) IBW/kg (Calculated) : 77.6  Vital Signs: Temp: 98.4 F (36.9 C) (07/16 0821) Temp Source: Oral (07/16 0821) BP: 111/67 mmHg (07/16 0821) Pulse Rate: 85 (07/16 0821)  Labs:  Recent Labs  02/26/16 0228 02/26/16 0229 02/26/16 1006 02/27/16 0330 02/28/16 0515  HGB  --   --   --  13.7 14.4  HCT  --   --   --  41.3 43.8  PLT  --   --   --  229 242  LABPROT 21.2*  --   --  23.3* 25.2*  INR 1.84*  --   --  2.08* 2.32*  HEPARINUNFRC  --  0.25* 0.42 0.68  --   CREATININE 1.36*  --   --  1.49* 1.72*    Estimated Creatinine Clearance: 45.1 mL/min (by C-G formula based on Cr of 1.72).   Medications:  Scheduled:  . amiodarone  200 mg Oral BID  . atorvastatin  40 mg Oral Daily  . carvedilol  6.25 mg Oral BID WC  . digoxin  0.125 mg Oral Daily  . hydrALAZINE  25 mg Oral TID  . isosorbide mononitrate  30 mg Oral Daily  . levETIRAcetam  500 mg Oral Daily  . LORazepam  1 mg Oral QHS  . pantoprazole  40 mg Oral Daily  . sodium chloride flush  3 mL Intravenous Q12H  . sodium chloride flush  3 mL Intravenous Q12H  . sodium chloride flush  3 mL Intravenous Q12H  . spironolactone  12.5 mg Oral Daily  . Warfarin - Pharmacist Dosing Inpatient   Does not apply q1800   Infusions:     Assessment: 70 y.o. male with a hx of PE, afib, CVA, SAH, HTN, CHF, and AICD/PPM insertion who presents to the Emergency Department on 02/23/2016 complaining of recurrent, moderate SOB, worse over the past two days.On admission, INR slightly SUBtherapeutic at 1.96. S/p cath 7/13 with clean coronaries and heparin bridge to coumadin resumed. INR up to therapeutic level at 2.08 on 7/15 so hep gtt d/c'd. INR today remains therapeutic at 2.32. CBC remains wnl and stable with no  reported significant s/s bleeding. Will need to follow INR closely since amiodarone initiated this admission which may potentiate the effects of warfarin.   Home Coumadin dose: 5mg  daily  Goal of Therapy:  INR 2-3 Monitor platelets by anticoagulation protocol: Yes   Plan:  Coumadin 5 mg po x 1 tonight  Daily INR Monitor CBC and for s/s bleeding  Breanda Greenlaw K. Bonnye Fava, PharmD, BCPS, CPP Clinical Pharmacist Pager: [phone] Phone: [phone] 02/28/2016 10:06 AM

## 2016-02-29 LAB — URINALYSIS, ROUTINE W REFLEX MICROSCOPIC
Bilirubin Urine: NEGATIVE
Glucose, UA: NEGATIVE mg/dL
Hgb urine dipstick: NEGATIVE
Ketones, ur: NEGATIVE mg/dL
LEUKOCYTES UA: NEGATIVE
NITRITE: NEGATIVE
PROTEIN: 30 mg/dL — AB
SPECIFIC GRAVITY, URINE: 1.014 (ref 1.005–1.030)
pH: 7 (ref 5.0–8.0)

## 2016-02-29 LAB — BASIC METABOLIC PANEL
ANION GAP: 11 (ref 5–15)
BUN: 35 mg/dL — ABNORMAL HIGH (ref 6–20)
CALCIUM: 10.2 mg/dL (ref 8.9–10.3)
CHLORIDE: 90 mmol/L — AB (ref 101–111)
CO2: 28 mmol/L (ref 22–32)
Creatinine, Ser: 1.73 mg/dL — ABNORMAL HIGH (ref 0.61–1.24)
GFR calc non Af Amer: 39 mL/min — ABNORMAL LOW (ref >60)
GFR, EST AFRICAN AMERICAN: 45 mL/min — AB (ref >60)
GLUCOSE: 89 mg/dL (ref 65–99)
Potassium: 4.6 mmol/L (ref 3.5–5.1)
Sodium: 129 mmol/L — ABNORMAL LOW (ref 135–145)

## 2016-02-29 LAB — CBC
HCT: 47.2 % (ref 39.0–52.0)
HEMOGLOBIN: 15.6 g/dL (ref 13.0–17.0)
MCH: 30.7 pg (ref 26.0–34.0)
MCHC: 33.1 g/dL (ref 30.0–36.0)
MCV: 92.9 fL (ref 78.0–100.0)
PLATELETS: 272 10*3/uL (ref 150–400)
RBC: 5.08 MIL/uL (ref 4.22–5.81)
RDW: 13.6 % (ref 11.5–15.5)
WBC: 5.6 10*3/uL (ref 4.0–10.5)

## 2016-02-29 LAB — CARBOXYHEMOGLOBIN
CARBOXYHEMOGLOBIN: 1.8 % — AB (ref 0.5–1.5)
Methemoglobin: 0.7 % (ref 0.0–1.5)
O2 SAT: 61.7 %
Total hemoglobin: 16.1 g/dL (ref 13.5–18.0)

## 2016-02-29 LAB — URINE MICROSCOPIC-ADD ON

## 2016-02-29 LAB — PROTIME-INR
INR: 2.19 — ABNORMAL HIGH (ref 0.00–1.49)
PROTHROMBIN TIME: 24.2 s — AB (ref 11.6–15.2)

## 2016-02-29 MED ORDER — SODIUM CHLORIDE 0.9 % IV SOLN
INTRAVENOUS | Status: AC
  Administered 2016-02-29: 09:00:00 via INTRAVENOUS

## 2016-02-29 MED ORDER — WARFARIN SODIUM 7.5 MG PO TABS
7.5000 mg | ORAL_TABLET | Freq: Once | ORAL | Status: AC
  Administered 2016-02-29: 7.5 mg via ORAL
  Filled 2016-02-29: qty 1

## 2016-02-29 NOTE — Progress Notes (Signed)
Patient ID: Johnathan Morrison, male   DOB: January 31, 1946, 70 y.o.   MRN: [id number]   SUBJECTIVE: Tto po torsemide 02/28/16, but held with creatinine elevated in 1.7 range.  Creatinine remains in 1.7 range this am.   Co-ox 61.7% off milrinone.   CVP 1-2  Denies SOB or CP.  Didn't sleep very well last night. Having abdominal cramping and dysuria.  States he occasionally gets lightheadedness even while lying in bed.   Seen by EP, device settings changed to AAI as BiV pacing appears to be widening his QRS.   LHC/RHC 7/13 Left Main  No significant disease.      Left Anterior Descending  30% proximal LAD stenosis, 20% mid LAD stenosis.     Ramus Intermedius  Moderate, no significant disease.     Left Circumflex  40-50% ostial OM1 stenosis.     Right Coronary Artery  Luminal irregularities.       Right Heart Pressures RHC Procedural Findings: Hemodynamics (mmHg) RA mean 9 RV 39/10 PA 36/12, mean 24 PCWP mean 13 LV 79/18 AO 82/56  Oxygen saturations: PA 54% AO 96%  Cardiac Output (Fick) 3.94  Cardiac Index (Fick) 1.89   Scheduled Meds: . amiodarone  200 mg Oral BID  . atorvastatin  40 mg Oral Daily  . carvedilol  6.25 mg Oral BID WC  . digoxin  0.125 mg Oral Daily  . hydrALAZINE  25 mg Oral TID  . isosorbide mononitrate  30 mg Oral Daily  . levETIRAcetam  500 mg Oral Daily  . LORazepam  1 mg Oral QHS  . pantoprazole  40 mg Oral Daily  . sodium chloride flush  3 mL Intravenous Q12H  . sodium chloride flush  3 mL Intravenous Q12H  . spironolactone  12.5 mg Oral Daily  . Warfarin - Pharmacist Dosing Inpatient   Does not apply q1800   Continuous Infusions:   PRN Meds:.sodium chloride, acetaminophen, albuterol, LORazepam, magnesium hydroxide, ondansetron (ZOFRAN) IV, sodium chloride flush, sodium chloride flush    Filed Vitals:   02/28/16 2118 02/28/16 2329 02/29/16 0300 02/29/16 0421  BP: 99/74 98/74 100/68   Pulse:      Temp:  98.1 F (36.7 C) 97.7 F (36.5  C)   TempSrc:  Oral Oral   Resp: 21 21 17 19   Height:      Weight:   178 lb (80.74 kg)   SpO2:   95%     Intake/Output Summary (Last 24 hours) at 02/29/16 0805 Last data filed at 02/29/16 0000  Gross per 24 hour  Intake  603.2 ml  Output   1875 ml  Net -1271.8 ml    LABS: Basic Metabolic Panel:  Recent Labs  40/98/11 1030 02/29/16 0453  NA 130* 129*  K 5.0 4.6  CL 95* 90*  CO2 28 28  GLUCOSE 141* 89  BUN 28* 35*  CREATININE 1.70* 1.73*  CALCIUM 9.9 10.2   Liver Function Tests: No results for input(s): AST, ALT, ALKPHOS, BILITOT, PROT, ALBUMIN in the last 72 hours. No results for input(s): LIPASE, AMYLASE in the last 72 hours. CBC:  Recent Labs  02/28/16 0515 02/29/16 0453  WBC 4.6 5.6  HGB 14.4 15.6  HCT 43.8 47.2  MCV 93.8 92.9  PLT 242 272   Cardiac Enzymes: No results for input(s): CKTOTAL, CKMB, CKMBINDEX, TROPONINI in the last 72 hours. BNP: Invalid input(s): POCBNP D-Dimer: No results for input(s): DDIMER in the last 72 hours. Hemoglobin A1C: No results for input(s): HGBA1C in the  last 72 hours. Fasting Lipid Panel: No results for input(s): CHOL, HDL, LDLCALC, TRIG, CHOLHDL, LDLDIRECT in the last 72 hours. Thyroid Function Tests: No results for input(s): TSH, T4TOTAL, T3FREE, THYROIDAB in the last 72 hours.  Invalid input(s): FREET3 Anemia Panel: No results for input(s): VITAMINB12, FOLATE, FERRITIN, TIBC, IRON, RETICCTPCT in the last 72 hours.  RADIOLOGY: Dg Chest 2 View  02/23/2016  CLINICAL DATA:  Shortness of breath for 4 days, worsened over the past 2 days. History of congestive heart failure. EXAM: CHEST  2 VIEW COMPARISON:  Single-view of the chest 02/03/2016. CT chest 02/12/2016. FINDINGS: There is cardiomegaly without pulmonary edema. Trace right pleural effusion is seen and there is mild subsegmental atelectasis in the right lung base. Aeration is markedly improved since the prior plain film. AICD is noted. No pneumothorax.  IMPRESSION: Cardiomegaly and pulmonary vascular congestion. Aeration appears markedly improved since the prior plain film. Trace right pleural effusion and subsegmental atelectasis right lung base. Electronically Signed   By: Drusilla Kanner M.D.   On: 02/23/2016 09:04   Ct Angio Chest Pe W/cm &/or Wo Cm  02/12/2016  CLINICAL DATA:  LEFT side chest pain with shortness of breath for 1 day, history hypertension, pacemaker, CHF EXAM: CT ANGIOGRAPHY CHEST WITH CONTRAST TECHNIQUE: Multidetector CT imaging of the chest was performed using the standard protocol during bolus administration of intravenous contrast. Multiplanar CT image reconstructions and MIPs were obtained to evaluate the vascular anatomy. CONTRAST:  100 cc Isovue 370 IV COMPARISON:  None FINDINGS: Cardiovascular: Few scattered atherosclerotic calcifications aorta and coronary arteries. Upper normal caliber ascending thoracic aorta 3.8 cm diameter. Mild limitations of exam secondary to scattered respiratory motion artifacts. Enlarged central pulmonary arteries. Pulmonary arteries well opacified and grossly patent. No definite evidence of pulmonary embolism. Beam hardening artifacts from LEFT subclavian pacemaker leads in RIGHT atrium, RIGHT ventricle, and coronary sinus. Enlargement of cardiac chambers and cardiac size. Mediastinum/Nodes: Esophagus unremarkable. Scattered normal size mediastinal lymph nodes. Lungs/Pleura: Small RIGHT pleural effusion. Scattered respiratory motion artifacts. Minimal compressive atelectasis RIGHT lower lobe. Peripheral interstitial changes at both lung bases. No additional infiltrate, pneumothorax, or LEFT pleural effusion. Upper Abdomen: Distended IVC and hepatic veins with reflux of contrast question elevated RIGHT heart pressure versus sequela power injection. Remaining upper abdomen normal. Musculoskeletal: No acute osseous findings. Review of the MIP images confirms the above findings. IMPRESSION: No gross evidence  of pulmonary embolism identified on exam slightly limited by rash to motion artifacts. Question pulmonary arterial hypertension. Small RIGHT pleural effusion with minimal basilar atelectasis. Enlargement of cardiac chambers with evidence of prior pacemaker placement. Electronically Signed   By: Ulyses Southward M.D.   On: 02/12/2016 14:42   Dg Chest Port 1 View  02/27/2016  CLINICAL DATA:  CHF. EXAM: PORTABLE CHEST 1 VIEW COMPARISON:  02/23/2016. FINDINGS: 1018 hours. The lungs are clear wiithout focal pneumonia, edema, pneumothorax or pleural effusion. The cardio pericardial silhouette is enlarged. Left permanent pacemaker remains in place. The visualized bony structures of the thorax are intact. Telemetry leads overlie the chest. IMPRESSION: Interval improvement in vascular congestion.  No acute findings. Electronically Signed   By: Kennith Center M.D.   On: 02/27/2016 15:09   Dg Chest Port 1 View  02/03/2016  CLINICAL DATA:  70 year old male with shortness of breath EXAM: PORTABLE CHEST 1 VIEW COMPARISON:  None. FINDINGS: Single portable view of the chest demonstrate cardiomegaly with central vascular and interstitial prominence compatible with congestive changes. There bibasilar atelectatic changes of the lungs. There  is no focal consolidation or pneumothorax. No significant pleural effusion. Left pectoral AICD device. No acute osseous pathology. IMPRESSION: Cardiomegaly with congestive changes. Electronically Signed   By: Elgie Collard M.D.   On: 02/03/2016 05:48    PHYSICAL EXAM CVP 1-2 General: NAD Neck: JVP flat, no thyromegaly or thyroid nodule.  Lungs: Clear CV: Lateral PMI.  Heart regular S1/S2, no S3/S4, no murmur.  No peripheral edema.  No carotid bruit.  Normal pedal pulses.  Abdomen: Soft, NT, ND, no HSM. No guarding, no rebound tenderness. No bruits or masses. +BS  Neurologic: Alert and oriented x 3.  Psych: Normal affect. Extremities: No clubbing or cyanosis.   TELEMETRY: Reviewed,  NSR  ASSESSMENT AND PLAN: 70 yo admitted with VT/ICD discharge and acute/chronic systolic CHF. We do not have full records on him as he recently moved to [place].   1. VT: Multiple episodes of VT, shocked on 7/11. May have passed out but he does not remember. Used to be on amiodarone but had been off for a while now and not sure why. Has St Jude CRT-D device.  - Continue amiodarone 200 mg BID.   - Should not drive x 6 months.  2. Acute on chronic systolic CHF: Nonischemic cardiomyopathy. Followed in [place] in the past, we are trying to get records. Mother has CHF. LHC this admission showed no coronary disease.  RHC showed only mildly elevated filling pressures with low cardiac output.  Echo this admission with moderate LV dilation, EF 15-20% with some regionality in wall motion abnormalities. He was started on milrinone given low output.  - CVP 1-2 this am.  Likely over-diuresed.  Will give 23ml/hr of NS x 6 hrs.  - Coox 61% OFF milrinone.    - Continue to hold torsemide for now, will need to restart when creatinine stabilizes for discharge.   - Will leave off milrinone with stable co-ox.      - Hold losartan for now with rise in creatinine.  Will eventually restart.  - Coreg has been decreased to 6.25 mg bid.  - Continue digoxin 0.125. Level 0.4 on 02/28/16 - Continue hydralazine 25 mg tid and Imdur 30 daily.  - Continue spironolactone 12.5 daily.  K 5 today, can hold po KCl.  - Seen by EP, now set to AAI as BiV pacing appeared to widen QRS.  - Concerned that he may be nearing end stage HF.  May need to consider LVAD in future.  3. H/o PAF: He is in NSR, on warfarin. Back on amiodarone.  4. H/o PE: Warfarin.  5. 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  6. AKI: Creatinine up to 1.7 with diuresis. As above, holding losartan and torsemide for now. With gentle IVF planned. 7. Dysuria/Abdominal cramping - Will check UA.   Graciella Freer PA-C 02/29/2016 8:05 AM  Advanced Heart  Failure Team Pager [phone] (M-F; 7a - 4p)  Please contact [facility] for night-coverage after hours (4p -7a ) and weekends on [email or web address]  Patient seen with PA, agree with the above note.  CVP now quite low.  Continue to hold losartan and torsemide.  Will give gentle IV fluid this morning.  Continue other meds.  Reassess in am, possibly home tomorrow.   [name] 02/29/2016 9:40 AM

## 2016-02-29 NOTE — Care Management Important Message (Signed)
Important Message  Patient Details  Name: Johnathan Morrison MRN: [id number] Date of Birth: August 28, 1945   Medicare Important Message Given:  Yes    Bernadette Hoit 02/29/2016, 2:52 PM

## 2016-02-29 NOTE — Progress Notes (Signed)
ANTICOAGULATION CONSULT NOTE - Follow Up Consult  Pharmacy Consult for Coumadin Indication: atrial fibrillation, h/o PE  No Known Allergies  Patient Measurements: Height: 6' (182.9 cm) Weight: 178 lb (80.74 kg) IBW/kg (Calculated) : 77.6  Vital Signs: Temp: 97.9 F (36.6 C) (07/17 1138) Temp Source: Oral (07/17 1138) BP: 98/71 mmHg (07/17 1138)  Labs:  Recent Labs  02/27/16 0330 02/28/16 0515 02/28/16 1030 02/29/16 0453  HGB 13.7 14.4  --  15.6  HCT 41.3 43.8  --  47.2  PLT 229 242  --  272  LABPROT 23.3* 25.2*  --  24.2*  INR 2.08* 2.32*  --  2.19*  HEPARINUNFRC 0.68  --   --   --   CREATININE 1.49* 1.72* 1.70* 1.73*    Estimated Creatinine Clearance: 44.9 mL/min (by C-G formula based on Cr of 1.73).  Assessment: 70yom continues on coumadin for afib and hx PE. INR therapeutic at 2.19. Heparin bridge added after cath on 7/13 and then d/c'ed 7/15 with therapeutic INR. CBC wnl. No bleeding. He continues on amiodarone which is new this admission so will continue to watch for drug interaction.  Home Coumadin dose: 5mg  daily  Goal of Therapy:  INR 2-3 Monitor platelets by anticoagulation protocol: Yes   Plan:  1) Coumadin 7.5 mg po x 1 2) Daily INR  Louie Casa, PharmD, BCPS 02/29/2016 2:04 PM

## 2016-03-01 ENCOUNTER — Encounter: Payer: Medicare Other | Admitting: Cardiovascular Disease

## 2016-03-01 LAB — BASIC METABOLIC PANEL
ANION GAP: 10 (ref 5–15)
BUN: 39 mg/dL — ABNORMAL HIGH (ref 6–20)
CALCIUM: 10.3 mg/dL (ref 8.9–10.3)
CO2: 26 mmol/L (ref 22–32)
Chloride: 93 mmol/L — ABNORMAL LOW (ref 101–111)
Creatinine, Ser: 1.56 mg/dL — ABNORMAL HIGH (ref 0.61–1.24)
GFR, EST AFRICAN AMERICAN: 51 mL/min — AB (ref >60)
GFR, EST NON AFRICAN AMERICAN: 44 mL/min — AB (ref >60)
Glucose, Bld: 98 mg/dL (ref 65–99)
POTASSIUM: 4.2 mmol/L (ref 3.5–5.1)
SODIUM: 129 mmol/L — AB (ref 135–145)

## 2016-03-01 LAB — PROTIME-INR
INR: 2.54 — AB (ref 0.00–1.49)
PROTHROMBIN TIME: 27 s — AB (ref 11.6–15.2)

## 2016-03-01 LAB — TROPONIN I: Troponin I: 0.04 ng/mL (ref <0.03)

## 2016-03-01 LAB — CARBOXYHEMOGLOBIN
CARBOXYHEMOGLOBIN: 1.9 % — AB (ref 0.5–1.5)
Carboxyhemoglobin: 1.3 % (ref 0.5–1.5)
METHEMOGLOBIN: 0.7 % (ref 0.0–1.5)
Methemoglobin: 0.8 % (ref 0.0–1.5)
O2 Saturation: 63.7 %
O2 Saturation: 65.1 %
TOTAL HEMOGLOBIN: 16.5 g/dL (ref 13.5–18.0)
Total hemoglobin: 16 g/dL (ref 13.5–18.0)

## 2016-03-01 MED ORDER — WARFARIN SODIUM 5 MG PO TABS: 5.0000 mg | ORAL_TABLET | Freq: Once | ORAL | Status: DC

## 2016-03-01 MED ORDER — AMIODARONE HCL 200 MG PO TABS: 200.0000 mg | ORAL_TABLET | Freq: Two times a day (BID) | ORAL | Status: DC

## 2016-03-01 MED ORDER — HYDRALAZINE HCL 25 MG PO TABS: 25.0000 mg | ORAL_TABLET | Freq: Three times a day (TID) | ORAL | Status: DC

## 2016-03-01 MED ORDER — SPIRONOLACTONE 25 MG PO TABS: 12.5000 mg | ORAL_TABLET | Freq: Every day | ORAL | Status: DC

## 2016-03-01 MED ORDER — ISOSORBIDE MONONITRATE ER 30 MG PO TB24: 30.0000 mg | ORAL_TABLET | Freq: Every day | ORAL | Status: DC

## 2016-03-01 MED ORDER — CARVEDILOL 6.25 MG PO TABS: 6.2500 mg | ORAL_TABLET | Freq: Two times a day (BID) | ORAL | Status: DC

## 2016-03-01 MED ORDER — NITROGLYCERIN 0.4 MG SL SUBL
SUBLINGUAL_TABLET | SUBLINGUAL | Status: AC
  Administered 2016-03-01: 0.4 mg
  Filled 2016-03-01: qty 1

## 2016-03-01 MED ORDER — FOSFOMYCIN TROMETHAMINE 3 G PO PACK
3.0000 g | PACK | Freq: Once | ORAL | Status: AC
  Administered 2016-03-01: 3 g via ORAL
  Filled 2016-03-01: qty 3

## 2016-03-01 MED ORDER — NITROGLYCERIN 0.4 MG SL SUBL
0.4000 mg | SUBLINGUAL_TABLET | SUBLINGUAL | Status: DC | PRN
  Administered 2016-03-01: 0.4 mg via SUBLINGUAL

## 2016-03-01 MED ORDER — TORSEMIDE 20 MG PO TABS: 40.0000 mg | ORAL_TABLET | Freq: Every day | ORAL | Status: DC

## 2016-03-01 MED ORDER — WARFARIN SODIUM 5 MG PO TABS: 5.0000 mg | ORAL_TABLET | Freq: Every day | ORAL | Status: DC

## 2016-03-01 MED ORDER — POTASSIUM CHLORIDE ER 10 MEQ PO TBCR: 20.0000 meq | EXTENDED_RELEASE_TABLET | Freq: Two times a day (BID) | ORAL | Status: DC

## 2016-03-01 MED ORDER — DIGOXIN 125 MCG PO TABS: 0.1250 mg | ORAL_TABLET | Freq: Every day | ORAL | Status: DC

## 2016-03-01 NOTE — Discharge Summary (Signed)
Advanced Heart Failure Discharge Note   Discharge Summary   Patient ID: Angus Amini MRN: [id number], DOB/AGE: 1945/09/09 70 y.o. Admit date: 02/23/2016 D/C date:     03/01/2016   Primary Discharge Diagnoses:  1. VT: Multiple episodes of VT, shocked on 02/23/16. No driving x 6 months.  2. Acute on chronic systolic CHF: Nonischemic cardiomyopathy. EF 15-20% - Required milrinone this admission but weaned off.  4. H/o PE 5. H/o CVA  6. AKI 7. Dysuria/Abdominal cramping  Hospital Course:  Terence Bart is a 70 y.o. 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 Syncopal episode. Noted to have 30 beats NSVT. ICD interrogation showed 2 shocks. Transferred to [facility] for HF and EP evaluation.   Problem based hospital course as below.   1. VT: Multiple episodes of VT, shocked on 7/11. May have passed out but he does not remember. Used to be on amiodarone but had been off for a while now and not sure why. Has St Jude CRT-D device.  - Loaded on IV amio and transitioned to 400 mg BID. Tapered down to amiodarone 200 mg BID at d/c. Will decrease to daily at followup.  - Should not drive until he has been 6 months without severe arrhythmic event. Pt and daughter both verbalize awareness - EP saw and has follow up arranged.  2. Acute on chronic systolic CHF: Nonischemic cardiomyopathy. Followed in [place] in the past, we are trying to get records. Mother has CHF. LHC this admission showed no coronary disease. RHC showed only mildly elevated filling pressures with low cardiac output.Full results as below. Echo this admission with moderate LV dilation, EF 15-20% with some regionality in wall motion abnormalities. He was started on  milrinone given low output and weaned off as able. Coox 65% OFF milrinone on day of discharge after adequate diuresis.  - He was over diuresed with CVP of ~2 and required several days of held diuretics and gentle IVF rehydration with Creatinine and symptomatic improvement. CVP 4-5 on d/c.  Transitioned from lasix to torsemide, but held for several days leading up to discharge.  - Losartan held with rise in creatinine. Will eventually restart.  - Coreg decreased to 6.25 mg bid with low output.   - Continue digoxin 0.125. Level 0.4 on 02/28/16 - Seen by EP, now set to AAI as BiV pacing appeared to widen QRS.  - Concerned that he may be nearing end stage HF. May need to consider LVAD in future.  3. H/o PAF:  - Remained in NSR, on warfarin.Will resume home warfarin of 5 mg daily and have follow up with coumadin clinic.  - INR 2.54 on day of discharge. Scheduled follow up with Coumadin clinic in [place] per patients request.  - Continue amiodarone  4. H/o PE: Warfarin. Coumadin clinic as above.  5. 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  6. AKI:  - Creatinine improved to 1.5 with gentle IVF.  - Will restart torsemide 03/02/16.  7. Dysuria/Abdominal cramping - UA mildly positive.Treated with one time dose of fosfomycin for empiric UTI coverage. Should follow up with PCP if continues to have symptoms.   Overall out 6.4 L and down 19 lbs from highest weight this admission. Weaned off milrinone prior to d/c. [facility]  and Echo this admission as below.   Had Left sided chest pain prior to d/c. Initial troponin negative and with clean cath as above thought most likely to be non cardiac in origin (of note had just finished breakfast, ? Reflux).   Pt will be discharged to home in stable condition with close follow up in HF clinic, coumadin clinic, and EP clinic as below.   Discharge Weight Range: 175 lbs. Discharge Vitals: Blood pressure 108/70, pulse 85, temperature 97.6 F (36.4 C), temperature  source Oral, resp. rate 24, height 6' (1.829 m), weight 175 lb 11.3 oz (79.7 kg), SpO2 98 %.  Labs: Lab Results  Component Value Date   WBC 5.6 02/29/2016   HGB 15.6 02/29/2016   HCT 47.2 02/29/2016   MCV 92.9 02/29/2016   PLT 272 02/29/2016    Recent Labs Lab 03/01/16 0345  NA 129*  K 4.2  CL 93*  CO2 26  BUN 39*  CREATININE 1.56*  CALCIUM 10.3  GLUCOSE 98   No results found for: CHOL, HDL, LDLCALC, TRIG BNP (last 3 results)  Recent Labs  02/03/16 0521 02/23/16 0836  BNP 1595.0* 2021.0*    ProBNP (last 3 results) No results for input(s): PROBNP in the last 8760 hours.   Diagnostic Studies/Procedures   Echo 02/23/16 LVEF 15-20%, Trivial AI, Mitral MR, Mod/Severe LAE, Mod TR, Mild PR, PA peak pressure 57 mm Hg.  R/LHC 02/25/16 Dominance: Right  Left Main  No significant disease.    Left Anterior Descending  30% proximal LAD stenosis, 20% mid LAD stenosis.    Ramus Intermedius  Moderate, no significant disease.    Left Circumflex  40-50% ostial OM1 stenosis.    Right Coronary Artery  Luminal irregularities.    Right Heart Pressures RHC Procedural Findings: Hemodynamics (mmHg) RA mean 9 RV 39/10 PA 36/12, mean 24 PCWP mean 13 LV 79/18 AO 82/56 Oxygen saturations: PA 54% AO 96% Cardiac Output (Fick) 3.94  Cardiac Index (Fick) 1.89    Discharge Medications     Medication List    STOP taking these medications        aspirin EC 81 MG tablet     furosemide 40 MG tablet  Commonly known as:  LASIX     lisinopril 2.5 MG tablet  Commonly known as:  PRINIVIL,ZESTRIL      TAKE these medications        acetaminophen 500 MG tablet  Commonly known as:  TYLENOL  Take 1,000 mg by mouth every 6 (six) hours as needed.     albuterol 108 (90 Base) MCG/ACT inhaler  Commonly known as:  PROVENTIL HFA;VENTOLIN HFA  Inhale 2 puffs into the lungs every 6 (six) hours as needed for wheezing or shortness of breath.     amiodarone 200 MG tablet    Commonly known as:  PACERONE  Take 1 tablet (200 mg total) by mouth 2 (two) times daily.     atorvastatin 40 MG tablet  Commonly known as:  LIPITOR  Take 1 tablet by mouth daily.     carvedilol 6.25 MG tablet  Commonly known as:  COREG  Take 1 tablet (6.25 mg total) by mouth 2 (two) times daily with a meal.     digoxin 0.125 MG tablet  Commonly known as:  LANOXIN  Take 1 tablet (0.125 mg total) by mouth daily.     esomeprazole 20 MG capsule  Commonly known as:  NEXIUM  Take 20 mg by mouth daily at 12 noon.  hydrALAZINE 25 MG tablet  Commonly known as:  APRESOLINE  Take 1 tablet (25 mg total) by mouth 3 (three) times daily.     isosorbide mononitrate 30 MG 24 hr tablet  Commonly known as:  IMDUR  Take 1 tablet (30 mg total) by mouth daily.     levETIRAcetam 500 MG tablet  Commonly known as:  KEPPRA  Take 500 mg by mouth daily.     LORazepam 0.5 MG tablet  Commonly known as:  ATIVAN  Take 0.5 mg by mouth every 8 (eight) hours as needed for anxiety. Reported on 02/23/2016     nitroGLYCERIN 0.4 MG SL tablet  Commonly known as:  NITROSTAT  Place 1 tablet (0.4 mg total) under the tongue every 5 (five) minutes as needed for chest pain.     potassium chloride 10 MEQ tablet  Commonly known as:  K-DUR  Take 2 tablets (20 mEq total) by mouth 2 (two) times daily.  Start taking on:  03/02/2016     spironolactone 25 MG tablet  Commonly known as:  ALDACTONE  Take 0.5 tablets (12.5 mg total) by mouth daily.     torsemide 20 MG tablet  Commonly known as:  DEMADEX  Take 2 tablets (40 mg total) by mouth daily.  Start taking on:  03/02/2016     warfarin 5 MG tablet  Commonly known as:  COUMADIN  Take 1 tablet (5 mg total) by mouth daily.        Disposition   The patient will be discharged in stable condition to home.     Discharge Instructions    Diet - low sodium heart healthy    Complete by:  As directed      Heart Failure patients record your daily weight using  the same scale at the same time of day    Complete by:  As directed      Increase activity slowly    Complete by:  As directed           Follow-up Information    Follow up with [name], MD On 03/09/2016.   Specialty:  Cardiology   Why:  at 1000 for post hospital follow up. Please bring all of your medications to your visit. The code for parking is 3000.   Contact information:   [address] [place] [address] [phone]       Follow up with [facility] On 03/08/2016.   Specialty:  Cardiology   Why:  at 1000 for Coumadin check.    Contact information:   [address] [place] [address] [phone]      Follow up with Hillis Range, MD On 04/13/2016.   Specialty:  Cardiology   Why:  1:15PM   Contact information:   [address] [place] [address] [phone]         Duration of Discharge Encounter: Greater than 35 minutes   Signed, Graciella Freer PA-C 03/01/2016, 12:03 PM

## 2016-03-01 NOTE — Progress Notes (Signed)
ANTICOAGULATION CONSULT NOTE - Follow Up Consult  Pharmacy Consult for Coumadin Indication: atrial fibrillation, h/o PE  No Known Allergies  Patient Measurements: Height: 6' (182.9 cm) Weight: 175 lb 11.3 oz (79.7 kg) IBW/kg (Calculated) : 77.6  Vital Signs: Temp: 97.6 F (36.4 C) (07/18 0807) Temp Source: Oral (07/18 0807) BP: 115/82 mmHg (07/18 0807) Pulse Rate: 85 (07/18 0807)  Labs:  Recent Labs  02/28/16 0515 02/28/16 1030 02/29/16 0453 03/01/16 0345  HGB 14.4  --  15.6  --   HCT 43.8  --  47.2  --   PLT 242  --  272  --   LABPROT 25.2*  --  24.2* 27.0*  INR 2.32*  --  2.19* 2.54*  CREATININE 1.72* 1.70* 1.73* 1.56*    Estimated Creatinine Clearance: 49.7 mL/min (by C-G formula based on Cr of 1.56).  Assessment: Johnathan Morrison continues on coumadin for afib and hx PE. INR therapeutic at 2.54. Heparin bridge added after cath on 7/13 and then d/c'ed 7/15 with therapeutic INR. CBC wnl. No bleeding. He continues on amiodarone which is new this admission so will continue to watch for drug interaction.  Home Coumadin dose: 5mg  daily  Goal of Therapy:  INR 2-3 Monitor platelets by anticoagulation protocol: Yes   Plan:  1) Coumadin 5 mg po x 1 2) Daily INR  Louie Casa, PharmD, BCPS 03/01/2016 10:28 AM

## 2016-03-01 NOTE — Progress Notes (Signed)
Troponin level-0.04.PA made aware , no order given.

## 2016-03-01 NOTE — Progress Notes (Signed)
Patient ID: Johnathan Morrison, male   DOB: 1946-07-25, 70 y.o.   MRN: [id number]   SUBJECTIVE: Tto po torsemide 02/28/16, but held with creatinine elevated in 1.7 range.  Creatinine down to 1.56 this am.    Co-ox 65% off milrinone.   CVP 4-5  Feels good this morning. Denies SOB or CP.  Abdominal cramping and lightheadedness improved.   Seen by EP, device settings changed to AAI as BiV pacing appears to be widening his QRS.   LHC/RHC 7/13 Left Main  No significant disease.      Left Anterior Descending  30% proximal LAD stenosis, 20% mid LAD stenosis.     Ramus Intermedius  Moderate, no significant disease.     Left Circumflex  40-50% ostial OM1 stenosis.     Right Coronary Artery  Luminal irregularities.       Right Heart Pressures RHC Procedural Findings: Hemodynamics (mmHg) RA mean 9 RV 39/10 PA 36/12, mean 24 PCWP mean 13 LV 79/18 AO 82/56  Oxygen saturations: PA 54% AO 96%  Cardiac Output (Fick) 3.94  Cardiac Index (Fick) 1.89   Scheduled Meds: . amiodarone  200 mg Oral BID  . atorvastatin  40 mg Oral Daily  . carvedilol  6.25 mg Oral BID WC  . digoxin  0.125 mg Oral Daily  . hydrALAZINE  25 mg Oral TID  . isosorbide mononitrate  30 mg Oral Daily  . levETIRAcetam  500 mg Oral Daily  . LORazepam  1 mg Oral QHS  . pantoprazole  40 mg Oral Daily  . sodium chloride flush  3 mL Intravenous Q12H  . sodium chloride flush  3 mL Intravenous Q12H  . spironolactone  12.5 mg Oral Daily  . Warfarin - Pharmacist Dosing Inpatient   Does not apply q1800   Continuous Infusions:   PRN Meds:.sodium chloride, acetaminophen, albuterol, LORazepam, magnesium hydroxide, ondansetron (ZOFRAN) IV, sodium chloride flush, sodium chloride flush    Filed Vitals:   03/01/16 0000 03/01/16 0349 03/01/16 0400 03/01/16 0453  BP:  108/74    Pulse:      Temp:  98.4 F (36.9 C)    TempSrc:  Oral    Resp: 19 22 14    Height:      Weight:    175 lb 11.3 oz (79.7 kg)  SpO2:   97%      Intake/Output Summary (Last 24 hours) at 03/01/16 0754 Last data filed at 03/01/16 0400  Gross per 24 hour  Intake   1340 ml  Output   1375 ml  Net    -35 ml    LABS: Basic Metabolic Panel:  Recent Labs  02/72/53 0453 03/01/16 0345  NA 129* 129*  K 4.6 4.2  CL 90* 93*  CO2 28 26  GLUCOSE 89 98  BUN 35* 39*  CREATININE 1.73* 1.56*  CALCIUM 10.2 10.3   Liver Function Tests: No results for input(s): AST, ALT, ALKPHOS, BILITOT, PROT, ALBUMIN in the last 72 hours. No results for input(s): LIPASE, AMYLASE in the last 72 hours. CBC:  Recent Labs  02/28/16 0515 02/29/16 0453  WBC 4.6 5.6  HGB 14.4 15.6  HCT 43.8 47.2  MCV 93.8 92.9  PLT 242 272   Cardiac Enzymes: No results for input(s): CKTOTAL, CKMB, CKMBINDEX, TROPONINI in the last 72 hours. BNP: Invalid input(s): POCBNP D-Dimer: No results for input(s): DDIMER in the last 72 hours. Hemoglobin A1C: No results for input(s): HGBA1C in the last 72 hours. Fasting Lipid Panel: No results for input(s):  CHOL, HDL, LDLCALC, TRIG, CHOLHDL, LDLDIRECT in the last 72 hours. Thyroid Function Tests: No results for input(s): TSH, T4TOTAL, T3FREE, THYROIDAB in the last 72 hours.  Invalid input(s): FREET3 Anemia Panel: No results for input(s): VITAMINB12, FOLATE, FERRITIN, TIBC, IRON, RETICCTPCT in the last 72 hours.  RADIOLOGY: Dg Chest 2 View  02/23/2016  CLINICAL DATA:  Shortness of breath for 4 days, worsened over the past 2 days. History of congestive heart failure. EXAM: CHEST  2 VIEW COMPARISON:  Single-view of the chest 02/03/2016. CT chest 02/12/2016. FINDINGS: There is cardiomegaly without pulmonary edema. Trace right pleural effusion is seen and there is mild subsegmental atelectasis in the right lung base. Aeration is markedly improved since the prior plain film. AICD is noted. No pneumothorax. IMPRESSION: Cardiomegaly and pulmonary vascular congestion. Aeration appears markedly improved since the prior  plain film. Trace right pleural effusion and subsegmental atelectasis right lung base. Electronically Signed   By: Drusilla Kanner M.D.   On: 02/23/2016 09:04   Ct Angio Chest Pe W/cm &/or Wo Cm  02/12/2016  CLINICAL DATA:  LEFT side chest pain with shortness of breath for 1 day, history hypertension, pacemaker, CHF EXAM: CT ANGIOGRAPHY CHEST WITH CONTRAST TECHNIQUE: Multidetector CT imaging of the chest was performed using the standard protocol during bolus administration of intravenous contrast. Multiplanar CT image reconstructions and MIPs were obtained to evaluate the vascular anatomy. CONTRAST:  100 cc Isovue 370 IV COMPARISON:  None FINDINGS: Cardiovascular: Few scattered atherosclerotic calcifications aorta and coronary arteries. Upper normal caliber ascending thoracic aorta 3.8 cm diameter. Mild limitations of exam secondary to scattered respiratory motion artifacts. Enlarged central pulmonary arteries. Pulmonary arteries well opacified and grossly patent. No definite evidence of pulmonary embolism. Beam hardening artifacts from LEFT subclavian pacemaker leads in RIGHT atrium, RIGHT ventricle, and coronary sinus. Enlargement of cardiac chambers and cardiac size. Mediastinum/Nodes: Esophagus unremarkable. Scattered normal size mediastinal lymph nodes. Lungs/Pleura: Small RIGHT pleural effusion. Scattered respiratory motion artifacts. Minimal compressive atelectasis RIGHT lower lobe. Peripheral interstitial changes at both lung bases. No additional infiltrate, pneumothorax, or LEFT pleural effusion. Upper Abdomen: Distended IVC and hepatic veins with reflux of contrast question elevated RIGHT heart pressure versus sequela power injection. Remaining upper abdomen normal. Musculoskeletal: No acute osseous findings. Review of the MIP images confirms the above findings. IMPRESSION: No gross evidence of pulmonary embolism identified on exam slightly limited by rash to motion artifacts. Question pulmonary  arterial hypertension. Small RIGHT pleural effusion with minimal basilar atelectasis. Enlargement of cardiac chambers with evidence of prior pacemaker placement. Electronically Signed   By: Ulyses Southward M.D.   On: 02/12/2016 14:42   Dg Chest Port 1 View  02/27/2016  CLINICAL DATA:  CHF. EXAM: PORTABLE CHEST 1 VIEW COMPARISON:  02/23/2016. FINDINGS: 1018 hours. The lungs are clear wiithout focal pneumonia, edema, pneumothorax or pleural effusion. The cardio pericardial silhouette is enlarged. Left permanent pacemaker remains in place. The visualized bony structures of the thorax are intact. Telemetry leads overlie the chest. IMPRESSION: Interval improvement in vascular congestion.  No acute findings. Electronically Signed   By: Kennith Center M.D.   On: 02/27/2016 15:09   Dg Chest Port 1 View  02/03/2016  CLINICAL DATA:  70 year old male with shortness of breath EXAM: PORTABLE CHEST 1 VIEW COMPARISON:  None. FINDINGS: Single portable view of the chest demonstrate cardiomegaly with central vascular and interstitial prominence compatible with congestive changes. There bibasilar atelectatic changes of the lungs. There is no focal consolidation or pneumothorax. No significant pleural effusion.  Left pectoral AICD device. No acute osseous pathology. IMPRESSION: Cardiomegaly with congestive changes. Electronically Signed   By: Elgie Collard M.D.   On: 02/03/2016 05:48    PHYSICAL EXAM CVP 4-5 General: NAD Neck: JVP flat, no thyromegaly or thyroid nodule.  Lungs: Clear CV: Lateral PMI.  Heart regular S1/S2, no S3/S4, no murmur.  No peripheral edema.  No carotid bruit.  Normal pedal pulses.  Abdomen: Soft, non-tender, non-distended, no HSM. No guarding, no rebound tenderness. No bruits or masses. +BS  Neurologic: Alert and oriented x 3.  Psych: Normal affect. Extremities: No clubbing or cyanosis.   TELEMETRY: Reviewed, NSR  ASSESSMENT AND PLAN: 70 yo admitted with VT/ICD discharge and acute/chronic  systolic CHF. We do not have full records on him as he recently moved to [place].   1. VT: Multiple episodes of VT, shocked on 7/11. May have passed out but he does not remember. Used to be on amiodarone but had been off for a while now and not sure why. Has St Jude CRT-D device.  - Continue amiodarone 200 mg BID, will decrease to daily at followup.   - Should not drive until he has been 6 months without severe arrhythmic event.  2. Acute on chronic systolic CHF: Nonischemic cardiomyopathy. Followed in [place] in the past, we are trying to get records. Mother has CHF. LHC this admission showed no coronary disease.  RHC showed only mildly elevated filling pressures with low cardiac output.  Echo this admission with moderate LV dilation, EF 15-20% with some regionality in wall motion abnormalities. He was started on milrinone given low output.  - CVP 4-5 with gentle IVF. Will restart torsemide 40 mg daily tomorrow with mildly elevated creatinine still.   - Coox 65% OFF milrinone.    - Hold losartan for now with rise in creatinine.  Will eventually restart. Will attempt in clinic next week.  - Coreg has been decreased to 6.25 mg bid. Will not up-titrate for now with recent low output.  - Continue digoxin 0.125. Level 0.4 on 02/28/16 - Continue hydralazine 25 mg tid and Imdur 30 daily.  - Continue spironolactone 12.5 daily.  K 5 today, can hold po KCl.  - Seen by EP, now set to AAI as BiV pacing appeared to widen QRS.  - Concerned that he may be nearing end stage HF.  May need to consider LVAD in future.  3. H/o PAF: He is in NSR, on warfarin.Will resume home warfarin of 5 mg daily and have follow up with coumadin clinic.  - INR 2.54 - Continue amiodarone   4. H/o PE: Warfarin.  5. 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  6. AKI:  - Creatinine improved to 1.5 with gentle IVF.  - Will restart torsemide TOMORROW.  7. Dysuria/Abdominal cramping - UA mildly positive.  Will treat with one time  dose of fosfomycin.  Should follow up with PCP if continues to have symptoms.   Stable for d/c. Resume torsemide in am.  Will have INR check next week and have close follow up with HF clinic.   Graciella Freer PA-C 03/01/2016 7:54 AM  Advanced Heart Failure Team Pager [phone] (M-F; 7a - 4p)  Please contact [facility] for night-coverage after hours (4p -7a ) and weekends on [email or web address]  Patient seen with PA, agree with the above note.  Creatinine better, good co-ox, CVP remains low.  He is ready for home today.  He will start torsemide 40 mg daily tomorrow.  He will need followup  with device clinic, HF clinic, and coumadin clinic.  This will be arranged.   [name] 03/01/2016 9:02 AM

## 2016-03-01 NOTE — Progress Notes (Addendum)
Paged with 8/10 chest pain.  Started with patient at rest.  Pt at no obvious discomfort.   Describes it as a pressure with no radiation on the Left side of his chest.   Slightly relieved by 1st NTG, requested second.     Has HA from nitro.   Send stat EKG.   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   Likely non-cardiac. Can still go this afternoon as long as he is feeling better.   Johnathan Morrison [address] [place], PA-C 03/01/2016 9:53 AM

## 2016-03-01 NOTE — Progress Notes (Signed)
Complained of pressure-like chest pain scale -8 bp 117/79 hr-70-r-10. Nitro  Sl x2 given with relief. ekg done with no changes, result seen by PA who came to see pt at once. C/o of headache after nitro. given but went away after few min. without intervention. Continue to monitor.

## 2016-03-01 NOTE — Progress Notes (Signed)
Discharged home accompanied by daughter by wheelchair, discharge instructions given to pt. Belongings taken home.Marland Kitchen

## 2016-03-03 ENCOUNTER — Emergency Department (HOSPITAL_COMMUNITY): Payer: Medicare Other

## 2016-03-03 ENCOUNTER — Encounter (HOSPITAL_COMMUNITY): Payer: Self-pay

## 2016-03-03 ENCOUNTER — Emergency Department (HOSPITAL_COMMUNITY)
Admission: EM | Admit: 2016-03-03 | Discharge: 2016-03-03 | Disposition: A | Discharge status: Home / Self Care | Payer: Medicare Other | Attending: Emergency Medicine | Admitting: Emergency Medicine

## 2016-03-03 DIAGNOSIS — J449 Chronic obstructive pulmonary disease, unspecified: Secondary | ICD-10-CM | POA: Insufficient documentation

## 2016-03-03 DIAGNOSIS — I4891 Unspecified atrial fibrillation: Secondary | ICD-10-CM | POA: Insufficient documentation

## 2016-03-03 DIAGNOSIS — N183 Chronic kidney disease, stage 3 (moderate): Secondary | ICD-10-CM | POA: Insufficient documentation

## 2016-03-03 DIAGNOSIS — R112 Nausea with vomiting, unspecified: Secondary | ICD-10-CM | POA: Insufficient documentation

## 2016-03-03 DIAGNOSIS — Z79899 Other long term (current) drug therapy: Secondary | ICD-10-CM | POA: Diagnosis not present

## 2016-03-03 DIAGNOSIS — E860 Dehydration: Secondary | ICD-10-CM | POA: Insufficient documentation

## 2016-03-03 DIAGNOSIS — R0602 Shortness of breath: Secondary | ICD-10-CM | POA: Diagnosis present

## 2016-03-03 DIAGNOSIS — Z7901 Long term (current) use of anticoagulants: Secondary | ICD-10-CM | POA: Diagnosis not present

## 2016-03-03 DIAGNOSIS — I129 Hypertensive chronic kidney disease with stage 1 through stage 4 chronic kidney disease, or unspecified chronic kidney disease: Secondary | ICD-10-CM | POA: Diagnosis not present

## 2016-03-03 LAB — CBC WITH DIFFERENTIAL/PLATELET
BASOS ABS: 0 10*3/uL (ref 0.0–0.1)
Basophils Relative: 1 %
EOS ABS: 0.1 10*3/uL (ref 0.0–0.7)
EOS PCT: 1 %
HEMATOCRIT: 45 % (ref 39.0–52.0)
Hemoglobin: 15 g/dL (ref 13.0–17.0)
LYMPHS ABS: 1.3 10*3/uL (ref 0.7–4.0)
Lymphocytes Relative: 29 %
MCH: 30.6 pg (ref 26.0–34.0)
MCHC: 33.3 g/dL (ref 30.0–36.0)
MCV: 91.8 fL (ref 78.0–100.0)
MONO ABS: 0.6 10*3/uL (ref 0.1–1.0)
Monocytes Relative: 13 %
Neutro Abs: 2.4 10*3/uL (ref 1.7–7.7)
Neutrophils Relative %: 56 %
Platelets: 259 10*3/uL (ref 150–400)
RBC: 4.9 MIL/uL (ref 4.22–5.81)
RDW: 13.8 % (ref 11.5–15.5)
WBC: 4.3 10*3/uL (ref 4.0–10.5)

## 2016-03-03 LAB — COMPREHENSIVE METABOLIC PANEL
ALBUMIN: 4.2 g/dL (ref 3.5–5.0)
ALK PHOS: 77 U/L (ref 38–126)
ALT: 27 U/L (ref 17–63)
AST: 32 U/L (ref 15–41)
Anion gap: 12 (ref 5–15)
BUN: 68 mg/dL — AB (ref 6–20)
CO2: 22 mmol/L (ref 22–32)
CREATININE: 2.68 mg/dL — AB (ref 0.61–1.24)
Calcium: 9.5 mg/dL (ref 8.9–10.3)
Chloride: 94 mmol/L — ABNORMAL LOW (ref 101–111)
GFR calc Af Amer: 26 mL/min — ABNORMAL LOW (ref >60)
GFR calc non Af Amer: 23 mL/min — ABNORMAL LOW (ref >60)
Glucose, Bld: 92 mg/dL (ref 65–99)
Potassium: 4.7 mmol/L (ref 3.5–5.1)
SODIUM: 128 mmol/L — AB (ref 135–145)
TOTAL PROTEIN: 9.1 g/dL — AB (ref 6.5–8.1)
Total Bilirubin: 1.6 mg/dL — ABNORMAL HIGH (ref 0.3–1.2)

## 2016-03-03 LAB — I-STAT CG4 LACTIC ACID, ED: LACTIC ACID, VENOUS: 1.65 mmol/L (ref 0.5–1.9)

## 2016-03-03 LAB — TROPONIN I: Troponin I: 0.03 ng/mL (ref <0.03)

## 2016-03-03 LAB — DIGOXIN LEVEL: DIGOXIN LVL: 1.8 ng/mL (ref 0.8–2.0)

## 2016-03-03 LAB — BRAIN NATRIURETIC PEPTIDE: B Natriuretic Peptide: 115 pg/mL — ABNORMAL HIGH (ref 0.0–100.0)

## 2016-03-03 MED ORDER — ONDANSETRON HCL 4 MG/2ML IJ SOLN
4.0000 mg | Freq: Once | INTRAMUSCULAR | Status: AC
  Administered 2016-03-03: 4 mg via INTRAVENOUS
  Filled 2016-03-03: qty 2

## 2016-03-03 MED ORDER — ALBUTEROL SULFATE (2.5 MG/3ML) 0.083% IN NEBU
5.0000 mg | INHALATION_SOLUTION | Freq: Once | RESPIRATORY_TRACT | Status: AC
  Administered 2016-03-03: 5 mg via RESPIRATORY_TRACT
  Filled 2016-03-03: qty 6

## 2016-03-03 MED ORDER — SODIUM CHLORIDE 0.9 % IV BOLUS (SEPSIS)
250.0000 mL | Freq: Once | INTRAVENOUS | Status: AC
  Administered 2016-03-03: 250 mL via INTRAVENOUS

## 2016-03-03 NOTE — ED Notes (Signed)
Daughter, Johnathan Morrison [phone].

## 2016-03-03 NOTE — Discharge Instructions (Signed)
Our tests indicate that you're dehydrated today. Continue to follow your recommended diet. We are discontinuing 4 of your medications, as indicated on the attached paperwork. To summarize, these are torsemide, digoxin, spironolactone and potassium. Stopping these, should gradually make you feel better. We have scheduled a new appointment for you in the heart failure clinic next Monday, July 24, at 10:30 AM. You can always call them any time if needed, for problems, both day and night. Try to eat 3 meals a day, beginning initially with bland foods. Return here, if needed, for problems.   Dehydration, Adult Dehydration is a condition in which you do not have enough fluid or water in your body. It happens when you take in less fluid than you lose. Vital organs such as the kidneys, brain, and heart cannot function without a proper amount of fluids. Any loss of fluids from the body can cause dehydration.  Dehydration can range from mild to severe. This condition should be treated right away to help prevent it from becoming severe. CAUSES  This condition may be caused by:  Vomiting.  Diarrhea.  Excessive sweating, such as when exercising in hot or humid weather.  Not drinking enough fluid during strenuous exercise or during an illness.  Excessive urine output.  Fever.  Certain medicines. RISK FACTORS This condition is more likely to develop in:  People who are taking certain medicines that cause the body to lose excess fluid (diuretics).   People who have a chronic illness, such as diabetes, that may increase urination.  Older adults.   People who live at high altitudes.   People who participate in endurance sports.  SYMPTOMS  Mild Dehydration  Thirst.  Dry lips.  Slightly dry mouth.  Dry, warm skin. Moderate Dehydration  Very dry mouth.   Muscle cramps.   Dark urine and decreased urine production.   Decreased tear production.   Headache.    Light-headedness, especially when you stand up from a sitting position.  Severe Dehydration  Changes in skin.   Cold and clammy skin.   Skin does not spring back quickly when lightly pinched and released.   Changes in body fluids.   Extreme thirst.   No tears.   Not able to sweat when body temperature is high, such as in hot weather.   Minimal urine production.   Changes in vital signs.   Rapid, weak pulse (more than 100 beats per minute when you are sitting still).   Rapid breathing.   Low blood pressure.   Other changes.   Sunken eyes.   Cold hands and feet.   Confusion.  Lethargy and difficulty being awakened.  Fainting (syncope).   Short-term weight loss.   Unconsciousness. DIAGNOSIS  This condition may be diagnosed based on your symptoms. You may also have tests to determine how severe your dehydration is. These tests may include:   Urine tests.   Blood tests.  TREATMENT  Treatment for this condition depends on the severity. Mild or moderate dehydration can often be treated at home. Treatment should be started right away. Do not wait until dehydration becomes severe. Severe dehydration needs to be treated at the hospital. Treatment for Mild Dehydration  Drinking plenty of water to replace the fluid you have lost.   Replacing minerals in your blood (electrolytes) that you may have lost.  Treatment for Moderate Dehydration  Consuming oral rehydration solution (ORS). Treatment for Severe Dehydration  Receiving fluid through an IV tube.   Receiving electrolyte solution through  a feeding tube that is passed through your nose and into your stomach (nasogastric tube or NG tube).  Correcting any abnormalities in electrolytes. HOME CARE INSTRUCTIONS   Drink enough fluid to keep your urine clear or pale yellow.   Drink water or fluid slowly by taking small sips. You can also try sucking on ice cubes.  Have food or  beverages that contain electrolytes. Examples include bananas and sports drinks.  Take over-the-counter and prescription medicines only as told by your health care provider.   Prepare ORS according to the manufacturer's instructions. Take sips of ORS every 5 minutes until your urine returns to normal.  If you have vomiting or diarrhea, continue to try to drink water, ORS, or both.   If you have diarrhea, avoid:   Beverages that contain caffeine.   Fruit juice.   Milk.   Carbonated soft drinks.  Do not take salt tablets. This can lead to the condition of having too much sodium in your body (hypernatremia).  SEEK MEDICAL CARE IF:  You cannot eat or drink without vomiting.  You have had moderate diarrhea during a period of more than 24 hours.  You have a fever. SEEK IMMEDIATE MEDICAL CARE IF:   You have extreme thirst.  You have severe diarrhea.  You have not urinated in 6-8 hours, or you have urinated only a small amount of very dark urine.  You have shriveled skin.  You are dizzy, confused, or both.   This information is not intended to replace advice given to you by your health care provider. Make sure you discuss any questions you have with your health care provider.   Document Released: 08/01/2005 Document Revised: 04/22/2015 Document Reviewed: 12/17/2014 Elsevier Interactive Patient Education [employer]

## 2016-03-03 NOTE — ED Notes (Signed)
Patient complain of being short of breath, weakness, nausea, vomiting and no appetite. Patient states he was discharged from [facility] Tuesday for same . Patient was given Zofran 4 mg IV prior to arrival.

## 2016-03-03 NOTE — ED Provider Notes (Signed)
CSN: [id number]     Arrival date & time 03/03/16  4098 History  By signing my name below, I, Tanda Rockers, attest that this documentation has been prepared under the direction and in the presence of Mancel Bale, MD. Electronically Signed: Tanda Rockers, ED Scribe. 03/03/2016. 10:30 AM.   Chief Complaint  Patient presents with  . Shortness of Breath   The history is provided by the patient. No language interpreter was used.    HPI Comments: Johnathan Morrison is a 70 y.o. male brought in by ambulance, with PMHx essential hypertension, chronic systolic heart failure, atrial fibrillation, cardiomyopathy, COPD, pulmonary HTN, and chronic renal disease stage III who presents to the Emergency Department complaining of gradual onset, constant, shortness of breath x a couple of days. He also complains of drowsiness, nausea, and vomiting (1 episode per day for the past 2 days). Patient states that sometimes he takes his medicines and they come up, when he vomits. Pt lives alone and called EMS himself. Denies diarrhea, fever, or any other associated symptoms. Patient and daughter state that he is unable to tolerate any medication since being discharged from the hospital. Patient's daughter feels that he was discharged to soon, and did not have at home health services arranged. The daughter also states that she feels that the patient may need placement, because he "lives alone".  Per chart review: Pt was seen in ED on 02/23/2016 (approximately 10 days ago) for shortness of breath. He was also complaining of bilateral foot swelling, appetite loss, confusion, chest tightness, and productive cough with white phlegm. Pt had CBC, BMP, Troponin, BNP, Protime INR, and Digoxin. He also had a CXR with findings of: Cardiomegaly and pulmonary vascular congestion. Aeration appears markedly improved since the prior plain film. Trace right pleural effusion and subsegmental atelectasis right lung base. Pt was admitted for  acute on chronic systolic congestive heart failure and was discharged 2 days ago.   Past Medical History  Diagnosis Date  . Seizures (HCC)   . Essential hypertension   . Chronic systolic heart failure (HCC)   . Atrial fibrillation (HCC)   . Cardiomyopathy (HCC)   . PE (pulmonary embolism) March 2016  . COPD (chronic obstructive pulmonary disease) (HCC)   . Pulmonary HTN (HCC)     Rt heart cath June 2016  . Diastolic dysfunction     Grade 3  . 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 SAH (subarachnoid hemorrhage) (HCC) Dec 2015  . Chronic renal disease, stage III    Past Surgical History  Procedure Laterality Date  . Insertion of icd  09/25/12    Bi V St Jude   . Total knee arthroplasty Bilateral   . Ankle surgery    . Ivc filter placement ([facility] hx)  March 2016  . Ivc filter removal ([facility] hx)  Oct 2016  . Cardiac catheterization N/A 02/25/2016    Procedure: Right/Left Heart Cath and Coronary Angiography;  Surgeon: Laurey Morale, MD;  Location: [facility] INVASIVE CV LAB;  Service: Cardiovascular;  Laterality: N/A;  . Coronary angioplasty    . Coronary angioplasty     Family History  Problem Relation Age of Onset  . Cardiomyopathy    . Sudden death     Social History  Substance Use Topics  . Smoking status: Never Smoker   . Smokeless tobacco: None  . Alcohol Use: No    Review of Systems  Constitutional: Negative for fever.  Respiratory: Positive for shortness of  breath.   Gastrointestinal: Positive for nausea and vomiting. Negative for diarrhea.  All other systems reviewed and are negative.  Allergies  Review of patient's allergies indicates no known allergies.  Home Medications   Prior to Admission medications   Medication Sig Start Date End Date Taking? Authorizing Provider  acetaminophen (TYLENOL) 500 MG tablet Take 1,000 mg by mouth every 6 (six) hours as needed.   Yes Historical Provider, MD  albuterol (PROVENTIL HFA;VENTOLIN HFA) 108 (90 Base) MCG/ACT  inhaler Inhale 2 puffs into the lungs every 6 (six) hours as needed for wheezing or shortness of breath. 02/12/16  Yes [name], MD  amiodarone (PACERONE) 200 MG tablet Take 1 tablet (200 mg total) by mouth 2 (two) times daily. 03/01/16  Yes Graciella Freer, PA-C  carvedilol (COREG) 6.25 MG tablet Take 1 tablet (6.25 mg total) by mouth 2 (two) times daily with a meal. 03/01/16  Yes Graciella Freer, PA-C  digoxin (LANOXIN) 0.125 MG tablet Take 1 tablet (0.125 mg total) by mouth daily. 03/01/16  Yes Graciella Freer, PA-C  hydrALAZINE (APRESOLINE) 25 MG tablet Take 1 tablet (25 mg total) by mouth 3 (three) times daily. 03/01/16  Yes Graciella Freer, PA-C  isosorbide mononitrate (IMDUR) 30 MG 24 hr tablet Take 1 tablet (30 mg total) by mouth daily. 03/01/16  Yes Graciella Freer, PA-C  nitroGLYCERIN (NITROSTAT) 0.4 MG SL tablet Place 1 tablet (0.4 mg total) under the tongue every 5 (five) minutes as needed for chest pain. 02/03/16  Yes Devoria Albe, MD  potassium chloride (K-DUR) 10 MEQ tablet Take 2 tablets (20 mEq total) by mouth 2 (two) times daily. 03/02/16  Yes Graciella Freer, PA-C  spironolactone (ALDACTONE) 25 MG tablet Take 0.5 tablets (12.5 mg total) by mouth daily. 03/01/16  Yes Graciella Freer, PA-C  torsemide (DEMADEX) 20 MG tablet Take 2 tablets (40 mg total) by mouth daily. 03/02/16  Yes Graciella Freer, PA-C  warfarin (COUMADIN) 5 MG tablet Take 1 tablet (5 mg total) by mouth daily. 03/01/16  Yes Graciella Freer, PA-C  atorvastatin (LIPITOR) 40 MG tablet Take 1 tablet by mouth daily. 01/27/16   Historical Provider, MD  esomeprazole (NEXIUM) 20 MG capsule Take 20 mg by mouth daily at 12 noon.    Historical Provider, MD  levETIRAcetam (KEPPRA) 500 MG tablet Take 500 mg by mouth daily.     Historical Provider, MD  LORazepam (ATIVAN) 0.5 MG tablet Take 0.5 mg by mouth every 8 (eight) hours as needed for anxiety. Reported on 02/23/2016     Historical Provider, MD   BP 103/72 mmHg  Pulse 82  Temp(Src) 97.6 F (36.4 C)  Resp 18  Ht 6' (1.829 m)  Wt 178 lb 1 oz (80.769 kg)  BMI 24.14 kg/m2  SpO2 100%   Physical Exam  Constitutional: He is oriented to person, place, and time. He appears well-developed and well-nourished.  HENT:  Head: Normocephalic and atraumatic.  Right Ear: External ear normal.  Left Ear: External ear normal.  Eyes: Conjunctivae and EOM are normal. Pupils are equal, round, and reactive to light.  Neck: Normal range of motion and phonation normal. Neck supple.  Cardiovascular: Normal rate, regular rhythm and normal heart sounds.   No JVD  Pulmonary/Chest: Effort normal. He has no wheezes. He has no rhonchi. He has no rales. He exhibits no bony tenderness.  Mildly diminished air movement bilaterally  Abdominal: Soft. There is no tenderness.  Musculoskeletal: Normal range of motion.  Neurological: He is alert and oriented to person, place, and time. No cranial nerve deficit or sensory deficit. He exhibits normal muscle tone. Coordination normal.  Skin: Skin is warm, dry and intact.  Psychiatric: He has a normal mood and affect. His behavior is normal. Judgment and thought content normal.  Nursing note and vitals reviewed.   ED Course  Procedures (including critical care time)  DIAGNOSTIC STUDIES: Oxygen Saturation is 95% on RA, adequate by my interpretation.    Medications  albuterol (PROVENTIL) (2.5 MG/3ML) 0.083% nebulizer solution 5 mg (5 mg Nebulization Given 03/03/16 1020)  ondansetron (ZOFRAN) injection 4 mg (4 mg Intravenous Given 03/03/16 1042)  ondansetron (ZOFRAN) injection 4 mg (4 mg Intravenous Given 03/03/16 1114)   Patient Vitals for the past 24 hrs:  BP Temp Pulse Resp SpO2 Height Weight  03/03/16 1200 103/72 mmHg - 82 18 100 % - -  03/03/16 1100 93/69 mmHg - 80 22 97 % - -  03/03/16 1043 - - - - - - 178 lb 1 oz (80.769 kg)  03/03/16 1022 - - - - 94 % - -  03/03/16 1005 (!) 83/62  mmHg - 83 21 95 % - -  03/03/16 0943 (!) 86/62 mmHg 97.6 F (36.4 C) 86 22 95 % 6' (1.829 m) 174 lb (78.926 kg)    COORDINATION OF CARE: 10:27 AM-Discussed treatment plan which includes CXR, CMP, CBC, Troponin, and BNP with pt at bedside and pt agreed to plan.   13:00 -Consultation cardiology-discussed with Dr. Diona Browner, here in [place]. He asked that I call the heart failure specialist, in [place] regarding determination for disposition needs.  13:18- case discussed with Dr. Shirlee Latch, his heart failure specialist, regarding his current presentation, and findings. Dr. Shirlee Latch feels that he is over diuresed, and needs to get some IV fluids, for symptomatically relief, as well as discontinue torsemide, digoxin, spironolactone and potassium. He can have the patient be seen in the heart failure clinic office, on 10/09/15. Will give trial of oral fluids and food at this time.  2:34 PM Reevaluation with update and discussion. After initial assessment and treatment, an updated evaluation reveals patient is more comfortable now. He has been able to drink some Sprite, and eat a few crackers. Patient updated on findings. Contacted patient's daughter, Jong Rickman, by phone. Durell Lofaso L   Labs Review Labs Reviewed  COMPREHENSIVE METABOLIC PANEL - Abnormal; Notable for the following:    Sodium 128 (*)    Chloride 94 (*)    BUN 68 (*)    Creatinine, Ser 2.68 (*)    Total Protein 9.1 (*)    Total Bilirubin 1.6 (*)    GFR calc non Af Amer 23 (*)    GFR calc Af Amer 26 (*)    All other components within normal limits  TROPONIN I - Abnormal; Notable for the following:    Troponin I 0.03 (*)    All other components within normal limits  BRAIN NATRIURETIC PEPTIDE - Abnormal; Notable for the following:    B Natriuretic Peptide 115.0 (*)    All other components within normal limits  CULTURE, BLOOD (ROUTINE X 2)  CULTURE, BLOOD (ROUTINE X 2)  CBC WITH DIFFERENTIAL/PLATELET  I-STAT CG4 LACTIC  ACID, ED    Imaging Review Dg Chest Portable 1 View  03/03/2016  CLINICAL DATA:  Shortness of Breath EXAM: PORTABLE CHEST 1 VIEW COMPARISON:  February 27, 2016 FINDINGS: There is patchy infiltrate in the right lower lobe region. Lungs elsewhere clear. There  is moderate cardiomegaly with pulmonary vascularity within normal limits. There is atherosclerotic calcification in the aortic arch. Pacemaker lead tips are attached to the right atrium, right ventricle, and left ventricle, stable. No adenopathy. No bone lesions. IMPRESSION: Patchy infiltrate right base. Lungs elsewhere clear. Stable cardiomegaly. Pacemaker positions unchanged. Aortic atherosclerosis. Followup PA and lateral chest radiographs recommended in 3-4 weeks following trial of antibiotic therapy to ensure resolution and exclude underlying malignancy. Electronically Signed   By: Bretta Bang III M.D.   On: 03/03/2016 10:46   I have personally reviewed and evaluated these images and lab results as part of my medical decision-making.   EKG Interpretation   Date/Time:  Thursday March 03 2016 10:43:57 EDT Ventricular Rate:  80 PR Interval:    QRS Duration: 157 QT Interval:  419 QTC Calculation: 484 R Axis:   -73 Text Interpretation:  Atrial-paced rhythm Left bundle branch block Since  last tracing of earlier today atrial pacing has replaced sinus rhythym  Confirmed by Effie Shy  MD, Benedicta Sultan [phone]) on 03/03/2016 10:52:29 AM        EKG Interpretation  Date/Time:  Thursday March 03 2016 10:43:57 EDT Ventricular Rate:  80 PR Interval:    QRS Duration: 157 QT Interval:  419 QTC Calculation: 484 R Axis:   -73 Text Interpretation:  Atrial-paced rhythm Left bundle branch block Since last tracing of earlier today atrial pacing has replaced sinus rhythym Confirmed by Effie Shy  MD, Keyanni Whittinghill ([id number]) on 03/03/2016 10:52:29 AM        MDM   Final diagnoses:  Dehydration   Nonspecific malaise, with hypotension, and AKI, about recent  baseline. Weight is 1 kg over discharge weight, 2 days ago. Overall the picture is most consistent with intravascular volume depletion, related to aggressive diuresis. Doubt pneumonia since he does not have fever or cough. The patchy infiltrate on chest x-ray is nonspecific and may be related to fluid. Point-of-care lactate is normal, doubt serious bacterial infection.  Nursing Notes Reviewed/ Care Coordinated Applicable Imaging Reviewed Interpretation of Laboratory Data incorporated into ED treatment  The patient appears reasonably screened and/or stabilized for discharge and I doubt any other medical condition or other [facility] requiring further screening, evaluation, or treatment in the ED at this time prior to discharge.  Plan: Home Medications- medications to hold for now- torsemide, digoxin, spironolactone and potassium; Home Treatments- continue fluid restriction; return here if the recommended treatment, does not improve the symptoms; Recommended follow up- follow up at Heart Failure clinic on 03/07/2016, at 10:30 AM.   I personally performed the services described in this documentation, which was scribed in my presence. The recorded information has been reviewed and is accurate.      Mancel Bale, MD 03/03/16 [phone]

## 2016-03-03 NOTE — ED Notes (Signed)
Pt tolerating po fluids and saline crackers.

## 2016-03-05 ENCOUNTER — Inpatient Hospital Stay (HOSPITAL_COMMUNITY)
Admission: EM | Admit: 2016-03-05 | Discharge: 2016-03-10 | DRG: 286 | Disposition: A | Discharge status: Skilled Nursing Facility | Payer: Medicare Other | Attending: Internal Medicine | Admitting: Internal Medicine

## 2016-03-05 ENCOUNTER — Emergency Department (HOSPITAL_COMMUNITY): Payer: Medicare Other

## 2016-03-05 ENCOUNTER — Encounter (HOSPITAL_COMMUNITY): Payer: Self-pay | Admitting: Emergency Medicine

## 2016-03-05 DIAGNOSIS — R634 Abnormal weight loss: Secondary | ICD-10-CM

## 2016-03-05 DIAGNOSIS — Z95810 Presence of automatic (implantable) cardiac defibrillator: Secondary | ICD-10-CM

## 2016-03-05 DIAGNOSIS — Z86711 Personal history of pulmonary embolism: Secondary | ICD-10-CM

## 2016-03-05 DIAGNOSIS — R079 Chest pain, unspecified: Secondary | ICD-10-CM

## 2016-03-05 DIAGNOSIS — Z96653 Presence of artificial knee joint, bilateral: Secondary | ICD-10-CM | POA: Diagnosis present

## 2016-03-05 DIAGNOSIS — E785 Hyperlipidemia, unspecified: Secondary | ICD-10-CM | POA: Diagnosis present

## 2016-03-05 DIAGNOSIS — Z7189 Other specified counseling: Secondary | ICD-10-CM

## 2016-03-05 DIAGNOSIS — E0590 Thyrotoxicosis, unspecified without thyrotoxic crisis or storm: Secondary | ICD-10-CM

## 2016-03-05 DIAGNOSIS — N183 Chronic kidney disease, stage 3 unspecified: Secondary | ICD-10-CM | POA: Diagnosis present

## 2016-03-05 DIAGNOSIS — E43 Unspecified severe protein-calorie malnutrition: Secondary | ICD-10-CM | POA: Insufficient documentation

## 2016-03-05 DIAGNOSIS — I429 Cardiomyopathy, unspecified: Secondary | ICD-10-CM | POA: Diagnosis present

## 2016-03-05 DIAGNOSIS — I69354 Hemiplegia and hemiparesis following cerebral infarction affecting left non-dominant side: Secondary | ICD-10-CM

## 2016-03-05 DIAGNOSIS — Z7901 Long term (current) use of anticoagulants: Secondary | ICD-10-CM

## 2016-03-05 DIAGNOSIS — Z515 Encounter for palliative care: Secondary | ICD-10-CM

## 2016-03-05 DIAGNOSIS — F411 Generalized anxiety disorder: Secondary | ICD-10-CM | POA: Diagnosis present

## 2016-03-05 DIAGNOSIS — I5023 Acute on chronic systolic (congestive) heart failure: Secondary | ICD-10-CM | POA: Diagnosis not present

## 2016-03-05 DIAGNOSIS — E1122 Type 2 diabetes mellitus with diabetic chronic kidney disease: Secondary | ICD-10-CM | POA: Diagnosis present

## 2016-03-05 DIAGNOSIS — I472 Ventricular tachycardia: Secondary | ICD-10-CM

## 2016-03-05 DIAGNOSIS — Z79899 Other long term (current) drug therapy: Secondary | ICD-10-CM

## 2016-03-05 DIAGNOSIS — I4729 Other ventricular tachycardia: Secondary | ICD-10-CM

## 2016-03-05 DIAGNOSIS — K5900 Constipation, unspecified: Secondary | ICD-10-CM | POA: Diagnosis present

## 2016-03-05 DIAGNOSIS — Z66 Do not resuscitate: Secondary | ICD-10-CM | POA: Diagnosis present

## 2016-03-05 DIAGNOSIS — G40909 Epilepsy, unspecified, not intractable, without status epilepticus: Secondary | ICD-10-CM | POA: Diagnosis present

## 2016-03-05 DIAGNOSIS — I272 Other secondary pulmonary hypertension: Secondary | ICD-10-CM | POA: Diagnosis present

## 2016-03-05 DIAGNOSIS — N179 Acute kidney failure, unspecified: Secondary | ICD-10-CM | POA: Diagnosis present

## 2016-03-05 DIAGNOSIS — Z6823 Body mass index (BMI) 23.0-23.9, adult: Secondary | ICD-10-CM

## 2016-03-05 DIAGNOSIS — E0520 Thyrotoxicosis with toxic multinodular goiter without thyrotoxic crisis or storm: Secondary | ICD-10-CM | POA: Diagnosis present

## 2016-03-05 DIAGNOSIS — I130 Hypertensive heart and chronic kidney disease with heart failure and stage 1 through stage 4 chronic kidney disease, or unspecified chronic kidney disease: Principal | ICD-10-CM | POA: Diagnosis present

## 2016-03-05 DIAGNOSIS — K219 Gastro-esophageal reflux disease without esophagitis: Secondary | ICD-10-CM | POA: Diagnosis present

## 2016-03-05 DIAGNOSIS — I10 Essential (primary) hypertension: Secondary | ICD-10-CM | POA: Diagnosis present

## 2016-03-05 DIAGNOSIS — J449 Chronic obstructive pulmonary disease, unspecified: Secondary | ICD-10-CM | POA: Diagnosis present

## 2016-03-05 DIAGNOSIS — I4891 Unspecified atrial fibrillation: Secondary | ICD-10-CM | POA: Diagnosis not present

## 2016-03-05 DIAGNOSIS — I447 Left bundle-branch block, unspecified: Secondary | ICD-10-CM | POA: Diagnosis present

## 2016-03-05 DIAGNOSIS — Z87891 Personal history of nicotine dependence: Secondary | ICD-10-CM

## 2016-03-05 DIAGNOSIS — Z87898 Personal history of other specified conditions: Secondary | ICD-10-CM

## 2016-03-05 DIAGNOSIS — K029 Dental caries, unspecified: Secondary | ICD-10-CM

## 2016-03-05 LAB — BASIC METABOLIC PANEL
ANION GAP: 8 (ref 5–15)
BUN: 36 mg/dL — ABNORMAL HIGH (ref 6–20)
CHLORIDE: 98 mmol/L — AB (ref 101–111)
CO2: 24 mmol/L (ref 22–32)
Calcium: 10 mg/dL (ref 8.9–10.3)
Creatinine, Ser: 1.4 mg/dL — ABNORMAL HIGH (ref 0.61–1.24)
GFR calc non Af Amer: 50 mL/min — ABNORMAL LOW (ref >60)
GFR, EST AFRICAN AMERICAN: 58 mL/min — AB (ref >60)
Glucose, Bld: 104 mg/dL — ABNORMAL HIGH (ref 65–99)
Potassium: 5.1 mmol/L (ref 3.5–5.1)
Sodium: 130 mmol/L — ABNORMAL LOW (ref 135–145)

## 2016-03-05 LAB — I-STAT CHEM 8, ED
BUN: 40 mg/dL — ABNORMAL HIGH (ref 6–20)
CALCIUM ION: 1.21 mmol/L (ref 1.12–1.23)
CHLORIDE: 97 mmol/L — AB (ref 101–111)
Creatinine, Ser: 1.4 mg/dL — ABNORMAL HIGH (ref 0.61–1.24)
Glucose, Bld: 103 mg/dL — ABNORMAL HIGH (ref 65–99)
HCT: 48 % (ref 39.0–52.0)
Hemoglobin: 16.3 g/dL (ref 13.0–17.0)
POTASSIUM: 5.3 mmol/L — AB (ref 3.5–5.1)
SODIUM: 133 mmol/L — AB (ref 135–145)
TCO2: 25 mmol/L (ref 0–100)

## 2016-03-05 LAB — CBC
HCT: 44.7 % (ref 39.0–52.0)
HEMOGLOBIN: 14.9 g/dL (ref 13.0–17.0)
MCH: 30.7 pg (ref 26.0–34.0)
MCHC: 33.3 g/dL (ref 30.0–36.0)
MCV: 92.2 fL (ref 78.0–100.0)
Platelets: 279 10*3/uL (ref 150–400)
RBC: 4.85 MIL/uL (ref 4.22–5.81)
RDW: 13 % (ref 11.5–15.5)
WBC: 3.8 10*3/uL — ABNORMAL LOW (ref 4.0–10.5)

## 2016-03-05 LAB — I-STAT TROPONIN, ED: Troponin i, poc: 0.04 ng/mL (ref 0.00–0.08)

## 2016-03-05 LAB — PROTIME-INR
INR: 3.74 — AB (ref 0.00–1.49)
Prothrombin Time: 36.1 seconds — ABNORMAL HIGH (ref 11.6–15.2)

## 2016-03-05 LAB — BRAIN NATRIURETIC PEPTIDE: B NATRIURETIC PEPTIDE 5: 759.5 pg/mL — AB (ref 0.0–100.0)

## 2016-03-05 MED ORDER — LORAZEPAM 0.5 MG PO TABS: 0.5000 mg | ORAL_TABLET | Freq: Three times a day (TID) | ORAL | Status: DC | PRN

## 2016-03-05 MED ORDER — AMIODARONE HCL 200 MG PO TABS
200.0000 mg | ORAL_TABLET | Freq: Two times a day (BID) | ORAL | Status: DC
  Administered 2016-03-06 (×3): 200 mg via ORAL
  Filled 2016-03-05 (×3): qty 1

## 2016-03-05 MED ORDER — SPIRONOLACTONE 25 MG PO TABS
12.5000 mg | ORAL_TABLET | Freq: Every day | ORAL | Status: DC
  Administered 2016-03-06 – 2016-03-10 (×5): 12.5 mg via ORAL
  Filled 2016-03-05 (×5): qty 1

## 2016-03-05 MED ORDER — WARFARIN - PHARMACIST DOSING INPATIENT
Freq: Every day | Status: DC
  Administered 2016-03-08: 18:00:00

## 2016-03-05 MED ORDER — HYDRALAZINE HCL 25 MG PO TABS
25.0000 mg | ORAL_TABLET | Freq: Three times a day (TID) | ORAL | Status: DC
  Administered 2016-03-06 – 2016-03-10 (×13): 25 mg via ORAL
  Filled 2016-03-05 (×14): qty 1

## 2016-03-05 MED ORDER — CARVEDILOL 6.25 MG PO TABS
6.2500 mg | ORAL_TABLET | Freq: Two times a day (BID) | ORAL | Status: DC
  Administered 2016-03-06 – 2016-03-10 (×8): 6.25 mg via ORAL
  Filled 2016-03-05 (×10): qty 1

## 2016-03-05 MED ORDER — LEVETIRACETAM 500 MG PO TABS
500.0000 mg | ORAL_TABLET | Freq: Every day | ORAL | Status: DC
  Administered 2016-03-06 – 2016-03-10 (×5): 500 mg via ORAL
  Filled 2016-03-05 (×5): qty 1

## 2016-03-05 MED ORDER — DIGOXIN 125 MCG PO TABS: 125.0000 ug | ORAL_TABLET | Freq: Every day | ORAL | Status: DC

## 2016-03-05 MED ORDER — IPRATROPIUM BROMIDE 0.02 % IN SOLN: 0.5000 mg | RESPIRATORY_TRACT | Status: DC | PRN

## 2016-03-05 MED ORDER — ATORVASTATIN CALCIUM 40 MG PO TABS
40.0000 mg | ORAL_TABLET | Freq: Every day | ORAL | Status: DC
Start: 2016-03-06 — End: 2016-03-10
  Administered 2016-03-06 – 2016-03-10 (×5): 40 mg via ORAL
  Filled 2016-03-05 (×5): qty 1

## 2016-03-05 MED ORDER — ONDANSETRON HCL 4 MG/2ML IJ SOLN: 4.0000 mg | Freq: Four times a day (QID) | INTRAMUSCULAR | Status: DC | PRN

## 2016-03-05 MED ORDER — PANTOPRAZOLE SODIUM 40 MG PO TBEC
40.0000 mg | DELAYED_RELEASE_TABLET | Freq: Every day | ORAL | Status: DC
  Administered 2016-03-06 – 2016-03-10 (×5): 40 mg via ORAL
  Filled 2016-03-05 (×5): qty 1

## 2016-03-05 MED ORDER — ONDANSETRON HCL 4 MG PO TABS: 4.0000 mg | ORAL_TABLET | Freq: Four times a day (QID) | ORAL | Status: DC | PRN

## 2016-03-05 MED ORDER — ISOSORBIDE MONONITRATE ER 30 MG PO TB24
30.0000 mg | ORAL_TABLET | Freq: Every day | ORAL | Status: DC
  Administered 2016-03-06 – 2016-03-10 (×4): 30 mg via ORAL
  Filled 2016-03-05 (×5): qty 1

## 2016-03-05 MED ORDER — LISINOPRIL 2.5 MG PO TABS
2.5000 mg | ORAL_TABLET | Freq: Two times a day (BID) | ORAL | Status: DC
  Administered 2016-03-06 – 2016-03-07 (×5): 2.5 mg via ORAL
  Filled 2016-03-05 (×6): qty 1

## 2016-03-05 MED ORDER — ALBUTEROL SULFATE (2.5 MG/3ML) 0.083% IN NEBU: 2.5000 mg | INHALATION_SOLUTION | RESPIRATORY_TRACT | Status: DC | PRN

## 2016-03-05 MED ORDER — TORSEMIDE 20 MG PO TABS
10.0000 mg | ORAL_TABLET | Freq: Every day | ORAL | Status: DC
  Administered 2016-03-06 – 2016-03-10 (×5): 10 mg via ORAL
  Filled 2016-03-05 (×5): qty 1

## 2016-03-05 NOTE — H&P (Signed)
History and Physical    Johnathan Morrison WUJ:[id number] DOB: 1946-03-29 DOA: 03/05/2016  Referring MD/NP/PA: Dr. Jodi Mourning PCP: [facility]  Patient coming from: Home  Chief Complaint: chest pain  HPI: Johnathan Morrison is a 70 y.o. 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 He makes note that he previously was on oxygen and this was taken away, but he feels that he needs it at home again. Patient notes that since his last hospitalization that there were over 8 medication changes that were made. He feels uncomfortable although he is been taking medications as advised. He notes that he went from around 190 pounds down 174 and he thinks that that is too much weight to lose. Associated symptoms include decreased appetite due to what the patient reports as "everything tastes nasty", nausea, dizziness. He denies having any loss of consciousness, vomiting, diarrhea, blood in stool/urine, lower extremity swelling, chest pain, or shortness of breath since oxygen has been placed. Review of records shows the patient which was last admitted into the hospital at [facility] transferred down to [facility] from 7/11-7/18 for complaints of shortness of breath. Patient was found to have multiple episodes of VT which he was shocked on 7/11. Seen by EP and started back on amiodarone, underwent cardiac cath 7/13 by Dr. Freida Busman which showed nonobstructive cardiomyopathy with EF of 15-20%, required milrinone initially for low output heart failure(but was able to be weaned off prior to discharge), and was discharged in fairly stable condition. Patient was seen in the ED 2 days ago with similar complaints of shortness of breath. Family was noted as thinking that the patient was discharged to early and possibly need placement as he currently lives  alone and has not been doing well since being discharged home. Currently denies having any   ED Course: Patient was initially called in by EMS as a code stemi, but this was cancelled was evaluated by cardiology and it was recommended no changes be made at this time. Vital signs otherwise stable.  Review of Systems: As per HPI otherwise 10 point review of systems negative.   Past Medical History  Diagnosis Date  . Seizures (HCC)   . Essential hypertension   . Chronic systolic heart failure (HCC)   . Atrial fibrillation (HCC)   . Cardiomyopathy (HCC)   . PE (pulmonary embolism) March 2016  . COPD (chronic obstructive pulmonary disease) (HCC)   . Pulmonary HTN (HCC)     Rt heart cath June 2016  . Diastolic dysfunction     Grade 3  . 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 SAH (subarachnoid hemorrhage) (HCC) Dec 2015  . Chronic renal disease, stage III     Past Surgical History  Procedure Laterality Date  . Insertion of icd  09/25/12    Bi V [employer]   . Total knee arthroplasty Bilateral   . Ankle surgery    . Ivc filter placement ([facility] hx)  March 2016  . Ivc filter removal ([facility] hx)  Oct 2016  . Cardiac catheterization N/A 02/25/2016    Procedure: Right/Left Heart Cath and Coronary Angiography;  Surgeon: Laurey Morale, MD;  Location: [facility] INVASIVE CV LAB;  Service: Cardiovascular;  Laterality: N/A;  . Coronary angioplasty    . Coronary angioplasty       reports that he  has never smoked. He does not have any smokeless tobacco history on file. He reports that he does not drink alcohol or use illicit drugs.  No Known Allergies  Family History  Problem Relation Age of Onset  . Cardiomyopathy    . Sudden death      Prior to Admission medications   Medication Sig Start Date End Date Taking? Authorizing Provider  acetaminophen (TYLENOL) 500 MG tablet Take 1,000 mg by mouth 2 (two) times daily as needed for mild pain or headache.    Yes Historical Provider, MD    acetaminophen-codeine (TYLENOL #3) 300-30 MG tablet Take 1 tablet by mouth daily as needed for moderate pain.   Yes Historical Provider, MD  albuterol (PROVENTIL HFA;VENTOLIN HFA) 108 (90 Base) MCG/ACT inhaler Inhale 2 puffs into the lungs every 6 (six) hours as needed for wheezing or shortness of breath. 02/12/16  Yes [name], MD  amiodarone (PACERONE) 200 MG tablet Take 1 tablet (200 mg total) by mouth 2 (two) times daily. 03/01/16  Yes Graciella Freer, PA-C  atorvastatin (LIPITOR) 40 MG tablet Take 40 mg by mouth every morning.  01/27/16  Yes Historical Provider, MD  carvedilol (COREG) 6.25 MG tablet Take 1 tablet (6.25 mg total) by mouth 2 (two) times daily with a meal. 03/01/16  Yes Graciella Freer, PA-C  digoxin (LANOXIN) 0.125 MG tablet Take 125 mcg by mouth daily. 03/01/16  Yes Historical Provider, MD  esomeprazole (NEXIUM) 20 MG capsule Take 20 mg by mouth daily at 12 noon.   Yes Historical Provider, MD  hydrALAZINE (APRESOLINE) 25 MG tablet Take 1 tablet (25 mg total) by mouth 3 (three) times daily. Patient taking differently: Take 25 mg by mouth every 8 (eight) hours.  03/01/16  Yes Graciella Freer, PA-C  isosorbide mononitrate (IMDUR) 30 MG 24 hr tablet Take 1 tablet (30 mg total) by mouth daily. 03/01/16  Yes Graciella Freer, PA-C  levETIRAcetam (KEPPRA) 500 MG tablet Take 500 mg by mouth daily.    Yes Historical Provider, MD  lisinopril (PRINIVIL,ZESTRIL) 2.5 MG tablet Take 2.5 mg by mouth 2 (two) times daily.  12/20/15  Yes Historical Provider, MD  LORazepam (ATIVAN) 0.5 MG tablet Take 0.5 mg by mouth every 8 (eight) hours as needed for anxiety. Reported on 02/23/2016   Yes Historical Provider, MD  nitroGLYCERIN (NITROSTAT) 0.4 MG SL tablet Place 1 tablet (0.4 mg total) under the tongue every 5 (five) minutes as needed for chest pain. 02/03/16  Yes Devoria Albe, MD  potassium chloride (K-DUR) 10 MEQ tablet Take 20 mEq by mouth 2 (two) times daily. 03/01/16  Yes  Historical Provider, MD  spironolactone (ALDACTONE) 25 MG tablet Take 12.5 mg by mouth daily.   Yes Historical Provider, MD  torsemide (DEMADEX) 20 MG tablet Take 10 mg by mouth daily. 03/01/16  Yes Historical Provider, MD  warfarin (COUMADIN) 5 MG tablet Take 1 tablet (5 mg total) by mouth daily. Patient taking differently: Take 5 mg by mouth every morning.  03/01/16  Yes Graciella Freer, PA-C    Physical Exam:   Constitutional:+Elderly male NAD, calm, comfortable Filed Vitals:   03/05/16 2015 03/05/16 2030 03/05/16 2045 03/05/16 2100  BP: 102/89 101/79 118/87 108/88  Pulse: 84 80 80 79  Temp:      TempSrc:      Resp: 15 28 21 28   Height:      Weight:      SpO2: 96% 98% 99% 98%   Eyes: PERRL, lids  and conjunctivae normal ENMT: Mucous membranes are moist. Posterior pharynx clear of any exudate or lesions.Normal dentition.  Neck: normal, supple, no masses, no thyromegaly Respiratory: Decreased aeration, no wheezing, no crackles. Mild increased work of breathing. No accessory muscle use.  Cardiovascular: Regular rate and rhythm, no murmurs / rubs / gallops. No extremity edema. 2+ pedal pulses. No carotid bruits.  Abdomen: no tenderness, no masses palpated. No hepatosplenomegaly. Bowel sounds positive.  Musculoskeletal: no clubbing / cyanosis. No joint deformity upper and lower extremities. Good ROM, no contractures. Normal muscle tone.  Skin: no rashes, lesions, ulcers. No induration Neurologic: CN 2-12 grossly intact. Sensation intact, DTR normal. Strength 5/5 in all 4.  Psychiatric: Normal judgment and insight. Alert and oriented x 3. Anxiety mood.     Labs on Admission: I have personally reviewed following labs and imaging studies  CBC:  Recent Labs Lab 02/28/16 0515 02/29/16 0453 03/03/16 1030 03/05/16 1942 03/05/16 1947  WBC 4.6 5.6 4.3 3.8*  --   NEUTROABS  --   --  2.4  --   --   HGB 14.4 15.6 15.0 14.9 16.3  HCT 43.8 47.2 45.0 44.7 48.0  MCV 93.8 92.9 91.8  92.2  --   PLT 242 272 259 279  --    Basic Metabolic Panel:  Recent Labs Lab 02/28/16 1030 02/29/16 0453 03/01/16 0345 03/03/16 1030 03/05/16 1942 03/05/16 1947  NA 130* 129* 129* 128* 130* 133*  K 5.0 4.6 4.2 4.7 5.1 5.3*  CL 95* 90* 93* 94* 98* 97*  CO2 28 28 26 22 24   --   GLUCOSE 141* 89 98 92 104* 103*  BUN 28* 35* 39* 68* 36* 40*  CREATININE 1.70* 1.73* 1.56* 2.68* 1.40* 1.40*  CALCIUM 9.9 10.2 10.3 9.5 10.0  --    GFR: Estimated Creatinine Clearance: 55.4 mL/min (by C-G formula based on Cr of 1.4). Liver Function Tests:  Recent Labs Lab 03/03/16 1030  AST 32  ALT 27  ALKPHOS 77  BILITOT 1.6*  PROT 9.1*  ALBUMIN 4.2   No results for input(s): LIPASE, AMYLASE in the last 168 hours. No results for input(s): AMMONIA in the last 168 hours. Coagulation Profile:  Recent Labs Lab 02/28/16 0515 02/29/16 0453 03/01/16 0345 03/05/16 1942  INR 2.32* 2.19* 2.54* 3.74*   Cardiac Enzymes:  Recent Labs Lab 03/01/16 1010 03/03/16 1030  TROPONINI 0.04* 0.03*   BNP (last 3 results) No results for input(s): PROBNP in the last 8760 hours. HbA1C: No results for input(s): HGBA1C in the last 72 hours. CBG: No results for input(s): GLUCAP in the last 168 hours. Lipid Profile: No results for input(s): CHOL, HDL, LDLCALC, TRIG, CHOLHDL, LDLDIRECT in the last 72 hours. Thyroid Function Tests: No results for input(s): TSH, T4TOTAL, FREET4, T3FREE, THYROIDAB in the last 72 hours. Anemia Panel: No results for input(s): VITAMINB12, FOLATE, FERRITIN, TIBC, IRON, RETICCTPCT in the last 72 hours. Urine analysis:    Component Value Date/Time   COLORURINE YELLOW 02/29/2016 0914   APPEARANCEUR CLEAR 02/29/2016 0914   LABSPEC 1.014 02/29/2016 0914   PHURINE 7.0 02/29/2016 0914   GLUCOSEU NEGATIVE 02/29/2016 0914   HGBUR NEGATIVE 02/29/2016 0914   BILIRUBINUR NEGATIVE 02/29/2016 0914   KETONESUR NEGATIVE 02/29/2016 0914   PROTEINUR 30* 02/29/2016 0914   NITRITE  NEGATIVE 02/29/2016 0914   LEUKOCYTESUR NEGATIVE 02/29/2016 0914   Sepsis Labs: Recent Results (from the past 240 hour(s))  Culture, blood (routine x 2)     Status: None (Preliminary result)  Collection Time: 03/03/16 12:15 PM  Result Value Ref Range Status   Specimen Description BLOOD RIGHT ANTECUBITAL  Final   Special Requests BOTTLES DRAWN AEROBIC AND ANAEROBIC 5CC EACH  Final   Culture NO GROWTH 2 DAYS  Final   Report Status PENDING  Incomplete  Culture, blood (routine x 2)     Status: None (Preliminary result)   Collection Time: 03/03/16 12:25 PM  Result Value Ref Range Status   Specimen Description BLOOD RIGHT FOREARM  Final   Special Requests BOTTLES DRAWN AEROBIC AND ANAEROBIC 5CC EACH  Final   Culture NO GROWTH 2 DAYS  Final   Report Status PENDING  Incomplete     Radiological Exams on Admission: Dg Chest Portable 1 View  03/05/2016  CLINICAL DATA:  Chest pain x2 days EXAM: PORTABLE CHEST 1 VIEW COMPARISON:  03/03/2016 FINDINGS: Increased interstitial markings, chronic. No frank interstitial edema. No pleural effusion or pneumothorax. Cardiomegaly.  Left subclavian pacemaker. IMPRESSION: No evidence of acute cardiopulmonary disease. Electronically Signed   By: Charline Bills M.D.   On: 03/05/2016 19:55    EKG: Independently reviewed. Similar to previous tracings.  Assessment/Plan Chest pain: Acute. Patient presents with complaints of chest pain and shortness of breath. EKG and lab work  - admit to a Telemetry bed - Trend cardiac troponins - Appreciate Cardiology consultation  Shortness of breath/ COPD without acute exacerbation: Acute on chronic. CXR shows no gross signs of fluid overload. - Continuous pulse oximetry with nasal cannula oxygen to keep O2 sats greater than 92% - DuoNeb's every 2 hours as needed shortness of breath or wheezing  Question hyperthyroidism: Previous TSH noted to be 0.138 on 7/11 prior to cardiac cath procedure. - Checking Free T4  - Will  need to follow-up with cardiology in regards to patient's amiodarone as this medication has been known to cause hyper/hypothyroidism Questioned if this is further worsening respiratory function as   Atrial fibrillation on chronic anticoagulation therapy/supratherapeutic INR: 2 chronic INR noted to be 3.14 on admission - Coumadin per pharmacy - Continue amiodarone, digoxin  Chronic systolic congestive heart failure/NICM/ Bi V AICD: Stable. BNP was elevated at 759.5 on admission clinically patient does not appear to be fluid overload. - Strict I&Os  - Continue Coreg, spironolactone, torsemide, hydralazine,  lisinopril - Held potassium chloride as patient's potassium level was upper limit of normal on admission.  CKD stage III: Cr improved to 1.4 - continue to monitor  Essential hypertension - Blood pressure medications as seen above   History of seizure disorder - Continue Keppra  Generalized anxiety: Question if patient symptoms of anxiety worsening and given recent changes. - Continue Ativan prn  Hyperlipidemia - Continue atorvastatin  GERD - Continue pharmacy substitution of Protonix for Nexium   DVT prophylaxis: On Coumadin  Code Status:  DNR Family Communication:  no family present at bedside  Disposition Plan: Possible discharge home in 2-3 days Consults called: Cardiology Admission status: Observation telemetry  Clydie Braun MD [facility] Pager [phone]  If 7PM-7AM, please contact night-coverage [email or web address] Password [facility]  03/05/2016, 10:01 PM

## 2016-03-05 NOTE — Progress Notes (Signed)
ANTICOAGULATION CONSULT NOTE - Initial Consult  Pharmacy Consult for Coumadin Indication: atrial fibrillation  No Known Allergies  Patient Measurements: Height: 6' (182.9 cm) Weight: 174 lb (78.926 kg) IBW/kg (Calculated) : 77.6  Vital Signs: Temp: 98.4 F (36.9 C) (07/22 1927) Temp Source: Oral (07/22 1927) BP: 107/78 mmHg (07/22 2245) Pulse Rate: 79 (07/22 2245)  Labs:  Recent Labs  03/03/16 1030 03/05/16 1942 03/05/16 1947  HGB 15.0 14.9 16.3  HCT 45.0 44.7 48.0  PLT 259 279  --   LABPROT  --  36.1*  --   INR  --  3.74*  --   CREATININE 2.68* 1.40* 1.40*  TROPONINI 0.03*  --   --     Estimated Creatinine Clearance: 55.4 mL/min (by C-G formula based on Cr of 1.4).   Medical History: Past Medical History  Diagnosis Date  . Seizures (HCC)   . Essential hypertension   . Chronic systolic heart failure (HCC)   . Atrial fibrillation (HCC)   . Cardiomyopathy (HCC)   . PE (pulmonary embolism) March 2016  . COPD (chronic obstructive pulmonary disease) (HCC)   . Pulmonary HTN (HCC)     Rt heart cath June 2016  . Diastolic dysfunction     Grade 3  . 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 SAH (subarachnoid hemorrhage) (HCC) Dec 2015  . Chronic renal disease, stage III     Medications:  No current facility-administered medications on file prior to encounter.   Current Outpatient Prescriptions on File Prior to Encounter  Medication Sig Dispense Refill  . acetaminophen (TYLENOL) 500 MG tablet Take 1,000 mg by mouth 2 (two) times daily as needed for mild pain or headache.     . albuterol (PROVENTIL HFA;VENTOLIN HFA) 108 (90 Base) MCG/ACT inhaler Inhale 2 puffs into the lungs every 6 (six) hours as needed for wheezing or shortness of breath. 1 Inhaler 0  . amiodarone (PACERONE) 200 MG tablet Take 1 tablet (200 mg total) by mouth 2 (two) times daily. 60 tablet 6  . atorvastatin (LIPITOR) 40 MG tablet Take 40 mg by mouth every morning.   0  . carvedilol  (COREG) 6.25 MG tablet Take 1 tablet (6.25 mg total) by mouth 2 (two) times daily with a meal. 60 tablet 6  . esomeprazole (NEXIUM) 20 MG capsule Take 20 mg by mouth daily at 12 noon.    . hydrALAZINE (APRESOLINE) 25 MG tablet Take 1 tablet (25 mg total) by mouth 3 (three) times daily. (Patient taking differently: Take 25 mg by mouth every 8 (eight) hours. ) 90 tablet 6  . isosorbide mononitrate (IMDUR) 30 MG 24 hr tablet Take 1 tablet (30 mg total) by mouth daily. 30 tablet 6  . levETIRAcetam (KEPPRA) 500 MG tablet Take 500 mg by mouth daily.     Marland Kitchen LORazepam (ATIVAN) 0.5 MG tablet Take 0.5 mg by mouth every 8 (eight) hours as needed for anxiety. Reported on 02/23/2016    . nitroGLYCERIN (NITROSTAT) 0.4 MG SL tablet Place 1 tablet (0.4 mg total) under the tongue every 5 (five) minutes as needed for chest pain. 30 tablet 0  . warfarin (COUMADIN) 5 MG tablet Take 1 tablet (5 mg total) by mouth daily. (Patient taking differently: Take 5 mg by mouth every morning. ) 30 tablet 6     Assessment: 70 y.o. male admitted with chest pain, h/o Afib, to continue Coumadin.  INR supratherapeutic tonight Goal of Therapy:  INR 2-3 Monitor platelets by anticoagulation  protocol: Yes   Plan:  No Coumadin tonight DailY INR  [name] 03/05/2016,11:26 PM

## 2016-03-05 NOTE — ED Notes (Signed)
Family requesting an update; will inform Dr.Zavitz

## 2016-03-05 NOTE — ED Notes (Signed)
Pt. arrived with EMS from home reports intermittent central chest pain /tightness with SOB  , nausea and diaphoresis , received 4 baby ASA , Morphine 4 mg IV and NTG sl x2 with slight relief.

## 2016-03-05 NOTE — ED Notes (Signed)
Attempted to call report

## 2016-03-05 NOTE — ED Provider Notes (Signed)
CSN: [id number]     Arrival date & time 03/05/16  1921 History   First MD Initiated Contact with Patient 03/05/16 1926     Chief Complaint  Patient presents with  . Chest Pain     (Consider location/radiation/quality/duration/timing/severity/associated sxs/prior Treatment) HPI Comments: 70 year old male with complicated medical history including heart failure, renal disease, stroke, probably hypertension, cardiomyopathy presents with intermittent central chest pain since last night mild shortness of breath and diaphoresis. Patient was code STEMI in the field called. Cardiology aware. Patient is also decreased appetite since discharge recently. Patient multiple medication changes recently. Patient does feel general fatigue. No fevers or chills. Chest pain mild improved since aspirin prior to arrival.  The history is provided by the patient and the EMS personnel.    Past Medical History  Diagnosis Date  . Seizures (HCC)   . Essential hypertension   . Chronic systolic heart failure (HCC)   . Atrial fibrillation (HCC)   . Cardiomyopathy (HCC)   . PE (pulmonary embolism) March 2016  . COPD (chronic obstructive pulmonary disease) (HCC)   . Pulmonary HTN (HCC)     Rt heart cath June 2016  . Diastolic dysfunction     Grade 3  . 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 SAH (subarachnoid hemorrhage) (HCC) Dec 2015  . Chronic renal disease, stage III    Past Surgical History  Procedure Laterality Date  . Insertion of icd  09/25/12    Bi V [employer]   . Total knee arthroplasty Bilateral   . Ankle surgery    . Ivc filter placement ([facility] hx)  March 2016  . Ivc filter removal ([facility] hx)  Oct 2016  . Cardiac catheterization N/A 02/25/2016    Procedure: Right/Left Heart Cath and Coronary Angiography;  Surgeon: Laurey Morale, MD;  Location: [facility] INVASIVE CV LAB;  Service: Cardiovascular;  Laterality: N/A;  . Coronary angioplasty    . Coronary angioplasty     Family History  Problem  Relation Age of Onset  . Cardiomyopathy    . Sudden death     Social History  Substance Use Topics  . Smoking status: Never Smoker   . Smokeless tobacco: None  . Alcohol Use: No    Review of Systems  Constitutional: Positive for appetite change. Negative for fever and chills.  HENT: Negative for congestion.   Eyes: Negative for visual disturbance.  Respiratory: Positive for shortness of breath.   Cardiovascular: Positive for chest pain.  Gastrointestinal: Positive for nausea. Negative for vomiting and abdominal pain.  Genitourinary: Negative for dysuria and flank pain.  Musculoskeletal: Negative for back pain, neck pain and neck stiffness.  Skin: Negative for rash.  Neurological: Positive for light-headedness. Negative for headaches.      Allergies  Review of patient's allergies indicates no known allergies.  Home Medications   Prior to Admission medications   Medication Sig Start Date End Date Taking? Authorizing Provider  acetaminophen (TYLENOL) 500 MG tablet Take 1,000 mg by mouth 2 (two) times daily as needed for mild pain or headache.    Yes Historical Provider, MD  acetaminophen-codeine (TYLENOL #3) 300-30 MG tablet Take 1 tablet by mouth daily as needed for moderate pain.   Yes Historical Provider, MD  albuterol (PROVENTIL HFA;VENTOLIN HFA) 108 (90 Base) MCG/ACT inhaler Inhale 2 puffs into the lungs every 6 (six) hours as needed for wheezing or shortness of breath. 02/12/16  Yes [name], MD  amiodarone (PACERONE) 200 MG tablet  Take 1 tablet (200 mg total) by mouth 2 (two) times daily. 03/01/16  Yes Graciella Freer, PA-C  atorvastatin (LIPITOR) 40 MG tablet Take 40 mg by mouth every morning.  01/27/16  Yes Historical Provider, MD  carvedilol (COREG) 6.25 MG tablet Take 1 tablet (6.25 mg total) by mouth 2 (two) times daily with a meal. 03/01/16  Yes Graciella Freer, PA-C  digoxin (LANOXIN) 0.125 MG tablet Take 125 mcg by mouth daily. 03/01/16  Yes Historical  Provider, MD  esomeprazole (NEXIUM) 20 MG capsule Take 20 mg by mouth daily at 12 noon.   Yes Historical Provider, MD  hydrALAZINE (APRESOLINE) 25 MG tablet Take 1 tablet (25 mg total) by mouth 3 (three) times daily. Patient taking differently: Take 25 mg by mouth every 8 (eight) hours.  03/01/16  Yes Graciella Freer, PA-C  isosorbide mononitrate (IMDUR) 30 MG 24 hr tablet Take 1 tablet (30 mg total) by mouth daily. 03/01/16  Yes Graciella Freer, PA-C  levETIRAcetam (KEPPRA) 500 MG tablet Take 500 mg by mouth daily.    Yes Historical Provider, MD  lisinopril (PRINIVIL,ZESTRIL) 2.5 MG tablet Take 2.5 mg by mouth 2 (two) times daily.  12/20/15  Yes Historical Provider, MD  LORazepam (ATIVAN) 0.5 MG tablet Take 0.5 mg by mouth every 8 (eight) hours as needed for anxiety. Reported on 02/23/2016   Yes Historical Provider, MD  nitroGLYCERIN (NITROSTAT) 0.4 MG SL tablet Place 1 tablet (0.4 mg total) under the tongue every 5 (five) minutes as needed for chest pain. 02/03/16  Yes Devoria Albe, MD  potassium chloride (K-DUR) 10 MEQ tablet Take 20 mEq by mouth 2 (two) times daily. 03/01/16  Yes Historical Provider, MD  spironolactone (ALDACTONE) 25 MG tablet Take 12.5 mg by mouth daily.   Yes Historical Provider, MD  torsemide (DEMADEX) 20 MG tablet Take 10 mg by mouth daily. 03/01/16  Yes Historical Provider, MD  warfarin (COUMADIN) 5 MG tablet Take 1 tablet (5 mg total) by mouth daily. Patient taking differently: Take 5 mg by mouth every morning.  03/01/16  Yes Mariam Dollar Tillery, PA-C   BP 108/88 mmHg  Pulse 79  Temp(Src) 98.4 F (36.9 C) (Oral)  Resp 28  Ht 6' (1.829 m)  Wt 174 lb (78.926 kg)  BMI 23.59 kg/m2  SpO2 98% Physical Exam  Constitutional: He is oriented to person, place, and time. He appears well-developed and well-nourished.  HENT:  Head: Normocephalic and atraumatic.  Mild dry mucous membranes  Eyes: Right eye exhibits no discharge. Left eye exhibits no discharge.  Neck:  Normal range of motion. Neck supple. No tracheal deviation present.  Cardiovascular: Normal rate and regular rhythm.   Pulmonary/Chest: Effort normal and breath sounds normal.  Abdominal: Soft. He exhibits no distension. There is no tenderness. There is no guarding.  Musculoskeletal: He exhibits no edema.  Neurological: He is alert and oriented to person, place, and time. No cranial nerve deficit.  Skin: Skin is warm. No rash noted.  Psychiatric: He has a normal mood and affect.  Nursing note and vitals reviewed.   ED Course  Procedures (including critical care time) Labs Review Labs Reviewed  BASIC METABOLIC PANEL - Abnormal; Notable for the following:    Sodium 130 (*)    Chloride 98 (*)    Glucose, Bld 104 (*)    BUN 36 (*)    Creatinine, Ser 1.40 (*)    GFR calc non Af Amer 50 (*)    GFR calc Af Denyse Dago  58 (*)    All other components within normal limits  CBC - Abnormal; Notable for the following:    WBC 3.8 (*)    All other components within normal limits  PROTIME-INR - Abnormal; Notable for the following:    Prothrombin Time 36.1 (*)    INR 3.74 (*)    All other components within normal limits  I-STAT CHEM 8, ED - Abnormal; Notable for the following:    Sodium 133 (*)    Potassium 5.3 (*)    Chloride 97 (*)    BUN 40 (*)    Creatinine, Ser 1.40 (*)    Glucose, Bld 103 (*)    All other components within normal limits  URINALYSIS, ROUTINE W REFLEX MICROSCOPIC (NOT AT [facility])  BRAIN NATRIURETIC PEPTIDE  T4, FREE  I-STAT TROPOININ, ED    Imaging Review Dg Chest Portable 1 View  03/05/2016  CLINICAL DATA:  Chest pain x2 days EXAM: PORTABLE CHEST 1 VIEW COMPARISON:  03/03/2016 FINDINGS: Increased interstitial markings, chronic. No frank interstitial edema. No pleural effusion or pneumothorax. Cardiomegaly.  Left subclavian pacemaker. IMPRESSION: No evidence of acute cardiopulmonary disease. Electronically Signed   By: Charline Bills M.D.   On: 03/05/2016 19:55   I have  personally reviewed and evaluated these images and lab results as part of my medical decision-making.   EKG Interpretation   Date/Time:  Saturday March 05 2016 19:26:47 EDT Ventricular Rate:  83 PR Interval:    QRS Duration: 154 QT Interval:  386 QTC Calculation: 454 R Axis:   -64 Text Interpretation:  Sinus rhythm LVH with IVCD, LAD and secondary repol  abnrm Lateral leads are also involved Probable RV involvement, suggest  recording right precordial leads overall similar to previous Confirmed by  Sandeep Delagarza MD, Jasmeen Fritsch [phone]) on 03/05/2016 7:31:23 PM      MDM   Final diagnoses:  Chest pain, unspecified chest pain type   Patient presents after persistent chest pain. EKG showed left bundle branch block and per EMS EKG changes on route. Patient had aspirin prior to arrival. Patient had recent admission the hospital and multiple medication changes and assessments.  On exam patient overall well-appearing mild to moderate chest pain. EKG overall similar to previous EKGs. Discussed the case immediately with Dr. Excell Seltzer and cardiology fellow who do not feel this in acute heart attack. Plan for medication management. They recommend observation to internal medicine/hospitalist.  Patient improved on reassessment no significant chest pain. Discussed again with cardiology recommended observation. Discussed with [facility] hospitalist for telemetry observation  The patients results and plan were reviewed and discussed.   Any x-rays performed were independently reviewed by myself.   Differential diagnosis were considered with the presenting HPI.  Medications - No data to display  Filed Vitals:   03/05/16 2130 03/05/16 2145 03/05/16 2200 03/05/16 2215  BP: 113/77 106/79 108/84 111/78  Pulse: 80 80 80 79  Temp:      TempSrc:      Resp: 13 28 16 16   Height:      Weight:      SpO2: 98% 95% 98% 97%    Final diagnoses:  Chest pain, unspecified chest pain type    Admission/ observation were  discussed with the admitting physician, patient and/or family and they are comfortable with the plan.    Blane Ohara, MD 03/05/16 2221

## 2016-03-05 NOTE — ED Notes (Signed)
Dr Zavitz at bedside  

## 2016-03-06 ENCOUNTER — Encounter (HOSPITAL_COMMUNITY): Payer: Self-pay | Admitting: Internal Medicine

## 2016-03-06 DIAGNOSIS — R079 Chest pain, unspecified: Secondary | ICD-10-CM | POA: Diagnosis not present

## 2016-03-06 DIAGNOSIS — Z7901 Long term (current) use of anticoagulants: Secondary | ICD-10-CM | POA: Diagnosis not present

## 2016-03-06 DIAGNOSIS — I5023 Acute on chronic systolic (congestive) heart failure: Secondary | ICD-10-CM | POA: Diagnosis not present

## 2016-03-06 DIAGNOSIS — I472 Ventricular tachycardia: Secondary | ICD-10-CM | POA: Diagnosis not present

## 2016-03-06 DIAGNOSIS — I4891 Unspecified atrial fibrillation: Secondary | ICD-10-CM | POA: Diagnosis not present

## 2016-03-06 DIAGNOSIS — J449 Chronic obstructive pulmonary disease, unspecified: Secondary | ICD-10-CM | POA: Diagnosis not present

## 2016-03-06 LAB — COMPREHENSIVE METABOLIC PANEL
ALT: 24 U/L (ref 17–63)
AST: 30 U/L (ref 15–41)
Albumin: 3.6 g/dL (ref 3.5–5.0)
Alkaline Phosphatase: 63 U/L (ref 38–126)
Anion gap: 7 (ref 5–15)
BILIRUBIN TOTAL: 1.1 mg/dL (ref 0.3–1.2)
BUN: 31 mg/dL — AB (ref 6–20)
CALCIUM: 9.8 mg/dL (ref 8.9–10.3)
CO2: 23 mmol/L (ref 22–32)
CREATININE: 1.18 mg/dL (ref 0.61–1.24)
Chloride: 102 mmol/L (ref 101–111)
GFR calc Af Amer: >60 mL/min (ref >60)
Glucose, Bld: 89 mg/dL (ref 65–99)
Potassium: 4.8 mmol/L (ref 3.5–5.1)
Sodium: 132 mmol/L — ABNORMAL LOW (ref 135–145)
TOTAL PROTEIN: 8.1 g/dL (ref 6.5–8.1)

## 2016-03-06 LAB — CBC
HEMATOCRIT: 43.1 % (ref 39.0–52.0)
Hemoglobin: 14.1 g/dL (ref 13.0–17.0)
MCH: 30.2 pg (ref 26.0–34.0)
MCHC: 32.7 g/dL (ref 30.0–36.0)
MCV: 92.3 fL (ref 78.0–100.0)
Platelets: 249 10*3/uL (ref 150–400)
RBC: 4.67 MIL/uL (ref 4.22–5.81)
RDW: 13.3 % (ref 11.5–15.5)
WBC: 3.5 10*3/uL — AB (ref 4.0–10.5)

## 2016-03-06 LAB — TROPONIN I
TROPONIN I: 0.04 ng/mL — AB (ref <0.03)
TROPONIN I: 0.04 ng/mL — AB (ref <0.03)
Troponin I: 0.04 ng/mL (ref <0.03)

## 2016-03-06 LAB — URINALYSIS, ROUTINE W REFLEX MICROSCOPIC
BILIRUBIN URINE: NEGATIVE
Glucose, UA: NEGATIVE mg/dL
HGB URINE DIPSTICK: NEGATIVE
KETONES UR: NEGATIVE mg/dL
Leukocytes, UA: NEGATIVE
NITRITE: NEGATIVE
Protein, ur: NEGATIVE mg/dL
SPECIFIC GRAVITY, URINE: 1.015 (ref 1.005–1.030)
pH: 5 (ref 5.0–8.0)

## 2016-03-06 LAB — PROTIME-INR
INR: 3.97 — AB (ref 0.00–1.49)
PROTHROMBIN TIME: 37.8 s — AB (ref 11.6–15.2)

## 2016-03-06 LAB — T4, FREE: FREE T4: 5.24 ng/dL — AB (ref 0.61–1.12)

## 2016-03-06 MED ORDER — NITROGLYCERIN 0.4 MG SL SUBL
0.4000 mg | SUBLINGUAL_TABLET | SUBLINGUAL | Status: DC | PRN
Start: 2016-03-06 — End: 2016-03-10
  Administered 2016-03-06 – 2016-03-09 (×5): 0.4 mg via SUBLINGUAL
  Filled 2016-03-06: qty 1

## 2016-03-06 MED ORDER — DIGOXIN 125 MCG PO TABS
125.0000 ug | ORAL_TABLET | ORAL | Status: DC
  Administered 2016-03-08 – 2016-03-10 (×2): 125 ug via ORAL
  Filled 2016-03-06 (×3): qty 1

## 2016-03-06 MED ORDER — MORPHINE SULFATE (PF) 2 MG/ML IV SOLN
1.0000 mg | INTRAVENOUS | Status: DC | PRN
  Administered 2016-03-06 – 2016-03-07 (×2): 2 mg via INTRAVENOUS
  Administered 2016-03-07 (×2): 1 mg via INTRAVENOUS
  Filled 2016-03-06 (×4): qty 1

## 2016-03-06 MED ORDER — NITROGLYCERIN 0.4 MG SL SUBL
SUBLINGUAL_TABLET | SUBLINGUAL | Status: AC
  Administered 2016-03-06: 0.4 mg via SUBLINGUAL
  Filled 2016-03-06: qty 1

## 2016-03-06 MED ORDER — GI COCKTAIL ~~LOC~~
ORAL | Status: AC
  Filled 2016-03-06: qty 30

## 2016-03-06 NOTE — Progress Notes (Addendum)
PROGRESS NOTE    Johnathan Morrison  OAC:[id number] DOB: 12-07-1945 DOA: 03/05/2016 PCP: [facility]    Brief Narrative:  Johnathan Morrison is a 70 y.o. 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   Assessment & Plan:   Principal Problem:   Chest pain Active Problems:   A-fib (HCC)   Chronic anticoagulation   HTN (hypertension)   History of seizure   Chronic renal disease, stage III   COPD (chronic obstructive pulmonary disease) (HCC)   NSVT (nonsustained ventricular tachycardia) (HCC)   Acute on chronic systolic congestive heart failure (HCC)   Left sided chest pain over the ICD, tender to touch:  Relieved with morphine.  EKG unchanged.  Mildly elevated troponins.  Cardiology consulted and recommendations given.  Palliative care consulted.    Chronic systolic heart failure: He appears to be compensated.  Resume torsemide, spironolactone, lisinopril and coreg.    Non ischemic cardiomyopathy: Resume lipitor.   Atrial fibrillation: Rate controlled.  Resume coumadin as per pharmacy.  Supra therapeutic INR.  Also on amiodarone and digoxin. .   Mildly elevated free t4: ? Amiodarone induced.  Low TSH.   Seizures: No seizure activity .  Resume  Keppra.   Hyperlipidemia: Resume lipitor.    COPD; No wheezing, resume bronchodilators as needed.    Hypertension: well controlled.    DVT prophylaxis: (coumadin/) Code Status: DNR Family Communication: none at bedside Disposition Plan: (pending further eval.    Consultants:   Cardiology.    Procedures: none    Antimicrobials: none   Subjective: Reported some chest pain around the ICD , tender to touch.  Objective: Vitals:   03/06/16 0823 03/06/16 0929 03/06/16 1225 03/06/16 1647  BP: 102/61 99/64 102/67 97/61    Pulse: 85 87 85 81  Resp:      Temp: 97.7 F (36.5 C)  98.2 F (36.8 C) 97.7 F (36.5 C)  TempSrc: Oral  Oral Oral  SpO2: 98% 98% 98% 97%  Weight:      Height:        Intake/Output Summary (Last 24 hours) at 03/06/16 1927 Last data filed at 03/06/16 1544  Gross per 24 hour  Intake              360 ml  Output             1050 ml  Net             -690 ml   Filed Weights   03/05/16 1927 03/06/16 0002  Weight: 78.9 kg (174 lb) 79.9 kg (176 lb 1.6 oz)    Examination:  General exam: anxious .  Respiratory system: Clear to auscultation. Respiratory effort normal. Cardiovascular system: S1 & S2 heard, no murmers.  Gastrointestinal system: Abdomen is nondistended, soft and nontender. No organomegaly or masses felt. Normal bowel sounds heard. Central nervous system: Alert and oriented. No focal neurological deficits. Extremities: Symmetric 5 x 5 power. Skin: No rashes, lesions or ulcers     Data Reviewed: I have personally reviewed following labs and imaging studies  CBC:  Recent Labs Lab 02/29/16 0453 03/03/16 1030 03/05/16 1942 03/05/16 1947 03/06/16 0318  WBC 5.6 4.3 3.8*  --  3.5*  NEUTROABS  --  2.4  --   --   --   HGB 15.6 15.0 14.9 16.3 14.1  HCT 47.2 45.0  44.7 48.0 43.1  MCV 92.9 91.8 92.2  --  92.3  PLT 272 259 279  --  249   Basic Metabolic Panel:  Recent Labs Lab 02/29/16 0453 03/01/16 0345 03/03/16 1030 03/05/16 1942 03/05/16 1947 03/06/16 0318  NA 129* 129* 128* 130* 133* 132*  K 4.6 4.2 4.7 5.1 5.3* 4.8  CL 90* 93* 94* 98* 97* 102  CO2 --  23  GLUCOSE 89 98 92 104* 103* 89  BUN 35* 39* 68* 36* 40* 31*  CREATININE 1.73* 1.56* 2.68* 1.40* 1.40* 1.18  CALCIUM 10.2 10.3 9.5 10.0  --  9.8   GFR: Estimated Creatinine Clearance: 65.8 mL/min (by C-G formula based on SCr of 1.18 mg/dL). Liver Function Tests:  Recent Labs Lab 03/03/16 1030 03/06/16 0318  AST 32 30  ALT 27 24  ALKPHOS 77 63  BILITOT 1.6* 1.1  PROT 9.1* 8.1   ALBUMIN 4.2 3.6   No results for input(s): LIPASE, AMYLASE in the last 168 hours. No results for input(s): AMMONIA in the last 168 hours. Coagulation Profile:  Recent Labs Lab 02/29/16 0453 03/01/16 0345 03/05/16 1942 03/06/16 0318  INR 2.19* 2.54* 3.74* 3.97*   Cardiac Enzymes:  Recent Labs Lab 03/01/16 1010 03/03/16 1030 03/06/16 0012 03/06/16 0318 03/06/16 1001  TROPONINI 0.04* 0.03* 0.04* 0.04* 0.04*   BNP (last 3 results) No results for input(s): PROBNP in the last 8760 hours. HbA1C: No results for input(s): HGBA1C in the last 72 hours. CBG: No results for input(s): GLUCAP in the last 168 hours. Lipid Profile: No results for input(s): CHOL, HDL, LDLCALC, TRIG, CHOLHDL, LDLDIRECT in the last 72 hours. Thyroid Function Tests:  Recent Labs  03/05/16 2229  FREET4 5.24*   Anemia Panel: No results for input(s): VITAMINB12, FOLATE, FERRITIN, TIBC, IRON, RETICCTPCT in the last 72 hours. Sepsis Labs:  Recent Labs Lab 03/03/16 1146  LATICACIDVEN 1.65    Recent Results (from the past 240 hour(s))  Culture, blood (routine x 2)     Status: None (Preliminary result)   Collection Time: 03/03/16 12:15 PM  Result Value Ref Range Status   Specimen Description BLOOD RIGHT ANTECUBITAL  Final   Special Requests BOTTLES DRAWN AEROBIC AND ANAEROBIC 5CC EACH  Final   Culture NO GROWTH 3 DAYS  Final   Report Status PENDING  Incomplete  Culture, blood (routine x 2)     Status: None (Preliminary result)   Collection Time: 03/03/16 12:25 PM  Result Value Ref Range Status   Specimen Description BLOOD RIGHT FOREARM  Final   Special Requests BOTTLES DRAWN AEROBIC AND ANAEROBIC 5CC EACH  Final   Culture NO GROWTH 3 DAYS  Final   Report Status PENDING  Incomplete         Radiology Studies: Dg Chest Portable 1 View  Result Date: 03/05/2016 CLINICAL DATA:  Chest pain x2 days EXAM: PORTABLE CHEST 1 VIEW COMPARISON:  03/03/2016 FINDINGS: Increased interstitial markings,  chronic. No frank interstitial edema. No pleural effusion or pneumothorax. Cardiomegaly.  Left subclavian pacemaker. IMPRESSION: No evidence of acute cardiopulmonary disease. Electronically Signed   By: Charline Bills M.D.   On: 03/05/2016 19:55        Scheduled Meds: . amiodarone  200 mg Oral BID  . atorvastatin  40 mg Oral Daily  . carvedilol  6.25 mg Oral BID WC  . [START ON 03/08/2016] digoxin  125 mcg Oral QODAY  . hydrALAZINE  25 mg Oral Q8H  . isosorbide mononitrate  30 mg Oral Daily  . levETIRAcetam  500 mg Oral Daily  . lisinopril  2.5 mg Oral BID  . pantoprazole  40 mg Oral Daily  . spironolactone  12.5 mg Oral Daily  . torsemide  10 mg Oral Daily  . Warfarin - Pharmacist Dosing Inpatient   Does not apply q1800   Continuous Infusions:    LOS: 0 days    Time spent: 25 minutes.     Kathlen Mody, MD [facility] Pager [phone]  If 7PM-7AM, please contact night-coverage [email or web address] Password [facility] 03/06/2016, 7:27 PM

## 2016-03-06 NOTE — Progress Notes (Signed)
Troponin 0.04, no s/s, MD notified and aware, THanks, Lavonda Jumbo RN

## 2016-03-06 NOTE — Progress Notes (Signed)
ANTICOAGULATION CONSULT NOTE Pharmacy Consult for Coumadin Indication: atrial fibrillation  No Known Allergies  Patient Measurements: Height: 6' (182.9 cm) Weight: 176 lb 1.6 oz (79.9 kg) (b scale) IBW/kg (Calculated) : 77.6  Vital Signs: Temp: 97.7 F (36.5 C) (07/23 0823) Temp Source: Oral (07/23 0823) BP: 99/64 (07/23 0929) Pulse Rate: 87 (07/23 0929)  Labs:  Recent Labs  03/05/16 1942 03/05/16 1947 03/06/16 0012 03/06/16 0318 03/06/16 1001  HGB 14.9 16.3  --  14.1  --   HCT 44.7 48.0  --  43.1  --   PLT 279  --   --  249  --   LABPROT 36.1*  --   --  37.8*  --   INR 3.74*  --   --  3.97*  --   CREATININE 1.40* 1.40*  --  1.18  --   TROPONINI  --   --  0.04* 0.04* 0.04*    Estimated Creatinine Clearance: 65.8 mL/min (by C-G formula based on SCr of 1.18 mg/dL).  Assessment: 70 y.o. male admitted with chest pain, h/o Afib, to continue Coumadin.  INR supratherapeutic today at 3.97  Goal of Therapy:  INR 2-3 Monitor platelets by anticoagulation protocol: Yes   Plan:  No Coumadin tonight Daily INR  Thank you Okey Regal, PharmD [phone] 03/06/2016,11:29 AM

## 2016-03-06 NOTE — Progress Notes (Signed)
C/o worsening chest pain Morphine 2mg  given MD made aware. BP 99/64 HR 87.

## 2016-03-06 NOTE — Progress Notes (Signed)
Pt c/o chest pain and slight dizziness after breakfast. Pt is laying in bed watching TV. Will obtain EKG MD made aware.

## 2016-03-06 NOTE — Progress Notes (Signed)
Troponin 0.04, no S/S MD aware, will continue to monitor, Thanks, Lavonda Jumbo RN

## 2016-03-06 NOTE — Progress Notes (Signed)
EKG and Nitro x2 given with little relief. MD notified.

## 2016-03-06 NOTE — Consult Note (Signed)
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  The dyspnea started while he was outside in the heat attempted to take his laundry to the laundromat.  Despite that episode, he overall feels that his heart failure is much better since his recent admission when he diuresed & transiently treated with Milrinone.  From the time of that admission until now, he has lost 16 lbs, which he attributes this to lack of appetite in addition to the diuresis that took place. His dyspnea is at baseline, precipitated by activities such as putting on his clothes & cooking.  His lower extremity swelling is well-controlled with ted-hose.  He denied orthopnea or paroxysmal nocturnal dyspnea.  He eats most meals out, including a daily sausage biscuit for breakfast.  Regarding his history of COPD, he noted increased cough & sputum production recently.  He has recently run out of his nebulizer solution but previously found it helpful.    On further questioning, he stated that his appetite has been diminished since the time of his stroke when he lost his sense of smell.  He expressed significant frustration regarding his quality of life & expressed interest in "being comfortable."  We talked about evaluation by palliative care, & he was  very open to this.   Past Medical History   Past Medical History:  Diagnosis Date  . Atrial fibrillation (HCC)   . Cardiomyopathy (HCC)   . Chronic renal disease, stage III   . Chronic systolic heart failure (HCC)   . COPD (chronic obstructive pulmonary disease) (HCC)   . 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 Diastolic dysfunction    Grade 3  . Essential hypertension   . PE (pulmonary embolism) March 2016  . Pulmonary HTN (HCC)    Rt heart cath June 2016  . SAH (subarachnoid hemorrhage) (HCC) Dec 2015  . Seizures (HCC)     Past Surgical History:  Procedure Laterality Date  . ANKLE SURGERY    . CARDIAC CATHETERIZATION N/A 02/25/2016   Procedure: Right/Left Heart Cath and Coronary Angiography;  Surgeon: Laurey Morale, MD;  Location: [facility] INVASIVE CV LAB;  Service: Cardiovascular;  Laterality: N/A;  . CORONARY ANGIOPLASTY    . CORONARY ANGIOPLASTY    . INSERTION OF ICD  09/25/12   Bi V [employer]   . IVC FILTER PLACEMENT [facility] HX)  March 2016  . IVC FILTER REMOVAL [facility] HX)  Oct 2016  . TOTAL KNEE ARTHROPLASTY Bilateral     Allergies  No Known Allergies  Inpatient Medications    . amiodarone  200 mg Oral BID  . atorvastatin  40 mg Oral Daily  . carvedilol  6.25 mg Oral BID WC  . [START ON 03/08/2016] digoxin  125 mcg Oral QODAY  . hydrALAZINE  25 mg  Oral Q8H  . isosorbide mononitrate  30 mg Oral Daily  . levETIRAcetam  500 mg Oral Daily  . lisinopril  2.5 mg Oral BID  . pantoprazole  40 mg Oral Daily  . spironolactone  12.5 mg Oral Daily  . torsemide  10 mg Oral Daily  . Warfarin - Pharmacist Dosing Inpatient   Does not apply q1800   Family History    Family History  Problem Relation Age of Onset  . Cardiomyopathy    . Sudden death     Social History    Social History   Social History  . Marital status: Single    Spouse name: N/A  . Number of children: N/A  . Years of education: N/A   Occupational History  . Not on file.   Social History Main  Topics  . Smoking status: Former Games developer  . Smokeless tobacco: Not on file     Comment: smoked 1.5 ppd x 30 years, quit in 2012)  . Alcohol use No  . Drug use: No  . Sexual activity: Not on file   Other Topics Concern  . Not on file   Social History Narrative  . No narrative on file     Review of Systems    General:  No chills, fever, night sweats or weight changes.  Cardiovascular:  Positive for dyspnea on exertion.  No chest pain, edema, orthopnea, palpitations, paroxysmal nocturnal dyspnea. Dermatological: No rash, lesions/masses Respiratory: No cough, dyspnea Urologic: No hematuria, dysuria Abdominal:   No nausea, vomiting, diarrhea, bright red blood per rectum, melena, or hematemesis Neurologic:  No visual changes, wkns, changes in mental status. All other systems reviewed and are otherwise negative except as noted above.  Physical Exam    Blood pressure 99/64, pulse 87, temperature 97.7 F (36.5 C), temperature source Oral, resp. rate 17, height 6' (1.829 m), weight 79.9 kg (176 lb 1.6 oz), SpO2 98 %.  General: Pleasant, NAD Psych: Normal affect. Neuro: Alert and oriented X 3. Moves all extremities spontaneously. HEENT: Normal  Neck: Supple without bruits or JVD. Lungs:  Resp regular and unlabored, CTA. Heart: RRR no s3, s4, or murmurs. Abdomen: Soft, non-tender, non-distended, BS + x 4.  Extremities: No clubbing, cyanosis or edema. DP/PT/Radials 2+ and equal bilaterally.  Labs    Troponin [facility] of Care Test)  Recent Labs  03/05/16 1946  TROPIPOC 0.04    Recent Labs  03/06/16 0012 03/06/16 0318  TROPONINI 0.04* 0.04*   Lab Results  Component Value Date   WBC 3.5 (L) 03/06/2016   HGB 14.1 03/06/2016   HCT 43.1 03/06/2016   MCV 92.3 03/06/2016   PLT 249 03/06/2016    Recent Labs Lab 03/06/16 0318  NA 132*  K 4.8  CL 102  CO2 23  BUN 31*  CREATININE 1.18  CALCIUM 9.8  PROT 8.1  BILITOT 1.1  ALKPHOS 63  ALT 24  AST 30  GLUCOSE 89   No  results found for: CHOL, HDL, LDLCALC, TRIG Lab Results  Component Value Date   DDIMER 0.91 (H) 02/12/2016     Radiology Studies    Dg Chest 2 View  Result Date: 02/23/2016 CLINICAL DATA:  Shortness of breath for 4 days, worsened over the past 2 days. History of congestive heart failure. EXAM: CHEST  2 VIEW COMPARISON:  Single-view of the chest 02/03/2016. CT chest 02/12/2016. FINDINGS: There is cardiomegaly without pulmonary edema. Trace right pleural effusion is seen and there is mild subsegmental atelectasis in the  right lung base. Aeration is markedly improved since the prior plain film. AICD is noted. No pneumothorax. IMPRESSION: Cardiomegaly and pulmonary vascular congestion. Aeration appears markedly improved since the prior plain film. Trace right pleural effusion and subsegmental atelectasis right lung base. Electronically Signed   By: Drusilla Kanner M.D.   On: 02/23/2016 09:04   Ct Angio Chest Pe W/cm &/or Wo Cm  Result Date: 02/12/2016 CLINICAL DATA:  LEFT side chest pain with shortness of breath for 1 day, history hypertension, pacemaker, CHF EXAM: CT ANGIOGRAPHY CHEST WITH CONTRAST TECHNIQUE: Multidetector CT imaging of the chest was performed using the standard protocol during bolus administration of intravenous contrast. Multiplanar CT image reconstructions and MIPs were obtained to evaluate the vascular anatomy. CONTRAST:  100 cc Isovue 370 IV COMPARISON:  None FINDINGS: Cardiovascular: Few scattered atherosclerotic calcifications aorta and coronary arteries. Upper normal caliber ascending thoracic aorta 3.8 cm diameter. Mild limitations of exam secondary to scattered respiratory motion artifacts. Enlarged central pulmonary arteries. Pulmonary arteries well opacified and grossly patent. No definite evidence of pulmonary embolism. Beam hardening artifacts from LEFT subclavian pacemaker leads in RIGHT atrium, RIGHT ventricle, and coronary sinus. Enlargement of cardiac chambers and  cardiac size. Mediastinum/Nodes: Esophagus unremarkable. Scattered normal size mediastinal lymph nodes. Lungs/Pleura: Small RIGHT pleural effusion. Scattered respiratory motion artifacts. Minimal compressive atelectasis RIGHT lower lobe. Peripheral interstitial changes at both lung bases. No additional infiltrate, pneumothorax, or LEFT pleural effusion. Upper Abdomen: Distended IVC and hepatic veins with reflux of contrast question elevated RIGHT heart pressure versus sequela power injection. Remaining upper abdomen normal. Musculoskeletal: No acute osseous findings. Review of the MIP images confirms the above findings. IMPRESSION: No gross evidence of pulmonary embolism identified on exam slightly limited by rash to motion artifacts. Question pulmonary arterial hypertension. Small RIGHT pleural effusion with minimal basilar atelectasis. Enlargement of cardiac chambers with evidence of prior pacemaker placement. Electronically Signed   By: Ulyses Southward M.D.   On: 02/12/2016 14:42   Dg Chest Portable 1 View  Result Date: 03/05/2016 CLINICAL DATA:  Chest pain x2 days EXAM: PORTABLE CHEST 1 VIEW COMPARISON:  03/03/2016 FINDINGS: Increased interstitial markings, chronic. No frank interstitial edema. No pleural effusion or pneumothorax. Cardiomegaly.  Left subclavian pacemaker. IMPRESSION: No evidence of acute cardiopulmonary disease. Electronically Signed   By: Charline Bills M.D.   On: 03/05/2016 19:55   Dg Chest Portable 1 View  Result Date: 03/03/2016 CLINICAL DATA:  Shortness of Breath EXAM: PORTABLE CHEST 1 VIEW COMPARISON:  February 27, 2016 FINDINGS: There is patchy infiltrate in the right lower lobe region. Lungs elsewhere clear. There is moderate cardiomegaly with pulmonary vascularity within normal limits. There is atherosclerotic calcification in the aortic arch. Pacemaker lead tips are attached to the right atrium, right ventricle, and left ventricle, stable. No adenopathy. No bone lesions. IMPRESSION:  Patchy infiltrate right base. Lungs elsewhere clear. Stable cardiomegaly. Pacemaker positions unchanged. Aortic atherosclerosis. Followup PA and lateral chest radiographs recommended in 3-4 weeks following trial of antibiotic therapy to ensure resolution and exclude underlying malignancy. Electronically Signed   By: Bretta Bang III M.D.   On: 03/03/2016 10:46   Dg Chest Port 1 View  Result Date: 02/27/2016 CLINICAL DATA:  CHF. EXAM: PORTABLE CHEST 1 VIEW COMPARISON:  02/23/2016. FINDINGS: 1018 hours. The lungs are clear wiithout focal pneumonia, edema, pneumothorax or pleural effusion. The cardio pericardial silhouette is enlarged. Left permanent pacemaker remains in place. The visualized bony structures of the thorax are intact. Telemetry leads overlie the chest. IMPRESSION: Interval  improvement in vascular congestion.  No acute findings. Electronically Signed   By: Kennith Center M.D.   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  - From a heart failure perspective, he appears as relatively well-compensated as possible.  He is euvolemic w Cr the best it has recently been. - It is possible that Amiodarone is worsening his appetite, which had already been compromised by his stroke.  We would advise a palliative care consult to further explore the patient's goals along the spectrum of being comfortable to being aggressive.  If he chooses a more palliative approach the utility of Amiodarone if it is impacting his quality of life could be questioned, particularly in the setting of concomitant thyroid & lung disease. - If he does not choose palliative  care, PT/OT consult for consideration of additional care needs would seem appropriate. - Would advise that further treatment be pursued for his COPD as this may be the more likely culprit of his current presentation with dyspnea. - Continue current heart failure therapies, including  Carvedilol, Hydralazine/Imdur, & Lisinopril.  - As Digoxin goal in HF is < 1, will decrease his dose to every other day.  Signed, Julaine Hua, MD 03/06/2016, 10:30 AM

## 2016-03-07 ENCOUNTER — Inpatient Hospital Stay (HOSPITAL_COMMUNITY): Payer: Medicare Other

## 2016-03-07 DIAGNOSIS — R634 Abnormal weight loss: Secondary | ICD-10-CM

## 2016-03-07 DIAGNOSIS — Z515 Encounter for palliative care: Secondary | ICD-10-CM

## 2016-03-07 DIAGNOSIS — I472 Ventricular tachycardia: Secondary | ICD-10-CM | POA: Diagnosis not present

## 2016-03-07 DIAGNOSIS — I209 Angina pectoris, unspecified: Secondary | ICD-10-CM | POA: Diagnosis not present

## 2016-03-07 DIAGNOSIS — N183 Chronic kidney disease, stage 3 (moderate): Secondary | ICD-10-CM

## 2016-03-07 DIAGNOSIS — Z7189 Other specified counseling: Secondary | ICD-10-CM | POA: Diagnosis not present

## 2016-03-07 DIAGNOSIS — R079 Chest pain, unspecified: Secondary | ICD-10-CM | POA: Diagnosis not present

## 2016-03-07 DIAGNOSIS — I481 Persistent atrial fibrillation: Secondary | ICD-10-CM | POA: Diagnosis not present

## 2016-03-07 DIAGNOSIS — I5023 Acute on chronic systolic (congestive) heart failure: Secondary | ICD-10-CM | POA: Diagnosis not present

## 2016-03-07 LAB — PROTIME-INR
INR: 3.81 — ABNORMAL HIGH (ref 0.00–1.49)
INR: 3.2 — AB (ref 0.00–1.49)
PROTHROMBIN TIME: 32.1 s — AB (ref 11.6–15.2)
Prothrombin Time: 36.7 seconds — ABNORMAL HIGH (ref 11.6–15.2)

## 2016-03-07 LAB — CBC
HCT: 45.5 % (ref 39.0–52.0)
Hemoglobin: 15.2 g/dL (ref 13.0–17.0)
MCH: 30.7 pg (ref 26.0–34.0)
MCHC: 33.4 g/dL (ref 30.0–36.0)
MCV: 91.9 fL (ref 78.0–100.0)
PLATELETS: 283 10*3/uL (ref 150–400)
RBC: 4.95 MIL/uL (ref 4.22–5.81)
RDW: 13.1 % (ref 11.5–15.5)
WBC: 4.2 10*3/uL (ref 4.0–10.5)

## 2016-03-07 LAB — BASIC METABOLIC PANEL
ANION GAP: 12 (ref 5–15)
BUN: 33 mg/dL — ABNORMAL HIGH (ref 6–20)
CALCIUM: 10.3 mg/dL (ref 8.9–10.3)
CO2: 25 mmol/L (ref 22–32)
Chloride: 93 mmol/L — ABNORMAL LOW (ref 101–111)
Creatinine, Ser: 1.41 mg/dL — ABNORMAL HIGH (ref 0.61–1.24)
GFR, EST AFRICAN AMERICAN: 58 mL/min — AB (ref >60)
GFR, EST NON AFRICAN AMERICAN: 50 mL/min — AB (ref >60)
Glucose, Bld: 97 mg/dL (ref 65–99)
Potassium: 4.5 mmol/L (ref 3.5–5.1)
SODIUM: 130 mmol/L — AB (ref 135–145)

## 2016-03-07 LAB — T4, FREE: Free T4: 5.38 ng/dL — ABNORMAL HIGH (ref 0.61–1.12)

## 2016-03-07 LAB — TSH: TSH: 0.027 u[IU]/mL — ABNORMAL LOW (ref 0.350–4.500)

## 2016-03-07 MED ORDER — SODIUM CHLORIDE 0.9 % IV SOLN: 250.0000 mL | INTRAVENOUS | Status: DC | PRN

## 2016-03-07 MED ORDER — AMIODARONE HCL 200 MG PO TABS
200.0000 mg | ORAL_TABLET | Freq: Every day | ORAL | Status: DC
  Filled 2016-03-07: qty 1

## 2016-03-07 MED ORDER — MEXILETINE HCL 150 MG PO CAPS
150.0000 mg | ORAL_CAPSULE | Freq: Three times a day (TID) | ORAL | Status: DC
  Administered 2016-03-07 – 2016-03-10 (×9): 150 mg via ORAL
  Filled 2016-03-07 (×11): qty 1

## 2016-03-07 MED ORDER — ASPIRIN 81 MG PO CHEW
81.0000 mg | CHEWABLE_TABLET | ORAL | Status: AC
  Administered 2016-03-08: 81 mg via ORAL
  Filled 2016-03-07: qty 1

## 2016-03-07 MED ORDER — SODIUM CHLORIDE 0.9 % IV SOLN
INTRAVENOUS | Status: DC
  Administered 2016-03-08: 09:00:00 via INTRAVENOUS

## 2016-03-07 MED ORDER — SODIUM CHLORIDE 0.9% FLUSH: 3.0000 mL | INTRAVENOUS | Status: DC | PRN

## 2016-03-07 MED ORDER — SODIUM CHLORIDE 0.9% FLUSH
3.0000 mL | Freq: Two times a day (BID) | INTRAVENOUS | Status: DC
  Administered 2016-03-07 – 2016-03-08 (×3): 3 mL via INTRAVENOUS

## 2016-03-07 NOTE — Consult Note (Signed)
Consultation Note Date: 03/07/2016   Patient Name: Johnathan Morrison  DOB: [birth year]  MRN: [id number]  Age / Sex: 70 y.o., male  PCP: [place] Referring Physician: Hosie Poisson, MD  Reason for Consultation: Establishing goals of care  HPI/Patient Profile: 70 y.o. male  with past medical history of SAH, CVA with residual left sided weakness, seizures, pulmonary HTN, COPD, systolic HF with EF of 15 - 20%,  CKD stage 3, and afib, who was admitted on 03/05/2016 with chest pain and SOB.  Johnathan Morrison had a recent admission from 7/11 - 7/18 after being shocked by his ICD secondary to multiple episodes of V Tach.  During that hospitalization he had a cardiac cath that showed non-obstructive cardiomyopathy.  During the current hospitalization he was found to be hyperthyroid with a TSH of 0.027.  This was felt to possibly be due to amiodarone and consequently it has been stopped.  Mexiletine is being considered to see if it will help with his cardiac arrhythmias.  Repeat RHC and milrinone are being considered.   Clinical Assessment and Goals of Care: I met with Johnathan Morrison at bedside.  Afterward I spoke with his daughter Evern Bio on the phone.  Johnathan Morrison is a simple man.  He values his independence.  He does not want to live in a nursing facility and he does not want to be a burden on his family.  He wants to function independently.  He was a Nurse, children's.  Lived in [place] and came to [place] not long ago to be closer to his children.  His life has become more difficult since his stroke.  He told me that last year a doctor told him he had 6 - 12 months left to live.  He asks why am I going thru all this if my time is short?  Why doesn't God just take me?  And He asks what do you want me to do? - I'll do whatever you tell me. We discussed milrinone.  I mentioned to him that while it may make him  feel better it will not extend his life, and it will prevent him from being eligible for hospice services.  Johnathan Morrison asks me to call his son and discuss things with him.  I was unable to reach his son, but talked with Evern Bio, his daughter.    She asked what time the RHC is scheduled for as she would like to have a family member here when it is done.  The patient is currently his own decision maker, but he relies on his son to help.      SUMMARY OF RECOMMENDATIONS    DNR / DNI  Will follow up 7/24 - attempt to reach son again to further define goals of care  Await results of repeat RHC and cardiology recommendations  Physical Therapy Evaluation.  Correct Thyroid abnormalities (TSH low, Free T4 high).  Consider outpatient endocrine follow up.  Pending those results - Likely D/C to SNF for acute rehab. Vs Hospice  services  PMT will follow with you.    Symptom Management:   Nutrition consultation for weight loss  Consider changing IV morphine to oxycodone PO for longer lasting CP relief.   Prognosis:   < 6 months given end stage heart failure, physical debility and CKD.  Discharge Planning: To Be Determined likely SNF for acute rehab until he is Hospice appropriate.      Primary Diagnoses: Present on Admission: . Chest pain . COPD (chronic obstructive pulmonary disease) ([place]) . HTN (hypertension) . A-fib ([place]) . Acute on chronic systolic congestive heart failure ([place]) . Chronic renal disease, stage III   I have reviewed the medical record, interviewed the patient and family, and examined the patient. The following aspects are pertinent.  Past Medical History:  Diagnosis Date  . Atrial fibrillation ([place])   . Cardiomyopathy ([place])   . Chronic renal disease, stage III   . Chronic systolic heart failure ([place])   . COPD (chronic obstructive pulmonary disease) ([place])   . CVA (cerebral infarction) Feb 4268   Embolic  . Diastolic dysfunction    Grade 3  .  Essential hypertension   . PE (pulmonary embolism) March 2016  . Pulmonary HTN ([place])    Rt heart cath June 2016  . SAH (subarachnoid hemorrhage) ([place]) Dec 2015  . Seizures [facility])    Social History   Social History  . Marital status: Single    Spouse name: N/A  . Number of children: N/A  . Years of education: N/A   Social History Main Topics  . Smoking status: Former Research scientist (life sciences)  . Smokeless tobacco: None     Comment: smoked 1.5 ppd x 30 years, quit in 2012)  . Alcohol use No  . Drug use: No  . Sexual activity: Not Asked   Other Topics Concern  . None   Social History Narrative  . None   Family History  Problem Relation Age of Onset  . Cardiomyopathy    . Sudden death     Scheduled Meds: . atorvastatin  40 mg Oral Daily  . carvedilol  6.25 mg Oral BID WC  . [START ON 03/08/2016] digoxin  125 mcg Oral QODAY  . hydrALAZINE  25 mg Oral Q8H  . isosorbide mononitrate  30 mg Oral Daily  . levETIRAcetam  500 mg Oral Daily  . lisinopril  2.5 mg Oral BID  . mexiletine  150 mg Oral Q8H  . pantoprazole  40 mg Oral Daily  . spironolactone  12.5 mg Oral Daily  . torsemide  10 mg Oral Daily  . Warfarin - Pharmacist Dosing Inpatient   Does not apply q1800   Continuous Infusions:  PRN Meds:.albuterol, ipratropium, LORazepam, morphine injection, nitroGLYCERIN, ondansetron **OR** ondansetron (ZOFRAN) IV  No Known Allergies Review of Systems:  Patient denies SOB or CP at this time.  Complains of weakness, and decreased appetite  Physical Exam  Thin, pleasant male.  A&O, nondecisional.  Almost childlike.  Slight left sided facial droop. CV regular rate, irregular rhythm Resp:  NAD Abd:  Soft, NT Ext:  No edema.  Able to move all 4.   Vital Signs: BP 91/64 (BP Location: Left Arm)   Pulse 80   Temp 97.4 F (36.3 C) (Oral)   Resp 18   Ht 6' (1.829 m)   Wt 78.2 kg (172 lb 6.4 oz)   SpO2 95%   BMI 23.38 kg/m  Pain Assessment: No/denies pain   Pain Score: 2    SpO2: SpO2: 95  %  O2 Device:SpO2: 95 % O2 Flow Rate: .O2 Flow Rate (L/min): 2 L/min  IO: Intake/output summary:   Intake/Output Summary (Last 24 hours) at 03/07/16 1540 Last data filed at 03/07/16 1006  Gross per 24 hour  Intake              360 ml  Output             1550 ml  Net            -1190 ml    LBM:   Baseline Weight: Weight: 78.9 kg (174 lb) Most recent weight: Weight: 78.2 kg (172 lb 6.4 oz)     Palliative Assessment/Data:     Time In: 2:00 Time Out: 3:30 Time Total: 90 Greater than 50%  of this time was spent counseling and coordinating care related to the above assessment and plan.  Signed by: Imogene Burn, PA-C Palliative Medicine Pager: [phone]   Please contact Palliative Medicine Team phone at [phone] for questions and concerns.  For individual provider: See Shea Evans

## 2016-03-07 NOTE — Progress Notes (Signed)
PROGRESS NOTE    Johnathan Morrison  TRZ:[id number] DOB: 09-02-1945 DOA: 03/05/2016 PCP: [facility]    Brief Narrative:  Johnathan Morrison is a 70 y.o. 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   Assessment & Plan:   Principal Problem:   Chest pain Active Problems:   A-fib (HCC)   Chronic anticoagulation   HTN (hypertension)   History of seizure   Chronic renal disease, stage III   COPD (chronic obstructive pulmonary disease) (HCC)   NSVT (nonsustained ventricular tachycardia) (HCC)   Acute on chronic systolic congestive heart failure (HCC)   Goals of care, counseling/discussion   Palliative care encounter   Loss of weight   Left sided chest pain over the ICD, tender to touch:  Relieved with morphine.  EKG unchanged.  Mildly elevated troponins.      Chronic systolic heart failure: He appears to be compensated.  Resume torsemide, spironolactone, lisinopril and coreg.  Cardiology recommended RHC tomorrow.    Non ischemic cardiomyopathy: Resume lipitor.   Atrial fibrillation: Rate controlled.  Resume coumadin as per pharmacy.  Supra therapeutic INR.  On coreg and digoxin.  Cardiology plan to start him on mexiletine    Mildly elevated free t4: ? Amiodarone induced.  Low TSH, free t3 levels ordered and pending.  Would wait for free t3 levels before starting him on methimazole.   Seizures: No seizure activity .  Resume  Keppra.   Hyperlipidemia: Resume lipitor.    COPD; No wheezing, resume bronchodilators as needed.    Hypertension: bp parameters are sub optimal.   Stage 3 CKD: stable.   DVT prophylaxis: (coumadin) Code Status: DNR Family Communication: none at bedside, will call her son to update.  Disposition Plan: (pending further eval. )   Consultants:     Cardiology.    Procedures: none    Antimicrobials: none   Subjective: Reports feeling tired.  Appetite is same.  Objective: Vitals:   03/07/16 0616 03/07/16 0642 03/07/16 1004 03/07/16 1159  BP: 93/67   91/64  Pulse: 80   80  Resp: 18 18  18   Temp:  97.4 F (36.3 C)    TempSrc:  Oral    SpO2: 100%   95%  Weight:   78.2 kg (172 lb 6.4 oz)   Height:        Intake/Output Summary (Last 24 hours) at 03/07/16 1734 Last data filed at 03/07/16 1006  Gross per 24 hour  Intake              360 ml  Output             1250 ml  Net             -890 ml   Filed Weights   03/05/16 1927 03/06/16 0002 03/07/16 1004  Weight: 78.9 kg (174 lb) 79.9 kg (176 lb 1.6 oz) 78.2 kg (172 lb 6.4 oz)    Examination:  General exam: anxious . But denies any chest pain or sob today.  Respiratory system: Clear to auscultation. Respiratory effort normal. Cardiovascular system: S1 & S2 heard, no murmers.  Gastrointestinal system: Abdomen is nondistended, soft and nontender. No organomegaly or masses felt. Normal bowel sounds heard. Central nervous system: Alert and oriented. No focal neurological deficits. Extremities: Symmetric 5 x 5 power. Skin: No rashes, lesions or ulcers  Data Reviewed: I have personally reviewed following labs and imaging studies  CBC:  Recent Labs Lab 03/03/16 1030 03/05/16 1942 03/05/16 1947 03/06/16 0318 03/07/16 1557  WBC 4.3 3.8*  --  3.5* 4.2  NEUTROABS 2.4  --   --   --   --   HGB 15.0 14.9 16.3 14.1 15.2  HCT 45.0 44.7 48.0 43.1 45.5  MCV 91.8 92.2  --  92.3 91.9  PLT 259 279  --  249 283   Basic Metabolic Panel:  Recent Labs Lab 03/01/16 0345 03/03/16 1030 03/05/16 1942 03/05/16 1947 03/06/16 0318 03/07/16 1557  NA 129* 128* 130* 133* 132* 130*  K 4.2 4.7 5.1 5.3* 4.8 4.5  CL 93* 94* 98* 97* 102 93*  CO2 --  23 25  GLUCOSE 98 92 104* 103* 89 97  BUN 39* 68* 36* 40* 31* 33*  CREATININE 1.56* 2.68* 1.40* 1.40* 1.18 1.41*   CALCIUM 10.3 9.5 10.0  --  9.8 10.3   GFR: Estimated Creatinine Clearance: 55 mL/min (by C-G formula based on SCr of 1.41 mg/dL). Liver Function Tests:  Recent Labs Lab 03/03/16 1030 03/06/16 0318  AST 32 30  ALT 27 24  ALKPHOS 77 63  BILITOT 1.6* 1.1  PROT 9.1* 8.1  ALBUMIN 4.2 3.6   No results for input(s): LIPASE, AMYLASE in the last 168 hours. No results for input(s): AMMONIA in the last 168 hours. Coagulation Profile:  Recent Labs Lab 03/01/16 0345 03/05/16 1942 03/06/16 0318 03/07/16 0312 03/07/16 1557  INR 2.54* 3.74* 3.97* 3.81* 3.20*   Cardiac Enzymes:  Recent Labs Lab 03/01/16 1010 03/03/16 1030 03/06/16 0012 03/06/16 0318 03/06/16 1001  TROPONINI 0.04* 0.03* 0.04* 0.04* 0.04*   BNP (last 3 results) No results for input(s): PROBNP in the last 8760 hours. HbA1C: No results for input(s): HGBA1C in the last 72 hours. CBG: No results for input(s): GLUCAP in the last 168 hours. Lipid Profile: No results for input(s): CHOL, HDL, LDLCALC, TRIG, CHOLHDL, LDLDIRECT in the last 72 hours. Thyroid Function Tests:  Recent Labs  03/07/16 1010  TSH 0.027*  FREET4 5.38*   Anemia Panel: No results for input(s): VITAMINB12, FOLATE, FERRITIN, TIBC, IRON, RETICCTPCT in the last 72 hours. Sepsis Labs:  Recent Labs Lab 03/03/16 1146  LATICACIDVEN 1.65    Recent Results (from the past 240 hour(s))  Culture, blood (routine x 2)     Status: None (Preliminary result)   Collection Time: 03/03/16 12:15 PM  Result Value Ref Range Status   Specimen Description BLOOD RIGHT ANTECUBITAL  Final   Special Requests BOTTLES DRAWN AEROBIC AND ANAEROBIC 5CC EACH  Final   Culture NO GROWTH 4 DAYS  Final   Report Status PENDING  Incomplete  Culture, blood (routine x 2)     Status: None (Preliminary result)   Collection Time: 03/03/16 12:25 PM  Result Value Ref Range Status   Specimen Description BLOOD RIGHT FOREARM  Final   Special Requests BOTTLES DRAWN AEROBIC AND  ANAEROBIC 5CC EACH  Final   Culture NO GROWTH 4 DAYS  Final   Report Status PENDING  Incomplete         Radiology Studies: Dg Chest Portable 1 View  Result Date: 03/05/2016 CLINICAL DATA:  Chest pain x2 days EXAM: PORTABLE CHEST 1 VIEW COMPARISON:  03/03/2016 FINDINGS: Increased interstitial markings, chronic. No frank interstitial edema. No pleural effusion or pneumothorax. Cardiomegaly.  Left subclavian pacemaker. IMPRESSION: No evidence of acute cardiopulmonary disease. Electronically Signed  By: Charline Bills M.D.   On: 03/05/2016 19:55        Scheduled Meds: . [START ON 03/08/2016] aspirin  81 mg Oral Pre-Cath  . atorvastatin  40 mg Oral Daily  . carvedilol  6.25 mg Oral BID WC  . [START ON 03/08/2016] digoxin  125 mcg Oral QODAY  . hydrALAZINE  25 mg Oral Q8H  . isosorbide mononitrate  30 mg Oral Daily  . levETIRAcetam  500 mg Oral Daily  . lisinopril  2.5 mg Oral BID  . mexiletine  150 mg Oral Q8H  . pantoprazole  40 mg Oral Daily  . sodium chloride flush  3 mL Intravenous Q12H  . spironolactone  12.5 mg Oral Daily  . torsemide  10 mg Oral Daily  . Warfarin - Pharmacist Dosing Inpatient   Does not apply q1800   Continuous Infusions: . [START ON 03/08/2016] sodium chloride       LOS: 0 days    Time spent: 25 minutes.     Kathlen Mody, MD [facility] Pager [phone]  If 7PM-7AM, please contact night-coverage [email or web address] Password [facility] 03/07/2016, 5:34 PM

## 2016-03-07 NOTE — Consult Note (Signed)
CARDIOLOGY CONSULT NOTE     Primary Care Physician: [facility] Referring Physician:  Admit Date: 03/05/2016  Reason for consultation:  Johnathan Morrison is a 70 y.o. 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  The dyspnea started while he was outside in the heat attempted to take his laundry to the laundromat.  Despite that episode, he overall feels that his heart failure is much better since his recent admission when he diuresed & transiently treated with Milrinone. He has lost 16 lbs, which he attributes this to lack of appetite and diuresis. His dyspnea is at baseline.  His lower extremity swelling is well-controlled with ted-hose.  He was found to have an elevated T4 and a low TSH and his amiodarone was stopped.   Today, he denies symptoms of palpitations, chest pain, shortness of breath, orthopnea, PND, lower extremity edema, dizziness, presyncope, syncope, or neurologic sequela. The patient is tolerating medications without difficulties and is otherwise without complaint today.   Past Medical History:  Diagnosis Date  . Atrial fibrillation (HCC)   . Cardiomyopathy (HCC)   . Chronic renal disease, stage III   . Chronic systolic heart failure (HCC)   . COPD (chronic obstructive pulmonary disease) (HCC)   . 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 Diastolic dysfunction    Grade 3  . Essential hypertension   . PE (pulmonary embolism) March 2016  . Pulmonary HTN (HCC)    Rt heart cath June 2016  . SAH (subarachnoid hemorrhage) (HCC) Dec 2015  . Seizures (HCC)    Past Surgical History:  Procedure Laterality Date  . ANKLE SURGERY    . CARDIAC CATHETERIZATION N/A 02/25/2016     Procedure: Right/Left Heart Cath and Coronary Angiography;  Surgeon: Laurey Morale, MD;  Location: [facility] INVASIVE CV LAB;  Service: Cardiovascular;  Laterality: N/A;  . CORONARY ANGIOPLASTY    . CORONARY ANGIOPLASTY    . INSERTION OF ICD  09/25/12   Bi V St Jude   . IVC FILTER PLACEMENT [facility] HX)  March 2016  . IVC FILTER REMOVAL [facility] HX)  Oct 2016  . TOTAL KNEE ARTHROPLASTY Bilateral     . atorvastatin  40 mg Oral Daily  . carvedilol  6.25 mg Oral BID WC  . [START ON 03/08/2016] digoxin  125 mcg Oral QODAY  . hydrALAZINE  25 mg Oral Q8H  . isosorbide mononitrate  30 mg Oral Daily  . levETIRAcetam  500 mg Oral Daily  . lisinopril  2.5 mg Oral BID  . pantoprazole  40 mg Oral Daily  . spironolactone  12.5 mg Oral Daily  . torsemide  10 mg Oral Daily  . Warfarin - Pharmacist Dosing Inpatient   Does not apply q1800      No Known Allergies  Social History   Social History  . Marital status: Single    Spouse name: N/A  . Number of children: N/A  . Years of education: N/A   Occupational History  . Not on file.   Social History Main Topics  . Smoking status: Former Games developer  . Smokeless tobacco: Not on file     Comment: smoked 1.5 ppd x 30 years, quit in  2012)  . Alcohol use No  . Drug use: No  . Sexual activity: Not on file   Other Topics Concern  . Not on file   Social History Narrative  . No narrative on file    Family History  Problem Relation Age of Onset  . Cardiomyopathy    . Sudden death      ROS- All systems are reviewed and negative except as per the HPI above  Physical Exam: Telemetry: Vitals:   03/07/16 0547 03/07/16 0616 03/07/16 0642 03/07/16 1004  BP: 96/66 93/67    Pulse: 80 80    Resp:  18 18   Temp:   97.4 F (36.3 C)   TempSrc:   Oral   SpO2:  100%    Weight:    172 lb 6.4 oz (78.2 kg)  Height:        GEN- The patient is well appearing, alert and oriented x 3 today.   Head- normocephalic, atraumatic Eyes-  Sclera clear,  conjunctiva pink Ears- hearing intact Oropharynx- clear Neck- supple, no JVP Lymph- no cervical lymphadenopathy Lungs- Clear to ausculation bilaterally, normal work of breathing Heart- Regular rate and rhythm, no murmurs, rubs or gallops, PMI not laterally displaced GI- soft, NT, ND, + BS Extremities- no clubbing, cyanosis, or edema MS- no significant deformity or atrophy Skin- no rash or lesion Psych- euthymic mood, full affect Neuro- strength and sensation are intact  EKG: AV paced Telemetry: 8 beat run NSVT  Labs:   Lab Results  Component Value Date   WBC 3.5 (L) 03/06/2016   HGB 14.1 03/06/2016   HCT 43.1 03/06/2016   MCV 92.3 03/06/2016   PLT 249 03/06/2016    Recent Labs Lab 03/06/16 0318  NA 132*  K 4.8  CL 102  CO2 23  BUN 31*  CREATININE 1.18  CALCIUM 9.8  PROT 8.1  BILITOT 1.1  ALKPHOS 63  ALT 24  AST 30  GLUCOSE 89   Lab Results  Component Value Date   TROPONINI 0.04 (HH) 03/06/2016   No results found for: CHOL No results found for: HDL No results found for: LDLCALC No results found for: TRIG No results found for: CHOLHDL No results found for: LDLDIRECT    Radiology: No evidence of acute cardiopulmonary disease.  Echo:- Moderate LV chamber dilatation with overall normal wall thickness   and LVEF approximately 15-20%. There is severe diffuse   hypokinesis with regional variation and relative akinesis of the   mid to apical inferior and inferolateral wall. Restrictive   diastolic filling pattern noted with evidence of increased LV   filling pressure. Moderate to severe biatrial enlargement.   Moderate mitral regurgitation. Trivial aortic regurgitation.   Moderate RV dilatation with reduced contraction, device wire   present within the right heart. Moderate tricuspid regurgitation   with PASP estimated 57 mmHg.  Cardiac cath:  1. Nonobstructive coronary disease, nonischemic cardiomyopathy.  2. Mildly elevated RV filling pressure,  near-normal PCWP.  3. Low cardiac output.   ASSESSMENT AND PLAN:  1. Ventricular tachycardia: Recently received a shock for ventricular tachycardia on 7/11. Has not had any recurrent shocks on amiodarone. Unfortunately his thyroid studies show that he is currently hyperthyroid, which could be related to the amiodarone. Due to that, Johnathan Morrison stop the amiodarone. Other options for therapy include both mexiletine and sotalol. As he still has amiodarone in his system, sotalol is not an optimal medication at this time. We'll therefore start him on 150 mg 3 times  a day of mexiletine to see if this Johnathan Morrison help with his ventricular arrhythmias.  2. Congestive heart failure: St. Jude CRT-D in place not by the pacing as this was ineffective. On Coreg, lisinopril as well as hydralazine and Imdur. Management by heart failure.   Johnathan Morrison Jorja Loa, MD 03/07/2016  11:19 AM

## 2016-03-07 NOTE — Progress Notes (Signed)
ANTICOAGULATION CONSULT NOTE Pharmacy Consult for Coumadin Indication: atrial fibrillation  No Known Allergies  Patient Measurements: Height: 6' (182.9 cm) Weight: 172 lb 6.4 oz (78.2 kg) IBW/kg (Calculated) : 77.6  Vital Signs: Temp: 97.4 F (36.3 C) (07/24 0642) Temp Source: Oral (07/24 0642) BP: 91/64 (07/24 1159) Pulse Rate: 80 (07/24 1159)  Labs:  Recent Labs  03/05/16 1942 03/05/16 1947 03/06/16 0012 03/06/16 0318 03/06/16 1001 03/07/16 0312  HGB 14.9 16.3  --  14.1  --   --   HCT 44.7 48.0  --  43.1  --   --   PLT 279  --   --  249  --   --   LABPROT 36.1*  --   --  37.8*  --  36.7*  INR 3.74*  --   --  3.97*  --  3.81*  CREATININE 1.40* 1.40*  --  1.18  --   --   TROPONINI  --   --  0.04* 0.04* 0.04*  --     Estimated Creatinine Clearance: 65.8 mL/min (by C-G formula based on SCr of 1.18 mg/dL).  Assessment: 70 y.o. male admitted with chest pain, h/o Afib, to continue Coumadin.  INR supratherapeutic today at 3.81.  No Coumadin given since admission.  Goal of Therapy:  INR 2-3 Monitor platelets by anticoagulation protocol: Yes   Plan:  No Coumadin tonight Daily INR  Tad Moore, BCPS  Clinical Pharmacist Pager [phone]  03/07/2016 3:05 PM

## 2016-03-07 NOTE — Progress Notes (Signed)
Pt had episode of 7 beat run of V-tach no s/s pt was asleep, MD notified will continue to monitor, Thanks Lavonda Jumbo RN

## 2016-03-07 NOTE — Progress Notes (Signed)
Pt had episode of CP 8/10, gave 2mg  of MS IV, did ECG which was WNL for the pt and the results in the chart, about 25 min it became 2/10, he stated felt better, MD notified and aware, will continue to monitor, Thanks Lavonda Jumbo RN

## 2016-03-07 NOTE — Progress Notes (Signed)
Advanced Heart Failure Rounding Note   Subjective:    Admitted with chest pain, dyspnea, and poor appetite.   On July 20th he presented to the ED with increased dyspnea. Creatinine elevated to 2.68.  Given IV fluids and torsemide, dig, spiro, and potassium stopped. Later discharged. Currently, creatinine back down to 1.18.   Complaining of fatigue and dyspnea on exertion.   RHC 02/25/16 RA mean 9 RV 39/10 PA 36/12, mean 24 PCWP mean 13 LV 79/18 AO 82/56 Oxygen saturations: PA 54% AO 96% Cardiac Output (Fick) 3.94  Cardiac Index (Fick) 1.89  ECG: a-paced, LBBB 144 msec.   Objective:   Weight Range:  Vital Signs:   Temp:  [97.4 F (36.3 C)-98.2 F (36.8 C)] 97.4 F (36.3 C) (07/24 0642) Pulse Rate:  [80-87] 80 (07/24 0616) Resp:  [18] 18 (07/24 0642) BP: (86-102)/(44-73) 93/67 (07/24 0616) SpO2:  [97 %-100 %] 100 % (07/24 0616)    Weight change: Filed Weights   03/05/16 1927 03/06/16 0002  Weight: 174 lb (78.9 kg) 176 lb 1.6 oz (79.9 kg)    Intake/Output:   Intake/Output Summary (Last 24 hours) at 03/07/16 0911 Last data filed at 03/07/16 0600  Gross per 24 hour  Intake              240 ml  Output             1250 ml  Net            -1010 ml     Physical Exam: General:  Fatigued appearing. No resp difficulty. In bed  HEENT: normal Neck: supple. JVP 5-6 . Carotids 2+ bilat; no bruits. No lymphadenopathy or thryomegaly appreciated. Cor: PMI nondisplaced. Regular rate & rhythm. No rubs, or murmurs. + S3  Lungs: clear Abdomen: soft, nontender, nondistended. No hepatosplenomegaly. No bruits or masses. Good bowel sounds. Extremities: no cyanosis, clubbing, rash, edema Neuro: alert & orientedx3, cranial nerves grossly intact. moves all 4 extremities w/o difficulty. Affect pleasant  Telemetry:  SR 80s   Labs: Basic Metabolic Panel:  Recent Labs Lab 03/01/16 0345 03/03/16 1030 03/05/16 1942 03/05/16 1947 03/06/16 0318  NA 129* 128* 130* 133*  132*  K 4.2 4.7 5.1 5.3* 4.8  CL 93* 94* 98* 97* 102  CO2 26 22 24   --  23  GLUCOSE 98 92 104* 103* 89  BUN 39* 68* 36* 40* 31*  CREATININE 1.56* 2.68* 1.40* 1.40* 1.18  CALCIUM 10.3 9.5 10.0  --  9.8    Liver Function Tests:  Recent Labs Lab 03/03/16 1030 03/06/16 0318  AST 32 30  ALT 27 24  ALKPHOS 77 63  BILITOT 1.6* 1.1  PROT 9.1* 8.1  ALBUMIN 4.2 3.6   No results for input(s): LIPASE, AMYLASE in the last 168 hours. No results for input(s): AMMONIA in the last 168 hours.  CBC:  Recent Labs Lab 03/03/16 1030 03/05/16 1942 03/05/16 1947 03/06/16 0318  WBC 4.3 3.8*  --  3.5*  NEUTROABS 2.4  --   --   --   HGB 15.0 14.9 16.3 14.1  HCT 45.0 44.7 48.0 43.1  MCV 91.8 92.2  --  92.3  PLT 259 279  --  249    Cardiac Enzymes:  Recent Labs Lab 03/01/16 1010 03/03/16 1030 03/06/16 0012 03/06/16 0318 03/06/16 1001  TROPONINI 0.04* 0.03* 0.04* 0.04* 0.04*    BNP: BNP (last 3 results)  Recent Labs  02/23/16 0836 03/03/16 1030 03/05/16 2229  BNP 2,021.0* 115.0*  759.5*    ProBNP (last 3 results) No results for input(s): PROBNP in the last 8760 hours.    Other results:  Imaging: Dg Chest Portable 1 View  Result Date: 03/05/2016 CLINICAL DATA:  Chest pain x2 days EXAM: PORTABLE CHEST 1 VIEW COMPARISON:  03/03/2016 FINDINGS: Increased interstitial markings, chronic. No frank interstitial edema. No pleural effusion or pneumothorax. Cardiomegaly.  Left subclavian pacemaker. IMPRESSION: No evidence of acute cardiopulmonary disease. Electronically Signed   By: Charline Bills M.D.   On: 03/05/2016 19:55      Medications:     Scheduled Medications: . amiodarone  200 mg Oral BID  . atorvastatin  40 mg Oral Daily  . carvedilol  6.25 mg Oral BID WC  . [START ON 03/08/2016] digoxin  125 mcg Oral QODAY  . hydrALAZINE  25 mg Oral Q8H  . isosorbide mononitrate  30 mg Oral Daily  . levETIRAcetam  500 mg Oral Daily  . lisinopril  2.5 mg Oral BID  .  pantoprazole  40 mg Oral Daily  . spironolactone  12.5 mg Oral Daily  . torsemide  10 mg Oral Daily  . 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 Also had LHC/RHC earlier this month with nonobstructive CAD, mildly elevated RV filling, and low output.   1. Chest Pain: Troponin 0.04>0.04>0.04. Suspect demand ischemia.  It think that his chest pain was muscular.  2. Acute/Chronic Systolic Heart Failure: St Jude CRT-D device, not set to BiV pace b/c ineffective.  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 NYHA IIIB. He does not appear volume overloaded. - Hold torsemide for now.  - Continue current dose of carvedilol for now. Cut back lisinopril to once daily. Continue hydralazine 25 mg tid and imdur 30 mg daily.  Continue spironolactone 12.5. - Digoxin cut back to every other day.  - Repeat RHC tomorrow. ? Low Output (fatigue, poor appetite) => may need milrinone.  3. H/O VT- VT shock 7/11. Has been amio 200 mg twice a day. Thyroid function abnormal suggesting hyperthyroidism. Stop amio.   Consult EP for alternatives for amio => possibly mexiletine.  4. H/O PAF:  On coumadin. INR 3.8 Pharmacy dosing coumadin  5. AKI: On  7/20 creatinine 2.68, though due to over-diuresis.  Creatinine now down to 1.18.  6. H/O PE: On coumadin.  7. Hyperthyroidism: Repeat TSH, free T3, free T4.  May be contributing to symptoms.  Suspect iodine-triggered from amiodarone use.  Will stop amiodarone for now, suspect he will  need methimazole.  Will check with EP regarding a different anti-arrhythmic, probably mexiletine.  8. DNR.  Length of Stay: 0  Amy Clegg NP-C  03/07/2016, 9:11 AM  Advanced Heart Failure Team Pager [phone] (M-F; 7a - 4p)  Please contact [facility] for night-coverage after hours (4p -7a ) and weekends on [email or web address]  Patient seen with NP, agree with the above note.  He was seen in the ER last Friday with suspected dehydration and AKI, torsemide held.  Now re-admitted with dyspnea, fatigue, poor appetite.  He does not appear volume overloaded.  I am concerned for recurrent low output symptoms.  Hyperthryroidism may also be contributing.  Will hold torsemide today, continue other meds but cut back lisinopril to 2.5 daily with soft BP and recent AKI. Plan RHC tomorrow morning.  Discussed with patient, he agrees to procedure.   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  With appetite issues and hyperthyroidism, think we will need to stop amiodarone.  Will discuss with EP other options for his VT, probably mexiletine will be our best choice at this point as with other class III agents will have to wait for amiodarone to wash out.  May need methimazole, per primary service.   [name] 03/07/2016 10:02 AM

## 2016-03-08 ENCOUNTER — Encounter (HOSPITAL_COMMUNITY)
Admission: EM | Disposition: A | Discharge status: Skilled Nursing Facility | Payer: Self-pay | Source: Home / Self Care | Attending: Internal Medicine

## 2016-03-08 ENCOUNTER — Observation Stay (HOSPITAL_COMMUNITY): Payer: Medicare Other

## 2016-03-08 ENCOUNTER — Encounter (HOSPITAL_COMMUNITY): Payer: Self-pay | Admitting: Cardiology

## 2016-03-08 DIAGNOSIS — I509 Heart failure, unspecified: Secondary | ICD-10-CM

## 2016-03-08 DIAGNOSIS — Z7189 Other specified counseling: Secondary | ICD-10-CM | POA: Diagnosis not present

## 2016-03-08 DIAGNOSIS — I429 Cardiomyopathy, unspecified: Secondary | ICD-10-CM | POA: Diagnosis present

## 2016-03-08 DIAGNOSIS — I272 Other secondary pulmonary hypertension: Secondary | ICD-10-CM | POA: Diagnosis present

## 2016-03-08 DIAGNOSIS — I481 Persistent atrial fibrillation: Secondary | ICD-10-CM | POA: Diagnosis not present

## 2016-03-08 DIAGNOSIS — I69354 Hemiplegia and hemiparesis following cerebral infarction affecting left non-dominant side: Secondary | ICD-10-CM | POA: Diagnosis not present

## 2016-03-08 DIAGNOSIS — E1122 Type 2 diabetes mellitus with diabetic chronic kidney disease: Secondary | ICD-10-CM | POA: Diagnosis present

## 2016-03-08 DIAGNOSIS — E43 Unspecified severe protein-calorie malnutrition: Secondary | ICD-10-CM | POA: Diagnosis present

## 2016-03-08 DIAGNOSIS — E079 Disorder of thyroid, unspecified: Secondary | ICD-10-CM | POA: Diagnosis not present

## 2016-03-08 DIAGNOSIS — E0520 Thyrotoxicosis with toxic multinodular goiter without thyrotoxic crisis or storm: Secondary | ICD-10-CM | POA: Diagnosis present

## 2016-03-08 DIAGNOSIS — Z79899 Other long term (current) drug therapy: Secondary | ICD-10-CM | POA: Diagnosis not present

## 2016-03-08 DIAGNOSIS — K5900 Constipation, unspecified: Secondary | ICD-10-CM | POA: Diagnosis present

## 2016-03-08 DIAGNOSIS — Z86711 Personal history of pulmonary embolism: Secondary | ICD-10-CM | POA: Diagnosis not present

## 2016-03-08 DIAGNOSIS — I447 Left bundle-branch block, unspecified: Secondary | ICD-10-CM | POA: Diagnosis present

## 2016-03-08 DIAGNOSIS — I5023 Acute on chronic systolic (congestive) heart failure: Secondary | ICD-10-CM | POA: Diagnosis present

## 2016-03-08 DIAGNOSIS — N183 Chronic kidney disease, stage 3 (moderate): Secondary | ICD-10-CM | POA: Diagnosis present

## 2016-03-08 DIAGNOSIS — I130 Hypertensive heart and chronic kidney disease with heart failure and stage 1 through stage 4 chronic kidney disease, or unspecified chronic kidney disease: Secondary | ICD-10-CM | POA: Diagnosis present

## 2016-03-08 DIAGNOSIS — I472 Ventricular tachycardia: Secondary | ICD-10-CM | POA: Diagnosis present

## 2016-03-08 DIAGNOSIS — Z515 Encounter for palliative care: Secondary | ICD-10-CM | POA: Diagnosis not present

## 2016-03-08 DIAGNOSIS — I4891 Unspecified atrial fibrillation: Secondary | ICD-10-CM | POA: Diagnosis present

## 2016-03-08 DIAGNOSIS — Z96653 Presence of artificial knee joint, bilateral: Secondary | ICD-10-CM | POA: Diagnosis present

## 2016-03-08 DIAGNOSIS — G40909 Epilepsy, unspecified, not intractable, without status epilepticus: Secondary | ICD-10-CM | POA: Diagnosis present

## 2016-03-08 DIAGNOSIS — E0590 Thyrotoxicosis, unspecified without thyrotoxic crisis or storm: Secondary | ICD-10-CM

## 2016-03-08 DIAGNOSIS — Z66 Do not resuscitate: Secondary | ICD-10-CM | POA: Diagnosis present

## 2016-03-08 DIAGNOSIS — K219 Gastro-esophageal reflux disease without esophagitis: Secondary | ICD-10-CM | POA: Diagnosis present

## 2016-03-08 DIAGNOSIS — N179 Acute kidney failure, unspecified: Secondary | ICD-10-CM | POA: Diagnosis present

## 2016-03-08 DIAGNOSIS — R079 Chest pain, unspecified: Secondary | ICD-10-CM | POA: Diagnosis present

## 2016-03-08 DIAGNOSIS — Z8669 Personal history of other diseases of the nervous system and sense organs: Secondary | ICD-10-CM | POA: Diagnosis not present

## 2016-03-08 DIAGNOSIS — Z7901 Long term (current) use of anticoagulants: Secondary | ICD-10-CM | POA: Diagnosis not present

## 2016-03-08 DIAGNOSIS — E785 Hyperlipidemia, unspecified: Secondary | ICD-10-CM | POA: Diagnosis present

## 2016-03-08 DIAGNOSIS — J449 Chronic obstructive pulmonary disease, unspecified: Secondary | ICD-10-CM | POA: Diagnosis present

## 2016-03-08 DIAGNOSIS — F411 Generalized anxiety disorder: Secondary | ICD-10-CM | POA: Diagnosis present

## 2016-03-08 HISTORY — PX: CARDIAC CATHETERIZATION: SHX172

## 2016-03-08 LAB — POCT I-STAT 3, VENOUS BLOOD GAS (G3P V)
ACID-BASE EXCESS: 2 mmol/L (ref 0.0–2.0)
Acid-Base Excess: 3 mmol/L — ABNORMAL HIGH (ref 0.0–2.0)
BICARBONATE: 28.6 meq/L — AB (ref 20.0–24.0)
Bicarbonate: 29.2 mEq/L — ABNORMAL HIGH (ref 20.0–24.0)
O2 SAT: 70 %
O2 Saturation: 68 %
PCO2 VEN: 47.6 mmHg (ref 45.0–50.0)
PCO2 VEN: 48.2 mmHg (ref 45.0–50.0)
PH VEN: 7.381 — AB (ref 7.250–7.300)
PH VEN: 7.395 — AB (ref 7.250–7.300)
PO2 VEN: 37 mmHg (ref 31.0–45.0)
PO2 VEN: 38 mmHg (ref 31.0–45.0)
TCO2: 30 mmol/L (ref 0–100)
TCO2: 31 mmol/L (ref 0–100)

## 2016-03-08 LAB — CBC
HCT: 45.8 % (ref 39.0–52.0)
Hemoglobin: 15.3 g/dL (ref 13.0–17.0)
MCH: 30.6 pg (ref 26.0–34.0)
MCHC: 33.4 g/dL (ref 30.0–36.0)
MCV: 91.6 fL (ref 78.0–100.0)
PLATELETS: 281 10*3/uL (ref 150–400)
RBC: 5 MIL/uL (ref 4.22–5.81)
RDW: 13.1 % (ref 11.5–15.5)
WBC: 3.9 10*3/uL — ABNORMAL LOW (ref 4.0–10.5)

## 2016-03-08 LAB — CULTURE, BLOOD (ROUTINE X 2)
Culture: NO GROWTH
Culture: NO GROWTH

## 2016-03-08 LAB — BASIC METABOLIC PANEL
Anion gap: 10 (ref 5–15)
BUN: 39 mg/dL — AB (ref 6–20)
CHLORIDE: 94 mmol/L — AB (ref 101–111)
CO2: 27 mmol/L (ref 22–32)
CREATININE: 1.56 mg/dL — AB (ref 0.61–1.24)
Calcium: 10.4 mg/dL — ABNORMAL HIGH (ref 8.9–10.3)
GFR calc Af Amer: 51 mL/min — ABNORMAL LOW (ref >60)
GFR calc non Af Amer: 44 mL/min — ABNORMAL LOW (ref >60)
Glucose, Bld: 96 mg/dL (ref 65–99)
Potassium: 4.6 mmol/L (ref 3.5–5.1)
Sodium: 131 mmol/L — ABNORMAL LOW (ref 135–145)

## 2016-03-08 LAB — PROTIME-INR
INR: 3.02 — ABNORMAL HIGH (ref 0.00–1.49)
Prothrombin Time: 30.7 seconds — ABNORMAL HIGH (ref 11.6–15.2)

## 2016-03-08 LAB — T3, FREE: T3, Free: 9.9 pg/mL — ABNORMAL HIGH (ref 2.0–4.4)

## 2016-03-08 SURGERY — RIGHT HEART CATH

## 2016-03-08 MED ORDER — SODIUM CHLORIDE 0.9 % IV SOLN: 250.0000 mL | INTRAVENOUS | Status: DC | PRN

## 2016-03-08 MED ORDER — LISINOPRIL 2.5 MG PO TABS
2.5000 mg | ORAL_TABLET | Freq: Every day | ORAL | Status: DC
  Administered 2016-03-09 – 2016-03-10 (×2): 2.5 mg via ORAL
  Filled 2016-03-08 (×2): qty 1

## 2016-03-08 MED ORDER — SENNOSIDES-DOCUSATE SODIUM 8.6-50 MG PO TABS
2.0000 | ORAL_TABLET | Freq: Two times a day (BID) | ORAL | Status: DC
  Administered 2016-03-08 – 2016-03-09 (×3): 2 via ORAL
  Filled 2016-03-08 (×5): qty 2

## 2016-03-08 MED ORDER — HEPARIN (PORCINE) IN NACL 2-0.9 UNIT/ML-% IJ SOLN
INTRAMUSCULAR | Status: AC
  Filled 2016-03-08: qty 500

## 2016-03-08 MED ORDER — METHIMAZOLE 10 MG PO TABS
40.0000 mg | ORAL_TABLET | Freq: Every day | ORAL | Status: DC
  Administered 2016-03-08: 40 mg via ORAL
  Filled 2016-03-08: qty 4

## 2016-03-08 MED ORDER — MIDAZOLAM HCL 2 MG/2ML IJ SOLN
INTRAMUSCULAR | Status: DC | PRN
  Administered 2016-03-08 (×2): 0.5 mg via INTRAVENOUS

## 2016-03-08 MED ORDER — FENTANYL CITRATE (PF) 100 MCG/2ML IJ SOLN
INTRAMUSCULAR | Status: DC | PRN
  Administered 2016-03-08: 25 ug via INTRAVENOUS

## 2016-03-08 MED ORDER — METHIMAZOLE 10 MG PO TABS
40.0000 mg | ORAL_TABLET | Freq: Every day | ORAL | Status: DC
  Administered 2016-03-08 – 2016-03-09 (×3): 40 mg via ORAL
  Filled 2016-03-08: qty 4

## 2016-03-08 MED ORDER — HEPARIN (PORCINE) IN NACL 2-0.9 UNIT/ML-% IJ SOLN
INTRAMUSCULAR | Status: DC | PRN
  Administered 2016-03-08: 500 mL

## 2016-03-08 MED ORDER — SODIUM CHLORIDE 0.9 % IV SOLN
INTRAVENOUS | Status: DC | PRN
  Administered 2016-03-08: 75 mL/h via INTRAVENOUS

## 2016-03-08 MED ORDER — MIDAZOLAM HCL 2 MG/2ML IJ SOLN
INTRAMUSCULAR | Status: AC
  Filled 2016-03-08: qty 2

## 2016-03-08 MED ORDER — SODIUM CHLORIDE 0.9% FLUSH
3.0000 mL | Freq: Two times a day (BID) | INTRAVENOUS | Status: DC
  Administered 2016-03-08 – 2016-03-10 (×5): 3 mL via INTRAVENOUS

## 2016-03-08 MED ORDER — FENTANYL CITRATE (PF) 100 MCG/2ML IJ SOLN
INTRAMUSCULAR | Status: AC
  Filled 2016-03-08: qty 2

## 2016-03-08 MED ORDER — WARFARIN SODIUM 2.5 MG PO TABS
2.5000 mg | ORAL_TABLET | Freq: Once | ORAL | Status: AC
  Administered 2016-03-08: 2.5 mg via ORAL
  Filled 2016-03-08: qty 1

## 2016-03-08 MED ORDER — SODIUM CHLORIDE 0.9% FLUSH: 3.0000 mL | INTRAVENOUS | Status: DC | PRN

## 2016-03-08 MED ORDER — ACETAMINOPHEN 325 MG PO TABS: 650.0000 mg | ORAL_TABLET | Freq: Four times a day (QID) | ORAL | Status: DC | PRN

## 2016-03-08 MED ORDER — ONDANSETRON HCL 4 MG/2ML IJ SOLN: 4.0000 mg | Freq: Four times a day (QID) | INTRAMUSCULAR | Status: DC | PRN

## 2016-03-08 MED ORDER — SODIUM CHLORIDE 0.9% FLUSH
3.0000 mL | Freq: Two times a day (BID) | INTRAVENOUS | Status: DC
  Administered 2016-03-09 – 2016-03-10 (×2): 3 mL via INTRAVENOUS

## 2016-03-08 MED ORDER — GLYCERIN (LAXATIVE) 2.1 G RE SUPP
1.0000 | Freq: Once | RECTAL | Status: DC
  Filled 2016-03-08 (×2): qty 1

## 2016-03-08 MED ORDER — POLYETHYLENE GLYCOL 3350 17 G PO PACK
17.0000 g | PACK | Freq: Every day | ORAL | Status: DC
  Administered 2016-03-08: 17 g via ORAL
  Filled 2016-03-08 (×3): qty 1

## 2016-03-08 MED ORDER — LIDOCAINE HCL (PF) 1 % IJ SOLN
INTRAMUSCULAR | Status: AC
  Filled 2016-03-08: qty 30

## 2016-03-08 MED ORDER — ENSURE ENLIVE PO LIQD
237.0000 mL | Freq: Two times a day (BID) | ORAL | Status: DC
  Administered 2016-03-08 – 2016-03-09 (×2): 237 mL via ORAL

## 2016-03-08 MED ORDER — SODIUM CHLORIDE 0.9 % IV SOLN
INTRAVENOUS | Status: DC
  Administered 2016-03-08: 15:00:00 via INTRAVENOUS

## 2016-03-08 MED ORDER — ACETAMINOPHEN 325 MG PO TABS
650.0000 mg | ORAL_TABLET | ORAL | Status: DC | PRN
  Administered 2016-03-10: 650 mg via ORAL
  Filled 2016-03-08: qty 2

## 2016-03-08 SURGICAL SUPPLY — 7 items
CATH BALLN WEDGE 5F 110CM (CATHETERS) ×3 IMPLANT
GUIDEWIRE .025 260CM (WIRE) ×3 IMPLANT
KIT HEART LEFT (KITS) ×3 IMPLANT
KIT HEART RIGHT NAMIC (KITS) ×3 IMPLANT
PACK CARDIAC CATHETERIZATION (CUSTOM PROCEDURE TRAY) ×3 IMPLANT
SHEATH FAST CATH BRACH 5F 5CM (SHEATH) ×3 IMPLANT
TRANSDUCER W/STOPCOCK (MISCELLANEOUS) ×3 IMPLANT

## 2016-03-08 NOTE — Progress Notes (Signed)
   03/08/16 1600  Clinical Encounter Type  Visited With Patient not available  Visit Type Follow-up  Referral From Chaplain  Consult/Referral To Chaplain  Spiritual Encounters  Spiritual Needs Emotional  [facility] followed up after palliative visit.  Patient not available. Johnathan Morrison 03/08/16

## 2016-03-08 NOTE — Progress Notes (Signed)
PROGRESS NOTE    Kamen Hanken  ZOX:[id number] DOB: [birth year] DOA: 03/05/2016 PCP: [facility]    Brief Narrative:  Johnathan Morrison is a 70 y.o. 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   Assessment & Plan:   Principal Problem:   Chest pain Active Problems:   A-fib (HCC)   Chronic anticoagulation   HTN (hypertension)   History of seizure   Chronic renal disease, stage III   COPD (chronic obstructive pulmonary disease) (HCC)   NSVT (nonsustained ventricular tachycardia) (HCC)   Acute on chronic systolic congestive heart failure (HCC)   Goals of care, counseling/discussion   Palliative care encounter   Loss of weight   Protein-calorie malnutrition, severe   Severe protein-energy malnutrition (HCC)   Left sided chest pain over the ICD, tender to touch:  Atypical chest pain. Relieved with morphine.  EKG unchanged.  Mildly elevated troponins.      Chronic systolic heart failure: He appears to be compensated.  Resume torsemide, spironolactone, lisinopril and coreg.  Underwent RHC today, showing low filling pressures with cardiac output.  No plans to start milrinone.    Non ischemic cardiomyopathy: Resume lipitor.   Atrial fibrillation: Rate controlled.  Resume coumadin as per pharmacy.  Supra therapeutic INR.  On coreg and digoxin.  Mexiletine 150 mg three times a day started.    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 Does not appear to be in thyrotoxicosis. ? Not sure if he needs prednisone in addition to methimazole.  Low TSH, elevated free t4 and free t3.  Unknown if its type 1 or type 2.  Ordered US thyroid to see if any nodules/ goiter. Would recommend endocrinology consult on discharge.  Started him on methimazole 40 mg daily. First dose given at 2 pm today.    Check thyroid panel in 6 weeks.   Seizures: No seizure activity .  Resume  Keppra.   Hyperlipidemia: Resume lipitor.    COPD; No wheezing, resume bronchodilators as needed.    Hypertension: low bp readings this am and his meds were held.  His bp parameters have improved.   Stage 3 CKD: stable.   In view of medical conditions, and his desire not to pursue life extending interventions, we have requested palliative care consultation and goc meeting with the patient and family, . Further recommendations from palliative to follow.    DVT prophylaxis: (coumadin) Code Status: DNR Family Communication: none at bedside, . Disposition Plan: (pending PT eval, would benefit from snf, but palliative is also following,  He wants to go home with home health pt.    Consultants:   Cardiology  Palliative care.    Procedures:   Cardiac catheterization   Antimicrobials: none   Subjective: Constipated. No chest pain or sob.  Objective: Vitals:   03/08/16 1423 03/08/16 1438 03/08/16 1508 03/08/16 1538  BP: (!) 131/98 109/70 103/70 102/68  Pulse: 77 80 79 80  Resp:  (!) 22    Temp:   97.6 F (36.4 C) 97.6 F (36.4 C)  TempSrc:      SpO2: 98% 99%    Weight:      Height:        Intake/Output Summary (Last 24 hours) at 03/08/16 1706 Last data filed at 03/08/16 1600  Gross per 24 hour  Intake  200 ml  Output             1250 ml  Net            -1050 ml   Filed Weights   03/06/16 0002 03/07/16 1004 03/08/16 0454  Weight: 79.9 kg (176 lb 1.6 oz) 78.2 kg (172 lb 6.4 oz) 78.1 kg (172 lb 1.6 oz)    Examination:  General exam: anxious . But denies any chest pain or sob today.  Respiratory system: Clear to auscultation. Respiratory effort normal. Cardiovascular system: S1 & S2 heard, no murmers.  Gastrointestinal system: Abdomen is nondistended, soft and nontender. No organomegaly or masses felt. Normal bowel sounds heard. Central nervous system: Alert and  oriented. No focal neurological deficits. Extremities: Symmetric 5 x 5 power. Skin: No rashes, lesions or ulcers     Data Reviewed: I have personally reviewed following labs and imaging studies  CBC:  Recent Labs Lab 03/03/16 1030 03/05/16 1942 03/05/16 1947 03/06/16 0318 03/07/16 1557 03/08/16 1118  WBC 4.3 3.8*  --  3.5* 4.2 3.9*  NEUTROABS 2.4  --   --   --   --   --   HGB 15.0 14.9 16.3 14.1 15.2 15.3  HCT 45.0 44.7 48.0 43.1 45.5 45.8  MCV 91.8 92.2  --  92.3 91.9 91.6  PLT 259 279  --  249 283 281   Basic Metabolic Panel:  Recent Labs Lab 03/03/16 1030 03/05/16 1942 03/05/16 1947 03/06/16 0318 03/07/16 1557 03/08/16 1118  NA 128* 130* 133* 132* 130* 131*  K 4.7 5.1 5.3* 4.8 4.5 4.6  CL 94* 98* 97* 102 93* 94*  CO2 22 24  --  23 25 27   GLUCOSE 92 104* 103* 89 97 96  BUN 68* 36* 40* 31* 33* 39*  CREATININE 2.68* 1.40* 1.40* 1.18 1.41* 1.56*  CALCIUM 9.5 10.0  --  9.8 10.3 10.4*   GFR: Estimated Creatinine Clearance: 49.7 mL/min (by C-G formula based on SCr of 1.56 mg/dL). Liver Function Tests:  Recent Labs Lab 03/03/16 1030 03/06/16 0318  AST 32 30  ALT 27 24  ALKPHOS 77 63  BILITOT 1.6* 1.1  PROT 9.1* 8.1  ALBUMIN 4.2 3.6   No results for input(s): LIPASE, AMYLASE in the last 168 hours. No results for input(s): AMMONIA in the last 168 hours. Coagulation Profile:  Recent Labs Lab 03/05/16 1942 03/06/16 0318 03/07/16 0312 03/07/16 1557 03/08/16 0340  INR 3.74* 3.97* 3.81* 3.20* 3.02*   Cardiac Enzymes:  Recent Labs Lab 03/03/16 1030 03/06/16 0012 03/06/16 0318 03/06/16 1001  TROPONINI 0.03* 0.04* 0.04* 0.04*   BNP (last 3 results) No results for input(s): PROBNP in the last 8760 hours. HbA1C: No results for input(s): HGBA1C in the last 72 hours. CBG: No results for input(s): GLUCAP in the last 168 hours. Lipid Profile: No results for input(s): CHOL, HDL, LDLCALC, TRIG, CHOLHDL, LDLDIRECT in the last 72 hours. Thyroid  Function Tests:  Recent Labs  03/07/16 1010  TSH 0.027*  FREET4 5.38*  T3FREE 9.9*   Anemia Panel: No results for input(s): VITAMINB12, FOLATE, FERRITIN, TIBC, IRON, RETICCTPCT in the last 72 hours. Sepsis Labs:  Recent Labs Lab 03/03/16 1146  LATICACIDVEN 1.65    Recent Results (from the past 240 hour(s))  Culture, blood (routine x 2)     Status: None   Collection Time: 03/03/16 12:15 PM  Result Value Ref Range Status   Specimen Description BLOOD RIGHT ANTECUBITAL  Final   Special Requests BOTTLES DRAWN  AEROBIC AND ANAEROBIC 5CC EACH  Final   Culture NO GROWTH 5 DAYS  Final   Report Status 03/08/2016 FINAL  Final  Culture, blood (routine x 2)     Status: None   Collection Time: 03/03/16 12:25 PM  Result Value Ref Range Status   Specimen Description BLOOD RIGHT FOREARM  Final   Special Requests BOTTLES DRAWN AEROBIC AND ANAEROBIC 5CC EACH  Final   Culture NO GROWTH 5 DAYS  Final   Report Status 03/08/2016 FINAL  Final         Radiology Studies: US Thyroid  Result Date: 03/08/2016 CLINICAL DATA:  Hyperthyroidism. EXAM: THYROID ULTRASOUND TECHNIQUE: Ultrasound examination of the thyroid gland and adjacent soft tissues was performed. COMPARISON:  Chest CT 02/12/2016 FINDINGS: Right thyroid lobe Measurements: 4.3 x 2.4 x 1.3 cm. Hypoechoic nodule with a small echogenic focus in the superior right thyroid lobe measuring up to 0.3 cm. Right thyroid tissue is mildly heterogeneous. Left thyroid lobe Measurements: 3.6 x 2.7 x 1.3 cm. Left thyroid tissue is mildly heterogeneous without a discrete nodule. Isthmus Thickness: 0.4 cm.  No nodules visualized. Lymphadenopathy None visualized. IMPRESSION: Single small right thyroid nodule measuring up to 0.3 cm. Findings do not meet current SRU consensus criteria for biopsy. Follow-up by clinical exam is recommended. If patient has known risk factors for thyroid carcinoma, consider follow-up ultrasound in 12 months. If patient is clinically  hyperthyroid, consider nuclear medicine thyroid uptake and scan.Reference: Management of Thyroid Nodules Detected at [place]: Society of Radiologists in Ultrasound Consensus Conference Statement. Radiology 2005; [id number]. Electronically Signed   By: Richarda Overlie M.D.   On: 03/08/2016 16:48       Scheduled Meds: . atorvastatin  40 mg Oral Daily  . carvedilol  6.25 mg Oral BID WC  . digoxin  125 mcg Oral QODAY  . feeding supplement (ENSURE ENLIVE)  237 mL Oral BID BM  . Glycerin (Adult)  1 suppository Rectal Once  . hydrALAZINE  25 mg Oral Q8H  . isosorbide mononitrate  30 mg Oral Daily  . levETIRAcetam  500 mg Oral Daily  . [START ON 03/09/2016] lisinopril  2.5 mg Oral Daily  . mexiletine  150 mg Oral Q8H  . pantoprazole  40 mg Oral Daily  . polyethylene glycol  17 g Oral Daily  . senna-docusate  2 tablet Oral BID  . sodium chloride flush  3 mL Intravenous Q12H  . sodium chloride flush  3 mL Intravenous Q12H  . spironolactone  12.5 mg Oral Daily  . torsemide  10 mg Oral Daily  . warfarin  2.5 mg Oral ONCE-1800  . Warfarin - Pharmacist Dosing Inpatient   Does not apply q1800   Continuous Infusions: . sodium chloride 75 mL/hr at 03/08/16 1518     LOS: 0 days    Time spent: 25 minutes.     Kathlen Mody, MD [facility] Pager [phone]  If 7PM-7AM, please contact night-coverage [email or web address] Password [facility] 03/08/2016, 5:06 PM

## 2016-03-08 NOTE — Progress Notes (Signed)
Initial Nutrition Assessment  DOCUMENTATION CODES:   Severe malnutrition in context of acute illness/injury  INTERVENTION:  Provide Ensure Enlive po BID, each supplement provides 350 kcal and 20 grams of protein Encourage PO intake   NUTRITION DIAGNOSIS:   Malnutrition related to acute illness as evidenced by percent weight loss, energy intake < or equal to 50% for > or equal to 5 days, moderate depletions of muscle mass.   GOAL:   Patient will meet greater than or equal to 90% of their needs   MONITOR:   Diet advancement, Supplement acceptance, PO intake, Skin, I & O's, Labs  REASON FOR ASSESSMENT:   Consult Assessment of nutrition requirement/status  ASSESSMENT:   70 y.o. 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  Pt states that for the past 2 weeks he has had no appetite and he has been eating little to nothing. He states that he usually weighs 190 lbs and eats 3 meals daily. He reports losing from 190 lbs to 172 lbs in the past 2 weeks-8.5% weight loss. He states that he started drinking Ensure twice daily 2 weeks ago. Pt has some moderate muscle wasting per nutrition-focused physical exam. RD encouraged intake of Ensure supplements up to 4 times daily if he is not eating. Pt NPO for procedure today.   Labs: low sodium, low chloride, high INR  Diet Order:  Diet Heart Room service appropriate? Yes; Fluid consistency: Thin  Skin:  Reviewed, no issues  Last BM:  unknown  Height:   Ht Readings from Last 1 Encounters:  03/06/16 6' (1.829 m)    Weight:   Wt Readings from Last 1 Encounters:  03/08/16 172 lb 1.6 oz (78.1 kg)    Ideal Body Weight:  80.9 kg  BMI:  Body mass index is 23.34 kg/m.  Estimated Nutritional Needs:   Kcal:  2000-2200  Protein:  100-120 grams  Fluid:  2  L/day  EDUCATION NEEDS:   No education needs identified at this time  Dorothea Ogle RD, LDN Inpatient Clinical Dietitian Pager: [phone] After Hours Pager: [phone]

## 2016-03-08 NOTE — Progress Notes (Signed)
PT Cancellation Note  Patient Details Name: Johnathan Morrison MRN: [id number] DOB: [birth year]   Cancelled Treatment:    Reason Eval/Treat Not Completed: Patient at procedure or test/unavailable (per RN pt about to leave for ultrasound).  Will attempt to see pt tomorrow, 03/09/16.  Encarnacion Chu PT, DPT  Pager: [phone] Phone: [phone] 03/08/2016, 3:56 PM

## 2016-03-08 NOTE — Care Management Note (Signed)
Case Management Note  Patient Details  Name: Johnathan Morrison MRN: [id number] Date of Birth: 08/23/1945  Subjective/Objective:      Admitted with Chest Pain              Action/Plan: Patient recently admitted with a qualifying 3 day hospital stay and plans to go to a skilled nursing facility for rehab at discharge. Lauretta Grill Worker made aware;  Expected Discharge Date:   possibly 03/09/2016               Expected Discharge Plan:   SNF  In-House Referral:   Soc Worker    Status of Service: In progress    [name] [phone] 03/08/2016, 2:55 PM

## 2016-03-08 NOTE — Care Management Obs Status (Signed)
MEDICARE OBSERVATION STATUS NOTIFICATION   Patient Details  Name: Aadhi Smyser MRN: [id number] Date of Birth: [birth year]   Medicare Observation Status Notification Given:  Yes    Cherrie Distance, RN 03/08/2016, 2:54 PM

## 2016-03-08 NOTE — Progress Notes (Signed)
Advanced Heart Failure Rounding Note   Subjective:    Admitted with chest pain, dyspnea, and poor appetite.   On July 20th he presented to the ED with increased dyspnea. Creatinine elevated to 2.68.  Given IV fluids and torsemide, dig, spiro, and potassium stopped. Later discharged. Currently, creatinine back down to 1.18.   Complaining of fatigue and dyspnea on exertion. Poor appetite.   RHC 02/25/16 RA mean 9 RV 39/10 PA 36/12, mean 24 PCWP mean 13 LV 79/18 AO 82/56 Oxygen saturations: PA 54% AO 96% Cardiac Output (Fick) 3.94  Cardiac Index (Fick) 1.89    Objective:   Weight Range:  Vital Signs:   Temp:  [98.5 F (36.9 C)-98.6 F (37 C)] 98.5 F (36.9 C) (07/25 0454) Pulse Rate:  [79-81] 79 (07/25 0852) Resp:  [16-18] 16 (07/25 0454) BP: (90-99)/(52-64) 92/64 (07/25 0852) SpO2:  [95 %-100 %] 100 % (07/25 0454) Weight:  [172 lb 1.6 oz (78.1 kg)-172 lb 6.4 oz (78.2 kg)] 172 lb 1.6 oz (78.1 kg) (07/25 0454) Last BM Date: 03/03/16  Weight change: Filed Weights   03/06/16 0002 03/07/16 1004 03/08/16 0454  Weight: 176 lb 1.6 oz (79.9 kg) 172 lb 6.4 oz (78.2 kg) 172 lb 1.6 oz (78.1 kg)    Intake/Output:   Intake/Output Summary (Last 24 hours) at 03/08/16 0920 Last data filed at 03/08/16 0848  Gross per 24 hour  Intake              440 ml  Output             1000 ml  Net             -560 ml     Physical Exam: General:  Fatigued appearing. No resp difficulty. In bed  HEENT: normal Neck: supple. JVP 5-6 . Carotids 2+ bilat; no bruits. No lymphadenopathy or thryomegaly appreciated. Cor: PMI nondisplaced. Regular rate & rhythm. No rubs, or murmurs. + S3  Lungs: clear Abdomen: soft, nontender, nondistended. No hepatosplenomegaly. No bruits or masses. Good bowel sounds. Extremities: no cyanosis, clubbing, rash, edema Neuro: alert & orientedx3, cranial nerves grossly intact. moves all 4 extremities w/o difficulty. Affect pleasant  Telemetry:  SR 80s    Labs: Basic Metabolic Panel:  Recent Labs Lab 03/03/16 1030 03/05/16 1942 03/05/16 1947 03/06/16 0318 03/07/16 1557  NA 128* 130* 133* 132* 130*  K 4.7 5.1 5.3* 4.8 4.5  CL 94* 98* 97* 102 93*  CO2 22 24  --  23 25  GLUCOSE 92 104* 103* 89 97  BUN 68* 36* 40* 31* 33*  CREATININE 2.68* 1.40* 1.40* 1.18 1.41*  CALCIUM 9.5 10.0  --  9.8 10.3    Liver Function Tests:  Recent Labs Lab 03/03/16 1030 03/06/16 0318  AST 32 30  ALT 27 24  ALKPHOS 77 63  BILITOT 1.6* 1.1  PROT 9.1* 8.1  ALBUMIN 4.2 3.6   No results for input(s): LIPASE, AMYLASE in the last 168 hours. No results for input(s): AMMONIA in the last 168 hours.  CBC:  Recent Labs Lab 03/03/16 1030 03/05/16 1942 03/05/16 1947 03/06/16 0318 03/07/16 1557  WBC 4.3 3.8*  --  3.5* 4.2  NEUTROABS 2.4  --   --   --   --   HGB 15.0 14.9 16.3 14.1 15.2  HCT 45.0 44.7 48.0 43.1 45.5  MCV 91.8 92.2  --  92.3 91.9  PLT 259 279  --  249 283    Cardiac Enzymes:  Recent  Labs Lab 03/01/16 1010 03/03/16 1030 03/06/16 0012 03/06/16 0318 03/06/16 1001  TROPONINI 0.04* 0.03* 0.04* 0.04* 0.04*    BNP: BNP (last 3 results)  Recent Labs  02/23/16 0836 03/03/16 1030 03/05/16 2229  BNP 2,021.0* 115.0* 759.5*    ProBNP (last 3 results) No results for input(s): PROBNP in the last 8760 hours.    Other results:  Imaging: No results found.   Medications:     Scheduled Medications: . atorvastatin  40 mg Oral Daily  . carvedilol  6.25 mg Oral BID WC  . digoxin  125 mcg Oral QODAY  . hydrALAZINE  25 mg Oral Q8H  . isosorbide mononitrate  30 mg Oral Daily  . levETIRAcetam  500 mg Oral Daily  . lisinopril  2.5 mg Oral BID  . mexiletine  150 mg Oral Q8H  . pantoprazole  40 mg Oral Daily  . sodium chloride flush  3 mL Intravenous Q12H  . spironolactone  12.5 mg Oral Daily  . torsemide  10 mg Oral Daily  . Warfarin - Pharmacist Dosing Inpatient   Does not apply q1800    Infusions: . 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 Also had LHC/RHC earlier this month with nonobstructive CAD, mildly elevated RV filling, and low output.   1. Chest Pain: Troponin 0.04>0.04>0.04. Suspect demand ischemia.  It think that his chest pain was muscular.  2. Acute/Chronic Systolic Heart Failure: St Jude CRT-D device, not set to BiV pace b/c ineffective.  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 NYHA IIIB. He does not appear volume overloaded. - Hold torsemide for now.  - Continue current dose of carvedilol for now. Continue lisinopril 2.5 mg daily.  Continue hydralazine 25 mg tid and imdur 30 mg daily.  Continue spironolactone 12.5. - Digoxin cut back to every other day.  - Repeat RHC today. May need milrinone.  3. H/O VT- VT shock 7/11. Has been amio 200 mg twice a day. Thyroid function abnormal suggesting hyperthyroidism. Stop amio.   Consult EP for alternatives for amio => mexiletine.  4. H/O PAF:  On coumadin. INR 3.8 Pharmacy dosing coumadin  5. AKI: On  7/20 creatinine 2.68, though due to over-diuresis.  Creatinine now down to 1.18. Check renal function now.  6. H/O PE: On coumadin.  7. Hyperthyroidism: TSH 0.027 Free T3 Free 9.9  T4 5.3  Suspect iodine-triggered from amiodarone use.  Off amio.  EP consulted with recommendations for mexiletine 150 mg three times a day.   8. DNR- Palliative Care following for goals of care.   Briefly discussed advanced therapies. He is not sure he would like to  pursue. He is asking that Dr Shirlee Latch discuss with his daughter after his cath today. Will ask [facility] coordinators to see him today after cath.   Length of Stay: 0  Amy Clegg NP-C  03/08/2016, 9:20 AM  Advanced Heart Failure Team Pager [phone] (M-F; 7a - 4p)  Please contact [facility] for night-coverage after hours (4p -7a ) and weekends on [email or web address]  Patient seen with NP,  agree with the above note.    He does not appear volume overloaded, torsemide on hold.  If creatinine rises again, may need to stop low dose lisinopril.  He will have RHC today to assess cardiac output and filling pressures, ?low output.  If low output, should be considered for LVAD.   He has hyperthyroidism, free T4/free T3 both high.  Would recommend methimazole, per primary team.  We stopped amiodarone and put him on mexiletine instead.    [name] 03/08/2016 12:58 PM

## 2016-03-08 NOTE — Progress Notes (Signed)
   03/08/16 1600  Clinical Encounter Type  Visited With Patient not available  Visit Type Follow-up  Referral From Chaplain  Consult/Referral To Chaplain  Spiritual Encounters  Spiritual Needs Emotional  CHP followed up after palliative care.  Patient not available. CHP av

## 2016-03-08 NOTE — Clinical Social Work Note (Signed)
CSW acknowledges consult regarding concerns for patient living alone. Patient is currently under Medicare observation but recently had over a 3-day inpatient stay so he can qualify for SNF as long as PT recommends. Per notes, patient does not want to go to a nursing home. PT eval ordered. Will discuss with patient once recommendations are available.  Charlynn Court, CSW [phone]

## 2016-03-08 NOTE — Progress Notes (Signed)
ANTICOAGULATION CONSULT NOTE Pharmacy Consult for Coumadin Indication: atrial fibrillation  No Known Allergies  Patient Measurements: Height: 6' (182.9 cm) Weight: 172 lb 1.6 oz (78.1 kg) (scale b) IBW/kg (Calculated) : 77.6  Vital Signs: Temp: 98.5 F (36.9 C) (07/25 0454) Temp Source: Oral (07/25 0454) BP: 98/67 (07/25 1015) Pulse Rate: 79 (07/25 1015)  Labs:  Recent Labs  03/05/16 1942 03/05/16 1947 03/06/16 0012 03/06/16 0318 03/06/16 1001 03/07/16 0312 03/07/16 1557 03/08/16 0340  HGB 14.9 16.3  --  14.1  --   --  15.2  --   HCT 44.7 48.0  --  43.1  --   --  45.5  --   PLT 279  --   --  249  --   --  283  --   LABPROT 36.1*  --   --  37.8*  --  36.7* 32.1* 30.7*  INR 3.74*  --   --  3.97*  --  3.81* 3.20* 3.02*  CREATININE 1.40* 1.40*  --  1.18  --   --  1.41*  --   TROPONINI  --   --  0.04* 0.04* 0.04*  --   --   --     Estimated Creatinine Clearance: 55 mL/min (by C-G formula based on SCr of 1.41 mg/dL).  Assessment: 70 y.o. male admitted with chest pain, h/o Afib, to continue Coumadin.  INR trending down to 3.02 today.  No Coumadin given since admission.  Going for RHC today.  No bleeding or complications noted.  PTA Coumadin dose 5 mg daily, but INR 3.7 at admission.  Goal of Therapy:  INR 2-3 Monitor platelets by anticoagulation protocol: Yes   Plan:  Will resume Coumadin tonight with 2.5 mg x 1. Daily INR  Reece Leader, Loura Back, BCPS  Clinical Pharmacist Pager [phone]  03/08/2016 10:48 AM

## 2016-03-08 NOTE — Progress Notes (Signed)
Heart Failure Navigator Consult Note  Presentation: Johnathan Morrison presented withis a 70 y.o. 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 He makes note that he previously was on oxygen and this was taken away, but he feels that he needs it at home again. Patient notes that since his last hospitalization that there were over 8 medication changes that were made. He feels uncomfortable although he is been taking medications as advised. He notes that he went from around 190 pounds down 174 and he thinks that that is too much weight to lose. Associated symptoms include decreased appetite due to what the patient reports as "everything tastes nasty", nausea, dizziness. He denies having any loss of consciousness, vomiting, diarrhea, blood in stool/urine, lower extremity swelling, chest pain, or shortness of breath since oxygen has been placed. Review of records shows the patient which was last admitted into the hospital at [facility] transferred down to [facility] from 7/11-7/18 for complaints of shortness of breath. Patient was found to have multiple episodes of VT which he was shocked on 7/11. Seen by EP and started back on amiodarone, underwent cardiac cath 7/13 by Dr. Freida Busman which showed nonobstructive cardiomyopathy with EF of 15-20%, required milrinone initially for low output heart failure(but was able to be weaned off prior to discharge), and was discharged in fairly stable condition. Patient was seen in the ED 2 days ago with similar complaints of shortness of breath. Family was noted as thinking that the patient was discharged too early and possibly need placement as he currently lives alone and has not been doing well since being discharged home.   Past Medical History:  Diagnosis Date  . Atrial fibrillation (HCC)   . Cardiomyopathy  (HCC)   . Chronic renal disease, stage III   . Chronic systolic heart failure (HCC)   . COPD (chronic obstructive pulmonary disease) (HCC)   . 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 Diastolic dysfunction    Grade 3  . Essential hypertension   . PE (pulmonary embolism) March 2016  . Pulmonary HTN (HCC)    Rt heart cath June 2016  . SAH (subarachnoid hemorrhage) (HCC) Dec 2015  . Seizures [facility])     Social History   Social History  . Marital status: Single    Spouse name: N/A  . Number of children: N/A  . Years of education: N/A   Social History Main Topics  . Smoking status: Former Games developer  . Smokeless tobacco: None     Comment: smoked 1.5 ppd x 30 years, quit in 2012)  . Alcohol use No  . Drug use: No  . Sexual activity: Not Asked   Other Topics Concern  . None   Social History Narrative  . None    ECHO:Study Conclusions-02/23/16 - Left ventricle: The cavity size was moderately dilated. Wall   thickness was normal. Systolic function was severely reduced. The   estimated ejection fraction was in the range of 15% to 20%.   Diffuse hypokinesis. There is akinesis of the   mid-apicalinferolateral and inferior myocardium. Doppler   parameters are consistent with restrictive physiology, indicative   of decreased left ventricular diastolic compliance and/or   increased left atrial pressure. - Aortic valve: Mildly calcified annulus. Trileaflet; mildly   calcified leaflets. There was trivial regurgitation. - Mitral valve: There was moderate  regurgitation. - Left atrium: The atrium was moderately to severely dilated. - Right ventricle: The cavity size was moderately dilated. Pacer   wire or catheter noted in right ventricle. Systolic function was   moderately reduced. - Right atrium: The atrium was moderately to severely dilated.   Central venous pressure (est): 15 mm Hg. - Tricuspid valve: There was moderate regurgitation. - Pulmonic valve: There was mild  regurgitation. - Pulmonary arteries: PA peak pressure: 57 mm Hg (S). - Pericardium, extracardiac: There was no pericardial effusion.  Impressions:  - Moderate LV chamber dilatation with overall normal wall thickness   and LVEF approximately 15-20%. There is severe diffuse   hypokinesis with regional variation and relative akinesis of the   mid to apical inferior and inferolateral wall. Restrictive   diastolic filling pattern noted with evidence of increased LV   filling pressure. Moderate to severe biatrial enlargement.   Moderate mitral regurgitation. Trivial aortic regurgitation.   Moderate RV dilatation with reduced contraction, device wire   present within the right heart. Moderate tricuspid regurgitation   with PASP estimated 57 mmHg.  BNP    Component Value Date/Time   BNP 759.5 (H) 03/05/2016 2229    ProBNP No results found for: PROBNP   Education Assessment and Provision:  Detailed education and instructions provided on heart failure disease management including the following:  Signs and symptoms of Heart Failure When to call the physician Importance of daily weights Low sodium diet Fluid restriction Medication management Anticipated future follow-up appointments  Patient education given on each of the above topics.  Patient acknowledges understanding and acceptance of all instructions. I spoke with Mr. Cortright regarding his current hospitalization.  He tells me that he was told that he has 6 mo left and he is "fine with that".  He is scheduled for cardiac cath today and I asked him to take it day by day--see how the cath looks and what physician tells him. He denies having a scale at home and I will provide one.  I briefly reviewed a low sodium diet and high sodium foods to avoid.  He denies any issues with getting or taking prescribed medications.  He lives in [place] alone--his daughter and son are local--yet he is very independent and reluctant to ask family for  anything.  He will follow with [facility] after discharge.    Education Materials:  "Living Better With Heart Failure" Booklet, Daily Weight Tracker Tool .   High Risk Criteria for Readmission and/or Poor Patient Outcomes:  (Recommend Follow-up with [facility])   EF <30%-15-20%  2 or more admissions in 6 months- yes-2/56mo  Difficult social situation- No? Lives alone  Demonstrates medication noncompliance- No denies    Barriers of Care:  Health literacy, Knowledge and compliance  Discharge Planning:   Plans to discharge to home alone

## 2016-03-08 NOTE — Progress Notes (Signed)
Daily Progress Note   Patient Name: Johnathan Morrison       Date: 03/08/2016 DOB: [birth year]  Age: 70 y.o. MRN#: [id number] Attending Physician: Hosie Poisson, MD Primary Care Physician: Faith Date: 03/05/2016  Reason for Consultation/Follow-up: Establishing goals of care  Subjective: Met with patient and his son Johnathan Morrison.  Patient was asleep after cath, so Johnathan Morrison and I met in a conference room.  Mr. Johnathan Morrison had deferred to Johnathan Morrison to help with decisions regarding [place].  Johnathan Morrison and I discussed his fathers health, and then I attempted to elicit Mr. Johnathan Morrison' goals from [employer].  Johnathan Morrison politely interrupted me and said - "My father was homeless in [place].  He showed up after 30 years one month ago trying to improve his life by being near my sister and I.  I care about him, but he just showed up after 30 years.  I'm not making any decisions for him."  I replied that I did not believe Mr. Johnathan Morrison was capable of making these complex decisions on his own.  Johnathan Morrison said - "that's what he wants you to think.  He can make his own decisions."  We clearly discussed, and Johnathan Morrison understands that with an EF of 15%, COPD, pulm HTN and CKD his father is nearing end of life and that he likely only has months left to live.    Length of Stay: 0  Current Medications: Scheduled Meds:  . atorvastatin  40 mg Oral Daily  . carvedilol  6.25 mg Oral BID WC  . digoxin  125 mcg Oral QODAY  . feeding supplement (ENSURE ENLIVE)  237 mL Oral BID BM  . Glycerin (Adult)  1 suppository Rectal Once  . hydrALAZINE  25 mg Oral Q8H  . isosorbide mononitrate  30 mg Oral Daily  . levETIRAcetam  500 mg Oral Daily  . [START ON 03/09/2016] lisinopril  2.5 mg Oral Daily  . methimazole  40 mg Oral Daily  .  mexiletine  150 mg Oral Q8H  . pantoprazole  40 mg Oral Daily  . polyethylene glycol  17 g Oral Daily  . senna-docusate  2 tablet Oral BID  . sodium chloride flush  3 mL Intravenous Q12H  . sodium chloride flush  3 mL Intravenous Q12H  . spironolactone  12.5 mg Oral Daily  .  torsemide  10 mg Oral Daily  . warfarin  2.5 mg Oral ONCE-1800  . Warfarin - Pharmacist Dosing Inpatient   Does not apply q1800    Continuous Infusions: . sodium chloride      PRN Meds: sodium chloride, sodium chloride, acetaminophen, albuterol, ipratropium, LORazepam, morphine injection, nitroGLYCERIN, ondansetron **OR** ondansetron (ZOFRAN) IV, ondansetron (ZOFRAN) IV, sodium chloride flush, sodium chloride flush  Physical Exam      Thin pleasant male, currently sound asleep Resp:  NAD Cv:  irreg Abdomen soft     Vital Signs: BP 109/70 (BP Location: Right Arm)   Pulse 80   Temp 97.5 F (36.4 C) (Oral)   Resp (!) 22   Ht 6' (1.829 m)   Wt 78.1 kg (172 lb 1.6 oz) Comment: scale b  SpO2 99%   BMI 23.34 kg/m  SpO2: SpO2: 99 % O2 Device: O2 Device: Not Delivered O2 Flow Rate: O2 Flow Rate (L/min): 2 L/min  Intake/output summary:  Intake/Output Summary (Last 24 hours) at 03/08/16 1456 Last data filed at 03/08/16 1244  Gross per 24 hour  Intake              200 ml  Output             1100 ml  Net             -900 ml   LBM: Last BM Date:  ("last week") Baseline Weight: Weight: 78.9 kg (174 lb) Most recent weight: Weight: 78.1 kg (172 lb 1.6 oz) (scale b)       Palliative Assessment/Data:    Flowsheet Rows   Flowsheet Row Most Recent Value  Intake Tab  Referral Department  Cardiology  Unit at Time of Referral  Cardiac/Telemetry Unit  Palliative Care Primary Diagnosis  Cardiac  Date Notified  03/06/16  Palliative Care Type  New Palliative care  Reason for referral  Clarify Goals of Care  Date of Admission  03/05/16  Date first seen by Palliative Care  03/06/16  # of days Palliative  referral response time  0 Day(s)  # of days IP prior to Palliative referral  1  Clinical Assessment  Palliative Performance Scale Score  40%  Pain Max last 24 hours  6  Pain Min Last 24 hours  3  Psychosocial & Spiritual Assessment  Palliative Care Outcomes      Patient Active Problem List   Diagnosis Date Noted  . Protein-calorie malnutrition, severe 03/08/2016  . Severe protein-energy malnutrition ([place]) 03/08/2016  . Goals of care, counseling/discussion   . Palliative care encounter   . Loss of weight   . Chest pain 03/05/2016  . NSVT (nonsustained ventricular tachycardia) ([place]) 02/24/2016  . Chronic renal disease, stage III   . Diastolic dysfunction   . Pulmonary HTN - Rt heart cath June 2016   . COPD (chronic obstructive pulmonary disease) ([place])   . History of PE-March 2016   . Cardiomyopathy-presumably NICM   . Acute on chronic systolic congestive heart failure ([place])   . A-fib ([place]) 02/23/2016  . Chronic anticoagulation 02/23/2016  . 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 HTN (hypertension) 02/23/2016  . History of seizure 02/23/2016  . Acute on chronic systolic (congestive) heart failure ([place]) 02/23/2016  . CVA (cerebral infarction)-embolic Feb 6301 60/05/9322  . SAH (subarachnoid hemorrhage)-Dec 2015 07/15/2014    Palliative Care Assessment & Plan   Patient Profile: 70 y.o. male  with past medical history of SAH, CVA with residual left sided  weakness, seizures, pulmonary HTN, COPD, systolic HF with EF of 15 - 20%,  CKD stage 3, and afib, who was admitted on 03/05/2016 with chest pain and SOB.  Mr. Johnathan Morrison had a recent admission from 7/11 - 7/18 after being shocked by his ICD secondary to multiple episodes of V Tach.  During that hospitalization he had a cardiac cath that showed non-obstructive cardiomyopathy.  During the current hospitalization he was found to be hyperthyroid with a TSH of 0.027.  This was felt to possibly be due to amiodarone and consequently it has been  stopped. A repeat RHC was performed on 7/25.  Cardiac index was greater than 2.0.  Patient was not felt to be a good candidate for milrinone therapy at this time.  The recommendation was to treat his thyroid function.  Assessment: 70 yo male s/p SAH, CVA with residual left sided weakness.  Weak heart and pulm HTN.  He seems simple in his thought process - "What should I do?  I'll do what you tell me to do?".  His family support is not strong.     He's lost 100+ lbs in the past year.  Currently has hyperthyroidism possibly secondary to amio.  Amio discontinued.  Cardiology has completed a second heart cath and made their recommendations.  Recommendations/Plan:  DNR  Will approach Mr. Jaskiewicz again 7/26 in an attempt to complete a MOST form.  D/C to SNF for acute rehab with Palliative to follow (PLease write an order in DC summary for Palliative to follow at SNF)  After acute rehab is finished I strongly recommend reassessment of appropriateness for Hospice services.   Code Status:  DNR  Prognosis:   < 6 months secondary to end stage heart failure, pulm HTN, 100 lbs wt loss, and patient's not wanting aggressive treatment.  Discharge Planning:  [place] for rehab with Palliative care service follow-up  Care plan was discussed with patient, son  Thank you for allowing the Palliative Medicine Team to assist in the care of this patient.   Time In: 3:00 Time Out: 4:15 Total Time 75 min Prolonged Time Billed yes      Greater than 50%  of this time was spent counseling and coordinating care related to the above assessment and plan.  Melton Alar, PA-C  Please contact Palliative Medicine Team phone at [phone] for questions and concerns.

## 2016-03-09 ENCOUNTER — Inpatient Hospital Stay (HOSPITAL_COMMUNITY): Payer: Medicare Other

## 2016-03-09 DIAGNOSIS — I5023 Acute on chronic systolic (congestive) heart failure: Secondary | ICD-10-CM

## 2016-03-09 LAB — PROTIME-INR
INR: 2.41
PROTHROMBIN TIME: 26 s — AB (ref 11.4–15.2)

## 2016-03-09 LAB — BASIC METABOLIC PANEL
ANION GAP: 10 (ref 5–15)
BUN: 42 mg/dL — ABNORMAL HIGH (ref 6–20)
CALCIUM: 10.4 mg/dL — AB (ref 8.9–10.3)
CO2: 25 mmol/L (ref 22–32)
CREATININE: 1.39 mg/dL — AB (ref 0.61–1.24)
Chloride: 96 mmol/L — ABNORMAL LOW (ref 101–111)
GFR, EST AFRICAN AMERICAN: 59 mL/min — AB (ref >60)
GFR, EST NON AFRICAN AMERICAN: 51 mL/min — AB (ref >60)
Glucose, Bld: 95 mg/dL (ref 65–99)
Potassium: 4.3 mmol/L (ref 3.5–5.1)
SODIUM: 131 mmol/L — AB (ref 135–145)

## 2016-03-09 MED ORDER — ZOLPIDEM TARTRATE 5 MG PO TABS
5.0000 mg | ORAL_TABLET | Freq: Every evening | ORAL | Status: DC | PRN
  Administered 2016-03-09: 5 mg via ORAL
  Filled 2016-03-09: qty 1

## 2016-03-09 MED ORDER — WARFARIN SODIUM 5 MG PO TABS
5.0000 mg | ORAL_TABLET | Freq: Once | ORAL | Status: AC
  Administered 2016-03-09: 5 mg via ORAL
  Filled 2016-03-09: qty 1

## 2016-03-09 MED FILL — Lidocaine HCl Local Preservative Free (PF) Inj 1%: INTRAMUSCULAR | Qty: 30 | Status: AC

## 2016-03-09 NOTE — Clinical Social Work Note (Signed)
Clinical Social Work Assessment  Patient Details  Name: Johnathan Morrison MRN: [id number] Date of Birth: 25-Jan-1946  Date of referral:  03/05/16               Reason for consult:  Facility Placement, Discharge Planning                Permission sought to share information with:  Facility Sport and exercise psychologist, Family Supports Permission granted to share information::  Yes, Verbal Permission Granted  Name::     Johnathan Morrison  Agency::  SNF's  Relationship::  Daughter  Contact Information:  [phone]  Housing/Transportation Living arrangements for the past 2 months:  Apartment Source of Information:  Patient, Medical Team Patient Interpreter Needed:  None Criminal Activity/Legal Involvement Pertinent to Current Situation/Hospitalization:  No - Comment as needed Significant Relationships:  Adult Children Lives with:  Self Do you feel safe going back to the place where you live?  Yes Need for family participation in patient care:  Yes (Comment)  Care giving concerns:  Patient may be in need of SNF when he discharges.   Social Worker assessment / plan:  CSW met with patient. No support at bedside. CSW introduced role and explained that discharge planning would be discussed. Patient agreeable to SNF because it is what his children want him to do. He wants to stay in [facility]. CSW explained that patient needs to work with PT when they come back so that he will be able to qualify for SNF. Patient said he had no problem doing this. CSW provided SNF list and explained SNF search process. Patient expressed understanding. No further concerns. CSW encouraged patient to contact CSW as needed. CSW will continue to follow patient for support and facilitate discharge to SNF, if PT recommends.  Employment status:    Insurance information:  Medicare PT Recommendations:  Not assessed at this time Information / Referral to community resources:  [place]  Patient/Family's Response to care:  Patient agreeable to SNF. Patient's children somewhat supportive but involved in patient's care. Patient appreciated social work intervention.  Patient/Family's Understanding of and Emotional Response to Diagnosis, Current Treatment, and Prognosis:  Patient understands need for rehab prior to returning home. Patient appears happy with hospital care.  Emotional Assessment Appearance:  Appears stated age Attitude/Demeanor/Rapport:  Other (Pleasant) Affect (typically observed):  Accepting, Appropriate, Calm, Pleasant Orientation:  Oriented to Self, Oriented to Place, Oriented to  Time, Oriented to Situation Alcohol / Substance use:  Never Used Psych involvement (Current and /or in the community):  No (Comment)  Discharge Needs  Concerns to be addressed:  Care Coordination Readmission within the last 30 days:  Yes Current discharge risk:  Dependent with Mobility, Terminally ill, Lives alone Barriers to Discharge:  No Barriers Identified   Candie Chroman, LCSW 03/09/2016, 12:30 PM

## 2016-03-09 NOTE — Progress Notes (Signed)
Progress Note    Johnathan Morrison  SFK:[id number] DOB: Jan 29, 1946  DOA: 03/05/2016 PCP: [facility]    Brief Narrative:   Johnathan Morrison is an 70 y.o. male with medical history significant of a.fib, Bi V ICD, CKD, CVA, PAH, chronic systolic CHFnonischemic cardiomyopathy on EF 15-20%; who presents with complaints of left-sided chest pain and shortness of breath.  Assessment/Plan:   Principal Problem:   Chest pain In a patient with a history of nonischemic cardiomyopathy and class IV heart failure/acute on chronic systolic CHF with AICD/pacemaker in situ/history of nonsustained V. tach with a shock earlier this month  Patient has extensive cardiac history. His EF is 15-20 percent with severe LV dysfunction. Troponins were 0.043, with a flat trend. Had a recent left heart catheterization which showed nonobstructive coronary disease. He had a right heart catheterization done 03/08/16 to evaluate right heart pressures and sample oxygen saturations. His hemodynamics were well compensated. He is being considered for a VAD, however he has significant social issues that may preclude this type of therapy. As such, the palliative care team has been asked to follow along as it is unlikely he will be a candidate given his history of drug/alcohol abuse and poor family support. He was recently homeless. Continue medical therapies including Imdur, Coreg, digoxin, hydralazine,  Mexiletine, spironolactone and Demadex.  Active Problems:   A-fib (HCC)/chronic anticoagulation Continue Coreg, Mexiletine, Coumadin and digoxin.    HTN (hypertension) Continue Coreg, hydralazine, lisinopril, spironolactone and Demadex.    History of seizure Continue Keppra.    Chronic renal disease, stage III Monitor renal function. Baseline creatinine around 1.2. Creatinine slightly higher with diuresis.    COPD (chronic obstructive pulmonary disease) (HCC) Continue bronchodilators.    Loss of  weight/Protein-calorie malnutrition, severe Continue ensure supplements.    Hyperthyroidism Continue Tapazole. Thyroid ultrasound completed, single small right thyroid nodule 0.3 cm which did not meet criteria for biopsy. Consider repeat ultrasound in 12 months.    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   Family Communication/Anticipated D/C date and plan/Code Status   DVT prophylaxis: Coumadin ordered. Code Status: DO NOT RESUSCITATE.  Family Communication: No family at the bedside. Disposition Plan: Undefined at present. Likely will need placement.   Medical Consultants:    Cardiology   Procedures:    Anti-Infectives:   Anti-infectives    None      Subjective:    Johnathan Morrison is very sleepy and without complaints. Specifically, denies chest pain or shortness of breath.  Objective:    Vitals:   03/09/16 0835 03/09/16 0840 03/09/16 0848 03/09/16 1205  BP: 101/64 (!) 108/57 (!) 98/57 110/75  Pulse: 89 92 96 90  Resp:      Temp:    98.2 F (36.8 C)  TempSrc:    Oral  SpO2:    99%  Weight:      Height:        Intake/Output Summary (Last 24 hours) at 03/09/16 1349 Last data filed at 03/09/16 1300  Gross per 24 hour  Intake              240 ml  Output              750 ml  Net             -510 ml   Filed Weights   03/06/16 0002 03/07/16 1004 03/08/16 0454  Weight: 79.9 kg (176 lb 1.6 oz) 78.2 kg (172 lb 6.4 oz) 78.1 kg (172 lb 1.6 oz)    Exam: General exam: Appears calm and comfortable. Sleepy. Respiratory system: Clear to auscultation. Respiratory effort normal. Cardiovascular system: Heart sounds are irregular. No JVD,  rubs, gallops or clicks. No murmurs. Gastrointestinal system: Abdomen is nondistended, soft and nontender. No organomegaly or masses felt. Normal bowel sounds heard. Central nervous system: Sleepy. No focal  neurological deficits. Extremities: No clubbing, edema, or cyanosis. Skin: No rashes, lesions or ulcers Psychiatry: Judgement and insight appear impaired. Mood & affect flat.   Data Reviewed:   I have personally reviewed following labs and imaging studies:  Labs: Basic Metabolic Panel:  Recent Labs Lab 03/05/16 1942 03/05/16 1947 03/06/16 0318 03/07/16 1557 03/08/16 1118 03/09/16 0254  NA 130* 133* 132* 130* 131* 131*  K 5.1 5.3* 4.8 4.5 4.6 4.3  CL 98* 97* 102 93* 94* 96*  CO2 24  --  23 25 27 25   GLUCOSE 104* 103* 89 97 96 95  BUN 36* 40* 31* 33* 39* 42*  CREATININE 1.40* 1.40* 1.18 1.41* 1.56* 1.39*  CALCIUM 10.0  --  9.8 10.3 10.4* 10.4*   GFR Estimated Creatinine Clearance: 55.8 mL/min (by C-G formula based on SCr of 1.39 mg/dL). Liver Function Tests:  Recent Labs Lab 03/03/16 1030 03/06/16 0318  AST 32 30  ALT 27 24  ALKPHOS 77 63  BILITOT 1.6* 1.1  PROT 9.1* 8.1  ALBUMIN 4.2 3.6   No results for input(s): LIPASE, AMYLASE in the last 168 hours. No results for input(s): AMMONIA in the last 168 hours. Coagulation profile  Recent Labs Lab 03/06/16 0318 03/07/16 0312 03/07/16 1557 03/08/16 0340 03/09/16 0254  INR 3.97* 3.81* 3.20* 3.02* 2.41    CBC:  Recent Labs Lab 03/03/16 1030 03/05/16 1942 03/05/16 1947 03/06/16 0318 03/07/16 1557 03/08/16 1118  WBC 4.3 3.8*  --  3.5* 4.2 3.9*  NEUTROABS 2.4  --   --   --   --   --   HGB 15.0 14.9 16.3 14.1 15.2 15.3  HCT 45.0 44.7 48.0 43.1 45.5 45.8  MCV 91.8 92.2  --  92.3 91.9 91.6  PLT 259 279  --  249 283 281   Cardiac Enzymes:  Recent Labs Lab 03/03/16 1030 03/06/16 0012 03/06/16 0318 03/06/16 1001  TROPONINI 0.03* 0.04* 0.04* 0.04*   Thyroid function studies:  Recent Labs  03/07/16 1010  TSH 0.027*  T3FREE 9.9*   Sepsis Labs:  Recent Labs Lab 03/03/16 1146 03/05/16 1942 03/06/16 0318 03/07/16 1557 03/08/16 1118  WBC  --  3.8* 3.5* 4.2 3.9*  LATICACIDVEN 1.65  --   --    --   --    Urine analysis:    Component Value Date/Time   COLORURINE YELLOW 03/06/2016 2055   APPEARANCEUR CLEAR 03/06/2016 2055   LABSPEC 1.015 03/06/2016 2055   PHURINE 5.0 03/06/2016 2055   GLUCOSEU NEGATIVE 03/06/2016 2055   HGBUR NEGATIVE 03/06/2016 2055   BILIRUBINUR NEGATIVE 03/06/2016 2055   KETONESUR NEGATIVE 03/06/2016 2055   PROTEINUR NEGATIVE 03/06/2016 2055   NITRITE NEGATIVE 03/06/2016 2055   LEUKOCYTESUR NEGATIVE 03/06/2016 2055   Microbiology Recent Results (from the past 240 hour(s))  Culture, blood (routine x 2)     Status: None   Collection Time: 03/03/16 12:15 PM  Result Value Ref Range Status   Specimen Description BLOOD RIGHT ANTECUBITAL  Final   Special Requests BOTTLES DRAWN AEROBIC AND ANAEROBIC 5CC EACH  Final   Culture NO GROWTH 5 DAYS  Final   Report Status 03/08/2016 FINAL  Final  Culture, blood (routine x 2)     Status: None   Collection Time: 03/03/16 12:25 PM  Result Value Ref Range Status   Specimen Description BLOOD RIGHT FOREARM  Final   Special Requests BOTTLES DRAWN AEROBIC AND ANAEROBIC 5CC EACH  Final   Culture NO GROWTH 5 DAYS  Final   Report Status 03/08/2016 FINAL  Final    Radiology: Dg Orthopantogram  Result Date: 03/09/2016 CLINICAL DATA:  Preoperative evaluation to rule out surgical contraindication Dental caries ,, EXAM: ORTHOPANTOGRAM/PANORAMIC COMPARISON:  None. FINDINGS: There are numerous missing teeth. There are single remaining maxillary molars, 1 on each side. Single remaining maxillary incisor or lateral incisor noted on the left. There is a single left remaining mandibular molar, with remaining incisors, lateral incisors and canines. Lucency in the left mandibular canine suggests a carie. There is no significant periapical lucency to suggest a periapical abscess. No fracture.  No bone lesion. IMPRESSION: 1. Multiple missing teeth.  No evidence of a root abscess. 2. Probable carie within the remaining the left mandibular  canine. No other convincing caries. Electronically Signed   By: Amie Portland M.D.   On: 03/09/2016 08:26  US Thyroid  Result Date: 03/08/2016 CLINICAL DATA:  Hyperthyroidism. EXAM: THYROID ULTRASOUND TECHNIQUE: Ultrasound examination of the thyroid gland and adjacent soft tissues was performed. COMPARISON:  Chest CT 02/12/2016 FINDINGS: Right thyroid lobe Measurements: 4.3 x 2.4 x 1.3 cm. Hypoechoic nodule with a small echogenic focus in the superior right thyroid lobe measuring up to 0.3 cm. Right thyroid tissue is mildly heterogeneous. Left thyroid lobe Measurements: 3.6 x 2.7 x 1.3 cm. Left thyroid tissue is mildly heterogeneous without a discrete nodule. Isthmus Thickness: 0.4 cm.  No nodules visualized. Lymphadenopathy None visualized. IMPRESSION: Single small right thyroid nodule measuring up to 0.3 cm. Findings do not meet current SRU consensus criteria for biopsy. Follow-up by clinical exam is recommended. If patient has known risk factors for thyroid carcinoma, consider follow-up ultrasound in 12 months. If patient is clinically hyperthyroid, consider nuclear medicine thyroid uptake and scan.Reference: Management of Thyroid Nodules Detected at [place]: Society of Radiologists in Ultrasound Consensus Conference Statement. Radiology 2005; [id number]. Electronically Signed   By: Richarda Overlie M.D.   On: 03/08/2016 16:48   Medications:   . atorvastatin  40 mg Oral Daily  . carvedilol  6.25 mg Oral BID WC  . digoxin  125 mcg Oral QODAY  . feeding supplement (ENSURE ENLIVE)  237 mL Oral BID BM  . Glycerin (Adult)  1 suppository Rectal Once  . hydrALAZINE  25 mg Oral Q8H  . isosorbide mononitrate  30 mg Oral Daily  . levETIRAcetam  500 mg Oral Daily  . lisinopril  2.5 mg Oral Daily  . methimazole  40 mg Oral Daily  . mexiletine  150 mg Oral Q8H  . pantoprazole  40 mg Oral Daily  . polyethylene glycol  17 g Oral Daily  . senna-docusate  2 tablet Oral BID  . sodium chloride flush  3 mL Intravenous  Q12H  . sodium chloride flush  3 mL Intravenous Q12H  . spironolactone  12.5 mg Oral Daily  . torsemide  10 mg Oral Daily  . Warfarin - Pharmacist Dosing Inpatient   Does not apply q1800   Continuous Infusions:   Time spent: 25 minutes.   LOS: 1 day   Johnathan Morrison  [facility] Pager [phone].  If unable to reach me by pager, please call my cell phone at [phone].  *Please refer to [email or web address], password [email or web address] to get updated schedule on who will round on this patient, as hospitalists switch teams weekly. If 7PM-7AM, please contact night-coverage at [email or web address], password [email or web address] for any overnight needs.  03/09/2016, 1:49 PM

## 2016-03-09 NOTE — Progress Notes (Signed)
Pt complaining of CP and SOB after returning from radiology. Pt otherwise asymptomatic. CP 7/10 stated as heaviness. EKG done. BP 113/69. Administered 3 nitro sublingual. Took pain to 3/10 stated as heaviness. BP 98/57. Paged attending. Will continue to monitor.

## 2016-03-09 NOTE — Progress Notes (Signed)
ANTICOAGULATION CONSULT NOTE Pharmacy Consult for Coumadin Indication: atrial fibrillation  Allergies  Allergen Reactions  . 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  Assessment: 70 y.o. male admitted with chest pain, h/o Afib, to continue Coumadin.  INR trending down to 2.4 today. Warfarin restarted last night.  PTA Coumadin dose 5 mg daily, but INR 3.7 at admission.  Goal of Therapy:  INR 2-3 Monitor platelets by anticoagulation protocol: Yes   Plan:  Will resume Coumadin tonight with 5 mg x 1. Daily INR  Sheppard Coil PharmD., BCPS Clinical Pharmacist Pager [phone] 03/09/2016 2:49 PM

## 2016-03-09 NOTE — Consult Note (Signed)
[address]       [place] [address]             [phone]        Kalik Hoare [facility] Medical Record #[id number] Date of Birth: May 15, 1946  Referring:Primitivo Gauze  Primary Care: [facility]  Chief Complaint:    Chief Complaint  Patient presents with  . Chest Pain  Patient examined, 2-D echocardiogram and CTA of chest and right heart cath data personally reviewed and counseled with patient   History of Present Illness:     70 year old AA male reformed smoker admitted to the hospital with class IV heart failure from acute on chronic systolic heart failure secondary to nonischemic cardiomyopathy. Patient recently moved to this area from [place]. He has been evaluated and treated in the emergency department on 3 occasions for shortness of breath-CHF. The patient apparently has had previous catheterization showing no coronary disease. He has previous placement of AICD-pacer. The patient had nonsustained VT with a shock earlier this month.. The patient has been on chronic Coumadin anticoagulation. In 2015 he had a subarachnoid hemorrhage  -did not require brain surgery. During his hospitalization. As heart failure service was asked to evaluate and treat the patient because of refractory symptoms were advanced heart failure. His ejection fraction is 15-20 percent. Echocardiogram shows severe LV dilatation and dysfunction, mild RV dysfunction, moderate TR which is eccentric probably from the AICD leads. No significant AI.  Right heart cath performed yesterday demonstrated adequate cardiac output, low filling pressures and the patient was not placed on milrinone.  The patient has been on Coumadin for at least 2 years and has had no evidence or signs of GI bleeding. The patient stopprd smoking about 3 years ago. CT scan of chest shows no pulmonary nodules or at risk adenopathy. The patient's creatinine was elevated at 2.8 on admission but now has  normalized with medical therapy of his heart failure. The patient has had bilateral knee replacement surgery. No residual neural deficit from the subarachnoid hemorrhage in 2015. He is on chronic Keppra for a seizure disorder History of pulmonary embolus per patient The patient has lost 15-20 pounds of weight with diuresis. Clinically he feels improved but still easily fatigued. He has a painful left maxillary tooth that we'll need evaluation. He is plan for discharge and careful follow-up in the advanced heart failure clinic with outpatient CPX testing He denies heavy alcohol intake.  Current Activity/ Functional Status: Patient currently lives alone He has no specific details about support from family or friends if he proceeds with VAD therapy   Zubrod Score: At the time of surgery this patient's most appropriate activity status/level should be described as: []     0    Normal activity, no symptoms []     1    Restricted in physical strenuous activity but ambulatory, able to do out light work [x]     2    Ambulatory and capable of self care, unable to do work activities, up and about                 more than 50%  Of the time                            []     3    Only limited self care, in bed greater than 50% of waking hours []     4  Completely disabled, no self care, confined to bed or chair     5    Moribund  Past Medical History:  Diagnosis Date  . Atrial fibrillation (HCC)   . Cardiomyopathy (HCC)   . Chronic renal disease, stage III   . Chronic systolic heart failure (HCC)   . COPD (chronic obstructive pulmonary disease) (HCC)   . 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 Diastolic dysfunction    Grade 3  . Essential hypertension   . PE (pulmonary embolism) March 2016  . Pulmonary HTN (HCC)    Rt heart cath June 2016  . SAH (subarachnoid hemorrhage) (HCC) Dec 2015  . Seizures (HCC)     Past Surgical History:  Procedure Laterality Date  . ANKLE SURGERY    .  CARDIAC CATHETERIZATION N/A 02/25/2016   Procedure: Right/Left Heart Cath and Coronary Angiography;  Surgeon: Laurey Morale, MD;  Location: [facility] INVASIVE CV LAB;  Service: Cardiovascular;  Laterality: N/A;  . CARDIAC CATHETERIZATION N/A 03/08/2016   Procedure: Right Heart Cath;  Surgeon: Laurey Morale, MD;  Location: [facility] INVASIVE CV LAB;  Service: Cardiovascular;  Laterality: N/A;  . CORONARY ANGIOPLASTY    . CORONARY ANGIOPLASTY    . INSERTION OF ICD  09/25/12   Bi V [employer] Jude   . IVC FILTER PLACEMENT [facility] HX)  March 2016  . IVC FILTER REMOVAL [facility] HX)  Oct 2016  . TOTAL KNEE ARTHROPLASTY Bilateral     History  Smoking Status  . Former Smoker  Smokeless Tobacco  . Not on file    Comment: smoked 1.5 ppd x 30 years, quit in 2012)    History  Alcohol Use No    Social History   Social History  . Marital status: Single    Spouse name: N/A  . Number of children: N/A  . Years of education: N/A   Occupational History  . Not on file.   Social History Main Topics  . Smoking status: Former Games developer  . Smokeless tobacco: Not on file     Comment: smoked 1.5 ppd x 30 years, quit in 2012)  . Alcohol use No  . Drug use: No  . Sexual activity: Not on file   Other Topics Concern  . Not on file   Social History Narrative  . No narrative on file    Allergies  Allergen Reactions  . 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 0.9 %  sodium chloride infusion  250 mL Intravenous PRN Laurey Morale, MD      . 0.9 %  sodium chloride infusion  250 mL Intravenous PRN Laurey Morale, MD      . acetaminophen (TYLENOL) tablet 650 mg  650 mg Oral Q4H PRN Laurey Morale, MD      . albuterol (PROVENTIL) (2.5 MG/3ML) 0.083% nebulizer solution 2.5 mg  2.5 mg Nebulization Q4H PRN Clydie Braun, MD      . atorvastatin (LIPITOR) tablet 40 mg   40 mg Oral Daily Clydie Braun, MD   40 mg at 03/08/16 1001  . carvedilol (COREG) tablet 6.25 mg  6.25 mg Oral BID WC Rondell Burtis Junes, MD   6.25 mg at 03/08/16 1723  . digoxin (LANOXIN) tablet 125 mcg  125 mcg Oral QODAY Julaine Hua, MD   125 mcg at 03/08/16  1015  . feeding supplement (ENSURE ENLIVE) (ENSURE ENLIVE) liquid 237 mL  237 mL Oral BID BM Kathlen Mody, MD   237 mL at 03/08/16 1516  . Glycerin (Adult) 2.1 g suppository 1 suppository  1 suppository Rectal Once Stephani Police, PA-C      . hydrALAZINE (APRESOLINE) tablet 25 mg  25 mg Oral Q8H Rondell Burtis Junes, MD   25 mg at 03/09/16 0525  . ipratropium (ATROVENT) nebulizer solution 0.5 mg  0.5 mg Nebulization Q4H PRN Clydie Braun, MD      . isosorbide mononitrate (IMDUR) 24 hr tablet 30 mg  30 mg Oral Daily Clydie Braun, MD   Stopped at 03/08/16 1017  . levETIRAcetam (KEPPRA) tablet 500 mg  500 mg Oral Daily Clydie Braun, MD   500 mg at 03/08/16 1001  . lisinopril (PRINIVIL,ZESTRIL) tablet 2.5 mg  2.5 mg Oral Daily Amy D Clegg, NP      . LORazepam (ATIVAN) tablet 0.5 mg  0.5 mg Oral Q8H PRN Clydie Braun, MD      . methimazole (TAPAZOLE) tablet 40 mg  40 mg Oral Daily Kathlen Mody, MD   40 mg at 03/08/16 1740  . mexiletine (MEXITIL) capsule 150 mg  150 mg Oral Q8H Will Jorja Loa, MD   150 mg at 03/09/16 0525  . morphine 2 MG/ML injection 1-2 mg  1-2 mg Intravenous Q4H PRN Kathlen Mody, MD   2 mg at 03/07/16 1929  . nitroGLYCERIN (NITROSTAT) SL tablet 0.4 mg  0.4 mg Sublingual Q5 min PRN Kathlen Mody, MD   0.4 mg at 03/09/16 0844  . ondansetron (ZOFRAN) tablet 4 mg  4 mg Oral Q6H PRN Clydie Braun, MD       Or  . ondansetron (ZOFRAN) injection 4 mg  4 mg Intravenous Q6H PRN Clydie Braun, MD      . ondansetron (ZOFRAN) injection 4 mg  4 mg Intravenous Q6H PRN Laurey Morale, MD      . pantoprazole (PROTONIX) EC tablet 40 mg  40 mg Oral Daily Clydie Braun, MD   40 mg at 03/08/16 1002  . polyethylene glycol  (MIRALAX / GLYCOLAX) packet 17 g  17 g Oral Daily Kathlen Mody, MD   17 g at 03/08/16 1523  . senna-docusate (Senokot-S) tablet 2 tablet  2 tablet Oral BID Kathlen Mody, MD   2 tablet at 03/08/16 2125  . sodium chloride flush (NS) 0.9 % injection 3 mL  3 mL Intravenous Q12H Laurey Morale, MD      . sodium chloride flush (NS) 0.9 % injection 3 mL  3 mL Intravenous PRN Laurey Morale, MD      . sodium chloride flush (NS) 0.9 % injection 3 mL  3 mL Intravenous Q12H Laurey Morale, MD   3 mL at 03/08/16 2126  . sodium chloride flush (NS) 0.9 % injection 3 mL  3 mL Intravenous PRN Laurey Morale, MD      . spironolactone (ALDACTONE) tablet 12.5 mg  12.5 mg Oral Daily Clydie Braun, MD   12.5 mg at 03/08/16 1002  . torsemide (DEMADEX) tablet 10 mg  10 mg Oral Daily Clydie Braun, MD   10 mg at 03/08/16 1002  . Warfarin - Pharmacist Dosing Inpatient   Does not apply q1800 Clydie Braun, MD        Prescriptions Prior to Admission  Medication Sig Dispense Refill Last Dose  . acetaminophen (TYLENOL)  500 MG tablet Take 1,000 mg by mouth 2 (two) times daily as needed for mild pain or headache.    03/03/2016  . acetaminophen-codeine (TYLENOL #3) 300-30 MG tablet Take 1 tablet by mouth daily as needed for moderate pain.   Past Month at Unknown time  . albuterol (PROVENTIL HFA;VENTOLIN HFA) 108 (90 Base) MCG/ACT inhaler Inhale 2 puffs into the lungs every 6 (six) hours as needed for wheezing or shortness of breath. 1 Inhaler 0 03/05/2016 at Unknown time  . amiodarone (PACERONE) 200 MG tablet Take 1 tablet (200 mg total) by mouth 2 (two) times daily. 60 tablet 6 03/05/2016 at Unknown time  . atorvastatin (LIPITOR) 40 MG tablet Take 40 mg by mouth every morning.   0 03/05/2016 at Unknown time  . carvedilol (COREG) 6.25 MG tablet Take 1 tablet (6.25 mg total) by mouth 2 (two) times daily with a meal. 60 tablet 6 03/05/2016 at 0800  . digoxin (LANOXIN) 0.125 MG tablet Take 125 mcg by mouth daily.  6 03/05/2016  at Unknown time  . esomeprazole (NEXIUM) 20 MG capsule Take 20 mg by mouth daily at 12 noon.   2 weeks  . hydrALAZINE (APRESOLINE) 25 MG tablet Take 1 tablet (25 mg total) by mouth 3 (three) times daily. (Patient taking differently: Take 25 mg by mouth every 8 (eight) hours. ) 90 tablet 6 03/05/2016 at Unknown time  . isosorbide mononitrate (IMDUR) 30 MG 24 hr tablet Take 1 tablet (30 mg total) by mouth daily. 30 tablet 6 03/05/2016 at Unknown time  . levETIRAcetam (KEPPRA) 500 MG tablet Take 500 mg by mouth daily.    03/05/2016 at Unknown time  . lisinopril (PRINIVIL,ZESTRIL) 2.5 MG tablet Take 2.5 mg by mouth 2 (two) times daily.   3 03/05/2016 at Unknown time  . LORazepam (ATIVAN) 0.5 MG tablet Take 0.5 mg by mouth every 8 (eight) hours as needed for anxiety. Reported on 02/23/2016   2 weeks  . nitroGLYCERIN (NITROSTAT) 0.4 MG SL tablet Place 1 tablet (0.4 mg total) under the tongue every 5 (five) minutes as needed for chest pain. 30 tablet 0 03/05/2016 at Unknown time  . potassium chloride (K-DUR) 10 MEQ tablet Take 20 mEq by mouth 2 (two) times daily.  6 03/05/2016 at Unknown time  . spironolactone (ALDACTONE) 25 MG tablet Take 12.5 mg by mouth daily.   03/05/2016 at Unknown time  . torsemide (DEMADEX) 20 MG tablet Take 10 mg by mouth daily.  6 03/05/2016 at Unknown time  . warfarin (COUMADIN) 5 MG tablet Take 1 tablet (5 mg total) by mouth daily. (Patient taking differently: Take 5 mg by mouth every morning. ) 30 tablet 6 03/05/2016 at 0800    Family History  Problem Relation Age of Onset  . Cardiomyopathy    . Sudden death       Review of Systems:       Cardiac Review of Systems: Y or N  Chest Pain [  No  ]  Resting SOB [ yes  ] Exertional SOB  Mahler.Beck  ]  Orthopnea Mahler.Beck ]   Pedal Edema [  yes improved ]    Palpitations [ yes-nonsustained VT ] Syncope  [  no]   Presyncope [  no ]  General Review of Systems: [Y] = yes [  ]=no Constitional: recent weight change [yes weight loss from diuresis  ];  anorexia Mahler.Beck  ]; fatigue [ yes ]; nausea [  ]; night sweats [  ]; fever [  ];  or chills [  ]                                                               Dental: poor dentition[  ]; Last Dentist visit: Greater than one year-painful left upper tooth   Eye : blurred vision [  ]; diplopia [   ]; vision changes [  ];  Amaurosis fugax[  ]; Resp: cough [  ];  wheezing[  ];  hemoptysis[  ]; shortness of breath[  yes]; paroxysmal nocturnal dyspnea[  ]; dyspnea on exertion[ yes ]; or orthopnea[  ];  GI:  gallstones[  ], vomiting[  ];  dysphagia[  ]; melena[  ];  hematochezia [  ]; heartburn[  ];   Hx of  Colonoscopy[  ]; GU: kidney stones [  ]; hematuria[  ];   dysuria [  ];  nocturia[  ];  history of     obstruction [  ]; urinary frequency [  ]             Skin: rash, swelling[  ];, hair loss[  ];  peripheral edema[  ];  or itching[  ]; Musculosketetal: myalgias[  ];  joint swelling[  ];  joint erythema[  ];  joint pain[  ];  back pain[  ];  Heme/Lymph: bruising[  ];  bleeding[  ];  anemia[  ];  Neuro: TIA[  ];  headaches[  ];  stroke[  ];  vertigo[  ];  seizures[  ];   paresthesias[  ];  difficulty walking[  ];  Psych:depression[  ]; anxiety[  ];  Endocrine: diabetes[  ];  thyroid dysfunction[  ];  Immunizations: Flu [  ]; Pneumococcal[  ];  Other: Right-hand dominant                          Retired Naval architect                          No history of thoracic trauma or pneumothorax  Physical Exam: BP (!) 98/57   Pulse 96   Temp 98 F (36.7 C) (Oral)   Resp 20   Ht 6' (1.829 m)   Wt 172 lb 1.6 oz (78.1 kg) Comment: scale b  SpO2 98%   BMI 23.34 kg/m        Physical Exam  General: Chronically ill-appearing middle-aged AA male comfortable lying supine in bed HEENT: Normocephalic pupils equal , dentition adequate Neck: Supple without JVD, adenopathy, or bruit Chest: Clear to auscultation, symmetrical breath sounds, no rhonchi, no tenderness             or deformity Cardiovascular:  Regular rate and rhythm, no murmur, no gallop, peripheral pulses             palpable in all extremities Abdomen:  Soft, nontender, no palpable mass or organomegaly Extremities: Warm, well-perfused, no clubbing cyanosis edema or tenderness,              no venous stasis changes of the legs Rectal/GU: Deferred Neuro: Grossly non--focal and symmetrical throughout Skin: Clean and dry without rash or ulceration   Diagnostic Studies & Laboratory data:     Recent Radiology Findings:  Dg Orthopantogram  Result Date: 03/09/2016 CLINICAL DATA:  Preoperative evaluation to rule out surgical contraindication Dental caries ,, EXAM: ORTHOPANTOGRAM/PANORAMIC COMPARISON:  None. FINDINGS: There are numerous missing teeth. There are single remaining maxillary molars, 1 on each side. Single remaining maxillary incisor or lateral incisor noted on the left. There is a single left remaining mandibular molar, with remaining incisors, lateral incisors and canines. Lucency in the left mandibular canine suggests a carie. There is no significant periapical lucency to suggest a periapical abscess. No fracture.  No bone lesion. IMPRESSION: 1. Multiple missing teeth.  No evidence of a root abscess. 2. Probable carie within the remaining the left mandibular canine. No other convincing caries. Electronically Signed   By: Amie Portland M.D.   On: 03/09/2016 08:26  US Thyroid  Result Date: 03/08/2016 CLINICAL DATA:  Hyperthyroidism. EXAM: THYROID ULTRASOUND TECHNIQUE: Ultrasound examination of the thyroid gland and adjacent soft tissues was performed. COMPARISON:  Chest CT 02/12/2016 FINDINGS: Right thyroid lobe Measurements: 4.3 x 2.4 x 1.3 cm. Hypoechoic nodule with a small echogenic focus in the superior right thyroid lobe measuring up to 0.3 cm. Right thyroid tissue is mildly heterogeneous. Left thyroid lobe Measurements: 3.6 x 2.7 x 1.3 cm. Left thyroid tissue is mildly heterogeneous without a discrete nodule. Isthmus  Thickness: 0.4 cm.  No nodules visualized. Lymphadenopathy None visualized. IMPRESSION: Single small right thyroid nodule measuring up to 0.3 cm. Findings do not meet current SRU consensus criteria for biopsy. Follow-up by clinical exam is recommended. If patient has known risk factors for thyroid carcinoma, consider follow-up ultrasound in 12 months. If patient is clinically hyperthyroid, consider nuclear medicine thyroid uptake and scan.Reference: Management of Thyroid Nodules Detected at [place]: Society of Radiologists in Ultrasound Consensus Conference Statement. Radiology 2005; [id number]. Electronically Signed   By: Richarda Overlie M.D.   On: 03/08/2016 16:48    I have independently reviewed the above radiologic studies.  Recent Lab Findings: Lab Results  Component Value Date   WBC 3.9 (L) 03/08/2016   HGB 15.3 03/08/2016   HCT 45.8 03/08/2016   PLT 281 03/08/2016   GLUCOSE 95 03/09/2016   ALT 24 03/06/2016   AST 30 03/06/2016   NA 131 (L) 03/09/2016   K 4.3 03/09/2016   CL 96 (L) 03/09/2016   CREATININE 1.39 (H) 03/09/2016   BUN 42 (H) 03/09/2016   CO2 25 03/09/2016   TSH 0.027 (L) 03/07/2016   INR 2.41 03/09/2016      Assessment / Plan:     Nonischemic cardiomyopathy    Severe LV dysfunction L EF 15-20%    Acute on chronic systolic heart failure with episodes of nonsustained VT and fluid overload prior to admission  History of smoking-COPD, weight loss and fatigue, previous subarachnoid hemorrhage in 2015, painful left maxillary tooth was unimpressive Panorex study  Patient's hemodynamics are fairly well compensated to his LV dysfunction by recent right heart cath. I agree with plan for hospital discharge and careful follow-up in the advanced heart failure clinic to further finish VAD evaluation. He appears to be a appropriate candidate if VAD is recommended in the future but social-support issues will need to be clarified.      @ME1 @ 03/09/2016 9:50 AM

## 2016-03-09 NOTE — Progress Notes (Addendum)
Daily Progress Note   Patient Name: Johnathan Morrison       Date: 03/09/2016 DOB: 1946-02-10  Age: 70 y.o. MRN#: [id number] Attending Physician: Venetia Maxon Rama, MD Primary Care Physician: [place] Date: 03/05/2016  Reason for Consultation/Follow-up: Establishing goals of care  Subjective:  After meeting with son yesterday - son reports that he cares for his father but "He just showed up after 60 years" - son was not inclined to help his father with goals of care decision making or support his father.   I went back to visit Johnathan Morrison this morning to complete a MOST form and discuss the appropriate time to turn off his ICD.  When I came in the patient told me "They are going to put a pump in my chest" and showed me Dr. Lucianne Lei Trigt's card.  The patient went on to say that he can't remember what is being told to him and that this is very frustrating to him.  He said he had prayed to the [place] several times to "take him", that he had "nothing to live for".    We discussed his thyroid function.  His 100 lb weight loss over the last year.  We talked about how his kids have their own lives (with children and careers) and he does not have them to depend on.  The patient seemed confused and overwhelmed.  I spoke with [name] by telephone this morning to explain that his father is going to start a process to be considered for VAD placement.  [name] was encouraged by this and thought it was positive news.   I met with Amy Clegg and Dr. Rockne Menghini in the hallway and briefly discussed his care.  Consideration for VAD placement will significantly change a goals of care conversation.  It seems at this point like more information in needed.    Length of Stay: 1  Current Medications: Scheduled  Meds:  . atorvastatin  40 mg Oral Daily  . carvedilol  6.25 mg Oral BID WC  . digoxin  125 mcg Oral QODAY  . feeding supplement (ENSURE ENLIVE)  237 mL Oral BID BM  . Glycerin (Adult)  1 suppository Rectal Once  . hydrALAZINE  25 mg Oral Q8H  . isosorbide mononitrate  30 mg Oral Daily  . levETIRAcetam  500 mg Oral Daily  . lisinopril  2.5 mg Oral Daily  . methimazole  40 mg Oral Daily  . mexiletine  150 mg Oral Q8H  . pantoprazole  40 mg Oral Daily  . polyethylene glycol  17 g Oral Daily  . senna-docusate  2 tablet Oral BID  . sodium chloride flush  3 mL Intravenous Q12H  . sodium chloride flush  3 mL Intravenous Q12H  . spironolactone  12.5 mg Oral Daily  . torsemide  10 mg Oral Daily  . Warfarin - Pharmacist Dosing Inpatient   Does not apply q1800    Continuous Infusions:    PRN Meds: sodium chloride, sodium chloride, acetaminophen, albuterol, ipratropium, LORazepam, morphine injection, nitroGLYCERIN, ondansetron **OR** ondansetron (ZOFRAN) IV, ondansetron (ZOFRAN) IV, sodium chloride flush, sodium chloride flush  Physical Exam      Thin pleasant male, slightly anxious. Resp:  NAD Cv:  irreg Abdomen soft   Ext.  Able to move all 4   Vital Signs: BP (!) 98/57   Pulse 96   Temp 98 F (36.7 C) (Oral)   Resp 20   Ht 6' (1.829 m)   Wt 78.1 kg (172 lb 1.6 oz) Comment: scale b  SpO2 98%   BMI 23.34 kg/m  SpO2: SpO2: 98 % O2 Device: O2 Device: Not Delivered O2 Flow Rate: O2 Flow Rate (L/min): 2 L/min  Intake/output summary:   Intake/Output Summary (Last 24 hours) at 03/09/16 1058 Last data filed at 03/09/16 0500  Gross per 24 hour  Intake                0 ml  Output              600 ml  Net             -600 ml   LBM: Last BM Date:  ("last week") Baseline Weight: Weight: 78.9 kg (174 lb) Most recent weight: Weight: 78.1 kg (172 lb 1.6 oz) (scale b)       Palliative Assessment/Data:    Flowsheet Rows   Flowsheet Row Most Recent Value  Intake Tab  Referral  Department  Cardiology  Unit at Time of Referral  Cardiac/Telemetry Unit  Palliative Care Primary Diagnosis  Cardiac  Date Notified  03/06/16  Palliative Care Type  New Palliative care  Reason for referral  Clarify Goals of Care  Date of Admission  03/05/16  Date first seen by Palliative Care  03/06/16  # of days Palliative referral response time  0 Day(s)  # of days IP prior to Palliative referral  1  Clinical Assessment  Palliative Performance Scale Score  40%  Pain Max last 24 hours  6  Pain Min Last 24 hours  3  Psychosocial & Spiritual Assessment  Palliative Care Outcomes      Patient Active Problem List   Diagnosis Date Noted  . Protein-calorie malnutrition, severe 03/08/2016  . Severe protein-energy malnutrition ([place]) 03/08/2016  . Hyperthyroidism   . Goals of care, counseling/discussion   . Palliative care encounter   . Loss of weight   . Chest pain 03/05/2016  . NSVT (nonsustained ventricular tachycardia) ([place]) 02/24/2016  . Chronic renal disease, stage III   . Diastolic dysfunction   . Pulmonary HTN - Rt heart cath June 2016   . COPD (chronic obstructive pulmonary disease) ([place])   . History of PE-March 2016   . Cardiomyopathy-presumably NICM   . Acute on chronic systolic congestive heart failure ([place])   .  A-fib ([place]) 02/23/2016  . Chronic anticoagulation 02/23/2016  . 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 HTN (hypertension) 02/23/2016  . History of seizure 02/23/2016  . Acute on chronic systolic (congestive) heart failure ([place]) 02/23/2016  . CVA (cerebral infarction)-embolic Feb 3435 68/61/6837  . SAH (subarachnoid hemorrhage)-Dec 2015 07/15/2014    Palliative Care Assessment & Plan   Patient Profile: 70 y.o. male  with past medical history of SAH, CVA with residual left sided weakness, seizures, pulmonary HTN, COPD, systolic HF with EF of 15 - 20%,  CKD stage 3, and afib, who was admitted on 03/05/2016 with chest pain and SOB.  Johnathan Morrison had a recent  admission from 7/11 - 7/18 after being shocked by his ICD secondary to multiple episodes of V Tach.  During that hospitalization he had a cardiac cath that showed non-obstructive cardiomyopathy.  During the current hospitalization he was found to be hyperthyroid with a TSH of 0.027.  This was felt to possibly be due to amiodarone and consequently it has been stopped. A repeat RHC was performed on 7/25.  Cardiac index was greater than 2.0.  Patient was not felt to be a good candidate for milrinone therapy at this time.  The recommendation was to treat his thyroid function.  Assessment: 70 yo male s/p SAH, CVA with residual left sided weakness and end stage heart failure.   He seems simple in his thought process - "What should I do?  I'll do what you tell me to do?".  His family support is not strong.     He's lost 100+ lbs in the past year.  Currently has hyperthyroidism possibly secondary to amio.  Amio discontinued.  Cardiology has completed a second heart cath and made their recommendations.  Consideration is being given to VAD placement.  Recommendations/Plan:  DNR  Physical therapy evaluation  Treatment of the Thyroid  D/C to SNF for acute rehab with Palliative to follow (PLease write an order in DC summary for Palliative to follow at SNF)  After acute rehab is finished I strongly recommend reassessment of appropriateness for Hospice services if he is deemed not to be a VAD candidate.   Code Status:  DNR  Prognosis:   < 6 months secondary to end stage heart failure, pulm HTN, 100 lbs wt loss, and patient's not wanting aggressive treatment.  Discharge Planning:  [place] for rehab with Palliative care service follow-up  Care plan was discussed with patient, HF team, [facility] Attending.  Thank you for allowing the Palliative Medicine Team to assist in the care of this patient.   Time In: 9:00 Time Out: 9:25 Total Time 25 Prolonged Time Billed no      Greater than  50%  of this time was spent counseling and coordinating care related to the above assessment and plan.  Melton Alar, PA-C  Please contact Palliative Medicine Team phone at [phone] for questions and concerns.

## 2016-03-09 NOTE — Progress Notes (Signed)
Advanced Heart Failure Rounding Note   Subjective:    Admitted with chest pain, dyspnea, and poor appetite.   On July 20th he presented to the ED with increased dyspnea. Creatinine elevated to 2.68.  Given IV fluids and torsemide, dig, spiro, and potassium stopped. Later discharged. Currently, creatinine back down to 1.18.   Yesterday had RHC as noted below.  Cardiac Surgery consulted for possible advanced therapies.   Complaining of fatigue. Denies SOB   RHC  03/08/16   RA mean 2 RV 32/2 PA 31/12, mean 20 PCWP mean 7 Oxygen saturations: PA 69% AO 99% Cardiac Output (Fick) 4.27  Cardiac Index (Fick) 2.15  RHC 02/25/16 RA mean 9 RV 39/10 PA 36/12, mean 24 PCWP mean 13 LV 79/18 AO 82/56 Oxygen saturations: PA 54% AO 96% Cardiac Output (Fick) 3.94  Cardiac Index (Fick) 1.89    Objective:   Weight Range:  Vital Signs:   Temp:  [97.5 F (36.4 C)-98 F (36.7 C)] 98 F (36.7 C) (07/25 2112) Pulse Rate:  [0-96] 96 (07/26 0848) Resp:  [0-62] 20 (07/25 2112) BP: (93-131)/(57-98) 98/57 (07/26 0848) SpO2:  [0 %-100 %] 98 % (07/25 2112) Last BM Date:  ("last week")  Weight change: Filed Weights   03/06/16 0002 03/07/16 1004 03/08/16 0454  Weight: 176 lb 1.6 oz (79.9 kg) 172 lb 6.4 oz (78.2 kg) 172 lb 1.6 oz (78.1 kg)    Intake/Output:   Intake/Output Summary (Last 24 hours) at 03/09/16 0919 Last data filed at 03/09/16 0500  Gross per 24 hour  Intake                0 ml  Output              750 ml  Net             -750 ml     Physical Exam: General:  Fatigued appearing. No resp difficulty. In bed  HEENT: normal Neck: supple. JVP 5-6 . Carotids 2+ bilat; no bruits. No lymphadenopathy or thryomegaly appreciated. Cor: PMI nondisplaced. Regular rate & rhythm. No rubs, or murmurs. + S3  Lungs: clear Abdomen: soft, nontender, nondistended. No hepatosplenomegaly. No bruits or masses. Good bowel sounds. Extremities: no cyanosis, clubbing, rash, edema Neuro:  alert & orientedx3, cranial nerves grossly intact. moves all 4 extremities w/o difficulty. Affect pleasant  Telemetry:  SR 90s   Labs: Basic Metabolic Panel:  Recent Labs Lab 03/05/16 1942 03/05/16 1947 03/06/16 0318 03/07/16 1557 03/08/16 1118 03/09/16 0254  NA 130* 133* 132* 130* 131* 131*  K 5.1 5.3* 4.8 4.5 4.6 4.3  CL 98* 97* 102 93* 94* 96*  CO2 24  --  GLUCOSE 104* 103* 89 97 96 95  BUN 36* 40* 31* 33* 39* 42*  CREATININE 1.40* 1.40* 1.18 1.41* 1.56* 1.39*  CALCIUM 10.0  --  9.8 10.3 10.4* 10.4*    Liver Function Tests:  Recent Labs Lab 03/03/16 1030 03/06/16 0318  AST 32 30  ALT 27 24  ALKPHOS 77 63  BILITOT 1.6* 1.1  PROT 9.1* 8.1  ALBUMIN 4.2 3.6   No results for input(s): LIPASE, AMYLASE in the last 168 hours. No results for input(s): AMMONIA in the last 168 hours.  CBC:  Recent Labs Lab 03/03/16 1030 03/05/16 1942 03/05/16 1947 03/06/16 0318 03/07/16 1557 03/08/16 1118  WBC 4.3 3.8*  --  3.5* 4.2 3.9*  NEUTROABS 2.4  --   --   --   --   --  HGB 15.0 14.9 16.3 14.1 15.2 15.3  HCT 45.0 44.7 48.0 43.1 45.5 45.8  MCV 91.8 92.2  --  92.3 91.9 91.6  PLT 259 279  --  249 283 281    Cardiac Enzymes:  Recent Labs Lab 03/03/16 1030 03/06/16 0012 03/06/16 0318 03/06/16 1001  TROPONINI 0.03* 0.04* 0.04* 0.04*    BNP: BNP (last 3 results)  Recent Labs  02/23/16 0836 03/03/16 1030 03/05/16 2229  BNP 2,021.0* 115.0* 759.5*    ProBNP (last 3 results) No results for input(s): PROBNP in the last 8760 hours.    Other results:  Imaging: Dg Orthopantogram  Result Date: 03/09/2016 CLINICAL DATA:  Preoperative evaluation to rule out surgical contraindication Dental caries ,, EXAM: ORTHOPANTOGRAM/PANORAMIC COMPARISON:  None. FINDINGS: There are numerous missing teeth. There are single remaining maxillary molars, 1 on each side. Single remaining maxillary incisor or lateral incisor noted on the left. There is a single left  remaining mandibular molar, with remaining incisors, lateral incisors and canines. Lucency in the left mandibular canine suggests a carie. There is no significant periapical lucency to suggest a periapical abscess. No fracture.  No bone lesion. IMPRESSION: 1. Multiple missing teeth.  No evidence of a root abscess. 2. Probable carie within the remaining the left mandibular canine. No other convincing caries. Electronically Signed   By: Amie Portland M.D.   On: 03/09/2016 08:26  US Thyroid  Result Date: 03/08/2016 CLINICAL DATA:  Hyperthyroidism. EXAM: THYROID ULTRASOUND TECHNIQUE: Ultrasound examination of the thyroid gland and adjacent soft tissues was performed. COMPARISON:  Chest CT 02/12/2016 FINDINGS: Right thyroid lobe Measurements: 4.3 x 2.4 x 1.3 cm. Hypoechoic nodule with a small echogenic focus in the superior right thyroid lobe measuring up to 0.3 cm. Right thyroid tissue is mildly heterogeneous. Left thyroid lobe Measurements: 3.6 x 2.7 x 1.3 cm. Left thyroid tissue is mildly heterogeneous without a discrete nodule. Isthmus Thickness: 0.4 cm.  No nodules visualized. Lymphadenopathy None visualized. IMPRESSION: Single small right thyroid nodule measuring up to 0.3 cm. Findings do not meet current SRU consensus criteria for biopsy. Follow-up by clinical exam is recommended. If patient has known risk factors for thyroid carcinoma, consider follow-up ultrasound in 12 months. If patient is clinically hyperthyroid, consider nuclear medicine thyroid uptake and scan.Reference: Management of Thyroid Nodules Detected at [place]: Society of Radiologists in Ultrasound Consensus Conference Statement. Radiology 2005; [id number]. Electronically Signed   By: Richarda Overlie M.D.   On: 03/08/2016 16:48    Medications:     Scheduled Medications: . atorvastatin  40 mg Oral Daily  . carvedilol  6.25 mg Oral BID WC  . digoxin  125 mcg Oral QODAY  . feeding supplement (ENSURE ENLIVE)  237 mL Oral BID BM  . Glycerin  (Adult)  1 suppository Rectal Once  . hydrALAZINE  25 mg Oral Q8H  . isosorbide mononitrate  30 mg Oral Daily  . levETIRAcetam  500 mg Oral Daily  . lisinopril  2.5 mg Oral Daily  . methimazole  40 mg Oral Daily  . mexiletine  150 mg Oral Q8H  . pantoprazole  40 mg Oral Daily  . polyethylene glycol  17 g Oral Daily  . senna-docusate  2 tablet Oral BID  . sodium chloride flush  3 mL Intravenous Q12H  . sodium chloride flush  3 mL Intravenous Q12H  . spironolactone  12.5 mg Oral Daily  . torsemide  10 mg Oral Daily  . Warfarin - Pharmacist Dosing Inpatient   Does not apply  q1800    Infusions:    PRN Medications: sodium chloride, sodium chloride, acetaminophen, albuterol, ipratropium, LORazepam, morphine injection, nitroGLYCERIN, ondansetron **OR** ondansetron (ZOFRAN) IV, ondansetron (ZOFRAN) IV, sodium chloride flush, sodium chloride flush   Assessment/Plan/Discussion  70 yo M w a h/o M w a h/o NICM s/p BiV ICD (09/2012, non-responder), AF, embolic CVA with persistent left-sided weakness (09/2014), COPD, PE (10/2014 s/p IVC filter later removed 05/2015), &  CKD3 recently admitted 02/23/16 to 03/01/16 following an ICD shock for VT for which he was started on Amiodarone. Also had LHC/RHC earlier this month with nonobstructive CAD, mildly elevated RV filling, and low output.   1. Chest Pain: Troponin 0.04>0.04>0.04. Suspect demand ischemia.  It think that his chest pain was muscular. LHC was ok earlier this month.  2. Acute/Chronic Systolic Heart Failure: St Jude CRT-D device, not set to BiV pace b/c ineffective.  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 NYHA IIIB. He does not appear volume overloaded.  - Volume status stable. No diuretics.   - Continue current dose of carvedilol for now. Continue lisinopril 2.5 mg daily.  Continue hydralazine 25 mg tid and imdur 30 mg daily.  Continue spironolactone 12.5. - Digoxin cut back to every other day.  - RHC - Adequate output. Low filling pressures.    3. H/O VT- VT shock 7/11. Has been amio 200 mg twice a day. Thyroid function abnormal suggesting hyperthyroidism. Stop amio.   Consult EP for alternatives for amio => mexiletine.  4. H/O PAF:  On coumadin. INR 2.4.  Pharmacy dosing coumadin  5. AKI: On  7/20 creatinine 2.68, though due to over-diuresis.  Creatinine now down to 1.39   6. H/O PE: On coumadin. INR 2.4  7. Hyperthyroidism: TSH 0.027 Free T3 Free 9.9  T4 5.3  Suspect iodine-triggered from amiodarone use.  Off amio.  EP consulted with recommendations for mexiletine 150 mg three times a day.   8. DNR- Palliative Care following for goals of care.  9. Thyroid Nodule-Noted on thyroid ultrasound  Dr Zenaida Niece trigt consulted today. VAD work started. Will need to make sure he has family support.  Per Dr Shirlee Latch- Would like to treat hyperthyroidism and obtain CPX. Will need outpatine Endocrinology follow up.    Length of Stay: 1  Amy Clegg NP-C  03/09/2016, 9:19 AM  Advanced Heart Failure Team Pager [phone] (M-F; 7a - 4p)  Please contact [facility] for night-coverage after hours (4p -7a ) and weekends on [email or web address]  Patient seen with NP, agree with the above note.  He is not volume overloaded on exam.  RHC this admission showed low filling pressures and low but not markedly low cardiac output.  I suspect that hyperthyroidism may have precipitated the fatigue and other symptoms that triggered this admission.  - At this point, would treat hyperthyroidism first (methimazole started).  Will reassess cardiac status when thyroid treated.  Will need CPX eventually.  May need LVAD down the road but not yet.  Will need social issues worked out.  - Hold diuretic again today, continue other meds.   - Mexiletine for VT.   [name] 03/09/2016 1:32 PM

## 2016-03-09 NOTE — Care Management Important Message (Signed)
Important Message  Patient Details  Name: Johnathan Morrison MRN: [id number] Date of Birth: August 20, 1945   Medicare Important Message Given:  Yes    Bernadette Hoit 03/09/2016, 1:13 PM

## 2016-03-09 NOTE — Progress Notes (Signed)
PT Cancellation Note  Patient Details Name: Johnathan Morrison MRN: [id number] DOB: Aug 29, 1945   Cancelled Treatment:    Reason Eval/Treat Not Completed: Medical issues which prohibited therapy.  New chest pain complaints with very low BP.  Will check later as time and pt can allow.   Ivar Drape 03/09/2016, 9:03 AM    Samul Dada, PT MS Acute Rehab Dept. Number: [facility] [id number] and [facility] [phone]

## 2016-03-10 DIAGNOSIS — E0590 Thyrotoxicosis, unspecified without thyrotoxic crisis or storm: Secondary | ICD-10-CM

## 2016-03-10 DIAGNOSIS — E43 Unspecified severe protein-calorie malnutrition: Secondary | ICD-10-CM

## 2016-03-10 DIAGNOSIS — R079 Chest pain, unspecified: Secondary | ICD-10-CM

## 2016-03-10 DIAGNOSIS — Z8669 Personal history of other diseases of the nervous system and sense organs: Secondary | ICD-10-CM

## 2016-03-10 LAB — BASIC METABOLIC PANEL
ANION GAP: 9 (ref 5–15)
BUN: 43 mg/dL — AB (ref 6–20)
CO2: 29 mmol/L (ref 22–32)
Calcium: 10.5 mg/dL — ABNORMAL HIGH (ref 8.9–10.3)
Chloride: 94 mmol/L — ABNORMAL LOW (ref 101–111)
Creatinine, Ser: 1.44 mg/dL — ABNORMAL HIGH (ref 0.61–1.24)
GFR calc Af Amer: 56 mL/min — ABNORMAL LOW (ref >60)
GFR, EST NON AFRICAN AMERICAN: 48 mL/min — AB (ref >60)
Glucose, Bld: 101 mg/dL — ABNORMAL HIGH (ref 65–99)
POTASSIUM: 4.6 mmol/L (ref 3.5–5.1)
SODIUM: 132 mmol/L — AB (ref 135–145)

## 2016-03-10 LAB — DIGOXIN LEVEL: DIGOXIN LVL: 0.4 ng/mL — AB (ref 0.8–2.0)

## 2016-03-10 LAB — PROTIME-INR
INR: 2.61
Prothrombin Time: 28.5 seconds — ABNORMAL HIGH (ref 11.4–15.2)

## 2016-03-10 LAB — PREALBUMIN: Prealbumin: 23.1 mg/dL (ref 18–38)

## 2016-03-10 MED ORDER — POLYETHYLENE GLYCOL 3350 17 G PO PACK: 17.0000 g | PACK | Freq: Every day | ORAL | 0 refills | Status: AC

## 2016-03-10 MED ORDER — MEXILETINE HCL 150 MG PO CAPS: 150.0000 mg | ORAL_CAPSULE | Freq: Three times a day (TID) | ORAL | 0 refills | Status: DC

## 2016-03-10 MED ORDER — LISINOPRIL 2.5 MG PO TABS: 2.5000 mg | ORAL_TABLET | Freq: Every day | ORAL | 0 refills | Status: DC

## 2016-03-10 MED ORDER — METHIMAZOLE 10 MG PO TABS: 40.0000 mg | ORAL_TABLET | Freq: Every day | ORAL | 0 refills | Status: DC

## 2016-03-10 MED ORDER — ENSURE ENLIVE PO LIQD: 237.0000 mL | Freq: Two times a day (BID) | ORAL | 0 refills | Status: DC

## 2016-03-10 MED ORDER — ACETAMINOPHEN 325 MG PO TABS: 650.0000 mg | ORAL_TABLET | ORAL | Status: AC | PRN

## 2016-03-10 NOTE — Plan of Care (Signed)
Problem: Acute Rehab PT Goals(only PT should resolve) Goal: Pt Will Ambulate With no LOB to L side during gait

## 2016-03-10 NOTE — Clinical Social Work Note (Addendum)
CSW called patient's daughter regarding SNF placement. She has already contacted [facility] to let them know referral will be coming. Decision still pending. Patient's daughter asked about becoming HCPOA. CSW explained that the chaplain handles that and will have RN put in order. Patient's daughter asked about letter from MD on letterhead to [employer] stating that patient is unable to live home alone so that he can get out of his contract with them. CSW explained that patient may not be at SNF that long with how well he is ambulating. She still wants letter. MD is considering VAD and if this happens, patient will be at SNF then hospital for 4-6 weeks, per daughter's report. Will page MD.   CSW called [facility] and left voicemail letting them know that referral had been sent.  Charlynn Court, CSW [phone]  1:10 pm CSW discussed letter with MD. We both believe it is too soon for the letter to get him out of his apartment contract but she said she would be more than happy to write it when the time comes.  Charlynn Court, CSW [phone]  2:52 pm CSW has left two voicemails for Marta Lamas, admissions coordinator at [facility]. CSW spoke with patient's daughter and gave her updates.  Charlynn Court, CSW [phone]  4:08 pm Patient asked CSW to come speak with him. He did not understand why no one had told him he was discharged. CSW apologized but explained that SNF has been an all-day process and just recently set in stone that he was going to [facility]. CSW apologized that CSW did not get to him sooner. Patient did not want to cancel placement and is fine with going to SNF. Patient stated that when he went to SNF, he only wanted to deal with this CSW. CSW explained that I do not work at [facility]. Patient expressed understanding. Patient stated that he is not upset with CSW.  Charlynn Court, CSW [phone]

## 2016-03-10 NOTE — Discharge Summary (Signed)
Physician Discharge Summary  Johnathan Morrison XJO:[id number] DOB: 1946-01-31 DOA: 03/05/2016  PCP: [facility]  Admit date: 03/05/2016 Discharge date: 03/10/2016  Admitted From: home  Disposition:  SNF  Recommendations for Outpatient Follow-up:  1. Follow up with PCP in 1-2 weeks 2. Please obtain BMP/CBC in one week 3. Daily weights and please call Cardiology office, Dr. Shirlee Latch if patient gains more than 5-lbs from the time of hospital discharge.  Equipment/Devices:  none  Discharge Condition:  Stable, improved CODE STATUS:  DNR Diet recommendation:  Healthy heart, 1.5L fluid restriction   Brief/Interim Summary:  Johnathan Morrison is an 70 y.o. male with medical history significant of a.fib, Bi V ICD, CKD, CVA, PAH, chronic systolic CHFnonischemic cardiomyopathy on EF 15-20%; who presented with complaints of left-sided chest pain and shortness of breath.  Discharge Diagnoses:  Principal Problem:   Chest pain Active Problems:   A-fib (HCC)   Chronic anticoagulation   HTN (hypertension)   History of seizure   Chronic renal disease, stage III   COPD (chronic obstructive pulmonary disease) (HCC)   NSVT (nonsustained ventricular tachycardia) (HCC)   Acute on chronic systolic congestive heart failure (HCC)   Goals of care, counseling/discussion   Palliative care encounter   Loss of weight   Protein-calorie malnutrition, severe   Severe protein-energy malnutrition (HCC)   Hyperthyroidism  Chest pain in a patient with a history of nonischemic cardiomyopathy and class IV heart failure/acute on chronic systolic CHF with AICD/pacemaker in situ/history of nonsustained V. tach with a shock earlier this month.  His worsening heart failure may be secondary to hyperthyroidism which was detected during this admission.   Patient has extensive cardiac history. His EF is 15-20 percent with severe LV dysfunction. Troponins were 0.043, with a flat trend. Had a recent left heart  catheterization which showed nonobstructive coronary disease. He had a right heart catheterization done 03/08/16 to evaluate right heart pressures and sample oxygen saturations. His hemodynamics were well compensated. He is being considered for a VAD, however he has significant social issues such as drug/alcohol abuse history, recent homelessness, and poor family support that may preclude this type of therapy.  As such, the palliative care team was consulted. Continue medical therapies including Imdur, Coreg, digoxin, hydralazine, spironolactone and Demadex.  His amiodarone was discontinued and he was transitioned to mexiletine due to his thyroid abnormalities.  Left chest pain, likely chest wall pain given pleuritic nature and reproducibility.  Atypical for ACS.  Troponins were flat.    Active Problems:   A-fib (HCC)/chronic anticoagulation Continue Coreg, Mexiletine, Coumadin and digoxin.    HTN (hypertension) Continue Coreg, hydralazine, lisinopril, spironolactone and Demadex.    History of seizure, remained seizure free Continued Keppra.    Chronic renal disease, stage III Monitor renal function. Baseline creatinine around 1.2. Creatinine slightly higher with diuresis.    COPD (chronic obstructive pulmonary disease) (HCC) Continue bronchodilators.    Loss of weight/Protein-calorie malnutrition, severe, Continue ensure supplements.    Hyperthyroidism Started methimazole. Thyroid ultrasound completed, single small right thyroid nodule 0.3 cm which did not meet criteria for biopsy. Consider repeat ultrasound in 12 months.  No steroids were started due to severe heart failure.  Follow up with Endocrinology within 2-3 weeks.     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  Social work consulted for assistance with placement  Discharge Instructions  Discharge Instructions    (HEART FAILURE PATIENTS) Call MD:  Anytime you have any of the  following symptoms: 1) 3 pound weight gain in 24 hours or 5 pounds in 1 week 2) shortness of breath, with or without a dry hacking cough 3) swelling in the hands, feet or stomach 4) if you have to sleep on extra pillows at night in order to breathe.    Complete by:  As directed   AMB Referral to HF Clinic    Complete by:  As directed   Call MD for:  difficulty breathing, headache or visual disturbances    Complete by:  As directed   Call MD for:  extreme fatigue    Complete by:  As directed   Call MD for:  hives    Complete by:  As directed   Call MD for:  persistant dizziness or light-headedness    Complete by:  As directed   Call MD for:  persistant nausea and vomiting    Complete by:  As directed   Call MD for:  severe uncontrolled pain    Complete by:  As directed   Call MD for:  temperature >100.4    Complete by:  As directed   Diet - low sodium heart healthy    Complete by:  As directed   Increase activity slowly    Complete by:  As directed       Medication List    STOP taking these medications   acetaminophen-codeine 300-30 MG tablet Commonly known as:  TYLENOL #3   amiodarone 200 MG tablet Commonly known as:  PACERONE     TAKE these medications   acetaminophen 325 MG tablet Commonly known as:  TYLENOL Take 2 tablets (650 mg total) by mouth every 4 (four) hours as needed for headache or mild pain. What changed:  medication strength  how much to take  when to take this   albuterol 108 (90 Base) MCG/ACT inhaler Commonly known as:  PROVENTIL HFA;VENTOLIN HFA Inhale 2 puffs into the lungs every 6 (six) hours as needed for wheezing or shortness of breath.   atorvastatin 40 MG tablet Commonly known as:  LIPITOR Take 40 mg by mouth every morning.   carvedilol 6.25 MG tablet Commonly known as:  COREG Take 1 tablet (6.25 mg total) by mouth 2 (two) times daily with a meal.   digoxin 0.125 MG tablet Commonly known as:  LANOXIN Take 125 mcg by mouth daily.    esomeprazole 20 MG capsule Commonly known as:  NEXIUM Take 20 mg by mouth daily at 12 noon.   feeding supplement (ENSURE ENLIVE) Liqd Take 237 mLs by mouth 2 (two) times daily between meals.   hydrALAZINE 25 MG tablet Commonly known as:  APRESOLINE Take 1 tablet (25 mg total) by mouth 3 (three) times daily. What changed:  when to take this   isosorbide mononitrate 30 MG 24 hr tablet Commonly known as:  IMDUR Take 1 tablet (30 mg total) by mouth daily.   levETIRAcetam 500 MG tablet Commonly known as:  KEPPRA Take 500 mg by mouth daily.   lisinopril 2.5 MG tablet Commonly known as:  PRINIVIL,ZESTRIL Take 1 tablet (2.5 mg total) by mouth daily. What changed:  when to take this   LORazepam 0.5 MG tablet Commonly known as:  ATIVAN Take 0.5 mg by mouth every 8 (eight) hours as needed for anxiety. Reported on 02/23/2016   methimazole 10 MG tablet Commonly known as:  TAPAZOLE Take 4 tablets (40 mg total) by mouth daily.   mexiletine 150 MG capsule Commonly known as:  MEXITIL  Take 1 capsule (150 mg total) by mouth every 8 (eight) hours.   nitroGLYCERIN 0.4 MG SL tablet Commonly known as:  NITROSTAT Place 1 tablet (0.4 mg total) under the tongue every 5 (five) minutes as needed for chest pain.   polyethylene glycol packet Commonly known as:  MIRALAX / GLYCOLAX Take 17 g by mouth daily.   potassium chloride 10 MEQ tablet Commonly known as:  K-DUR Take 20 mEq by mouth 2 (two) times daily.   spironolactone 25 MG tablet Commonly known as:  ALDACTONE Take 12.5 mg by mouth daily.   torsemide 20 MG tablet Commonly known as:  DEMADEX Take 10 mg by mouth daily.   warfarin 5 MG tablet Commonly known as:  COUMADIN Take 1 tablet (5 mg total) by mouth daily. What changed:  when to take this      Follow-up Information    [name], MD. Schedule an appointment as soon as possible for a visit in 1 week(s).   Specialty:  Cardiology Contact information: [address] [place] [address] [phone]        [facility] Follow up in 3 week(s).   Contact information: [address] [place] [address] [phone]        Carlus Pavlov, MD. Schedule an appointment as soon as possible for a visit in 3 week(s).   Specialty:  Internal Medicine Contact information: [address] [place] [address] [phone]          Allergies  Allergen Reactions  . 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    Consultations:  Cardiology, Dr. Shirlee Latch   Procedures/Studies: Dg Orthopantogram  Result Date: 03/09/2016 CLINICAL DATA:  Preoperative evaluation to rule out surgical contraindication Dental caries ,, EXAM: ORTHOPANTOGRAM/PANORAMIC COMPARISON:  None. FINDINGS: There are numerous missing teeth. There are single remaining maxillary molars, 1 on each side. Single remaining maxillary incisor or lateral incisor noted on the left. There is a single left remaining mandibular molar, with remaining incisors, lateral incisors and canines. Lucency in the left mandibular canine suggests a carie. There is no significant periapical lucency to suggest a periapical abscess. No fracture.  No bone lesion. IMPRESSION: 1. Multiple missing teeth.  No evidence of a root abscess. 2. Probable carie within the remaining the left mandibular canine. No other convincing caries. Electronically Signed   By: Amie Portland M.D.   On: 03/09/2016 08:26  Dg Chest 2 View  Result Date: 02/23/2016 CLINICAL DATA:  Shortness of breath for 4 days, worsened over the past 2 days. History of congestive heart failure. EXAM: CHEST  2 VIEW COMPARISON:  Single-view of the chest 02/03/2016. CT chest 02/12/2016. FINDINGS: There is cardiomegaly without pulmonary edema. Trace right pleural effusion is seen and there is mild subsegmental atelectasis in the right lung base. Aeration is markedly improved since  the prior plain film. AICD is noted. No pneumothorax. IMPRESSION: Cardiomegaly and pulmonary vascular congestion. Aeration appears markedly improved since the prior plain film. Trace right pleural effusion and subsegmental atelectasis right lung base. Electronically Signed   By: Drusilla Kanner M.D.   On: 02/23/2016 09:04   Ct Angio Chest Pe W/cm &/or Wo Cm  Result Date: 02/12/2016 CLINICAL DATA:  LEFT side chest pain with shortness of breath for 1 day, history hypertension, pacemaker, CHF EXAM: CT ANGIOGRAPHY CHEST WITH CONTRAST TECHNIQUE: Multidetector CT imaging of the chest was performed using the standard protocol during bolus  administration of intravenous contrast. Multiplanar CT image reconstructions and MIPs were obtained to evaluate the vascular anatomy. CONTRAST:  100 cc Isovue 370 IV COMPARISON:  None FINDINGS: Cardiovascular: Few scattered atherosclerotic calcifications aorta and coronary arteries. Upper normal caliber ascending thoracic aorta 3.8 cm diameter. Mild limitations of exam secondary to scattered respiratory motion artifacts. Enlarged central pulmonary arteries. Pulmonary arteries well opacified and grossly patent. No definite evidence of pulmonary embolism. Beam hardening artifacts from LEFT subclavian pacemaker leads in RIGHT atrium, RIGHT ventricle, and coronary sinus. Enlargement of cardiac chambers and cardiac size. Mediastinum/Nodes: Esophagus unremarkable. Scattered normal size mediastinal lymph nodes. Lungs/Pleura: Small RIGHT pleural effusion. Scattered respiratory motion artifacts. Minimal compressive atelectasis RIGHT lower lobe. Peripheral interstitial changes at both lung bases. No additional infiltrate, pneumothorax, or LEFT pleural effusion. Upper Abdomen: Distended IVC and hepatic veins with reflux of contrast question elevated RIGHT heart pressure versus sequela power injection. Remaining upper abdomen normal. Musculoskeletal: No acute osseous findings. Review of the MIP  images confirms the above findings. IMPRESSION: No gross evidence of pulmonary embolism identified on exam slightly limited by rash to motion artifacts. Question pulmonary arterial hypertension. Small RIGHT pleural effusion with minimal basilar atelectasis. Enlargement of cardiac chambers with evidence of prior pacemaker placement. Electronically Signed   By: Ulyses Southward M.D.   On: 02/12/2016 14:42   Dg Chest Portable 1 View  Result Date: 03/05/2016 CLINICAL DATA:  Chest pain x2 days EXAM: PORTABLE CHEST 1 VIEW COMPARISON:  03/03/2016 FINDINGS: Increased interstitial markings, chronic. No frank interstitial edema. No pleural effusion or pneumothorax. Cardiomegaly.  Left subclavian pacemaker. IMPRESSION: No evidence of acute cardiopulmonary disease. Electronically Signed   By: Charline Bills M.D.   On: 03/05/2016 19:55   Dg Chest Portable 1 View  Result Date: 03/03/2016 CLINICAL DATA:  Shortness of Breath EXAM: PORTABLE CHEST 1 VIEW COMPARISON:  February 27, 2016 FINDINGS: There is patchy infiltrate in the right lower lobe region. Lungs elsewhere clear. There is moderate cardiomegaly with pulmonary vascularity within normal limits. There is atherosclerotic calcification in the aortic arch. Pacemaker lead tips are attached to the right atrium, right ventricle, and left ventricle, stable. No adenopathy. No bone lesions. IMPRESSION: Patchy infiltrate right base. Lungs elsewhere clear. Stable cardiomegaly. Pacemaker positions unchanged. Aortic atherosclerosis. Followup PA and lateral chest radiographs recommended in 3-4 weeks following trial of antibiotic therapy to ensure resolution and exclude underlying malignancy. Electronically Signed   By: Bretta Bang III M.D.   On: 03/03/2016 10:46   Dg Chest Port 1 View  Result Date: 02/27/2016 CLINICAL DATA:  CHF. EXAM: PORTABLE CHEST 1 VIEW COMPARISON:  02/23/2016. FINDINGS: 1018 hours. The lungs are clear wiithout focal pneumonia, edema, pneumothorax or  pleural effusion. The cardio pericardial silhouette is enlarged. Left permanent pacemaker remains in place. The visualized bony structures of the thorax are intact. Telemetry leads overlie the chest. IMPRESSION: Interval improvement in vascular congestion.  No acute findings. Electronically Signed   By: Kennith Center M.D.   On: 02/27/2016 15:09   US Thyroid  Result Date: 03/08/2016 CLINICAL DATA:  Hyperthyroidism. EXAM: THYROID ULTRASOUND TECHNIQUE: Ultrasound examination of the thyroid gland and adjacent soft tissues was performed. COMPARISON:  Chest CT 02/12/2016 FINDINGS: Right thyroid lobe Measurements: 4.3 x 2.4 x 1.3 cm. Hypoechoic nodule with a small echogenic focus in the superior right thyroid lobe measuring up to 0.3 cm. Right thyroid tissue is mildly heterogeneous. Left thyroid lobe Measurements: 3.6 x 2.7 x 1.3 cm. Left thyroid tissue is mildly heterogeneous without a discrete  nodule. Isthmus Thickness: 0.4 cm.  No nodules visualized. Lymphadenopathy None visualized. IMPRESSION: Single small right thyroid nodule measuring up to 0.3 cm. Findings do not meet current SRU consensus criteria for biopsy. Follow-up by clinical exam is recommended. If patient has known risk factors for thyroid carcinoma, consider follow-up ultrasound in 12 months. If patient is clinically hyperthyroid, consider nuclear medicine thyroid uptake and scan.Reference: Management of Thyroid Nodules Detected at [place]: Society of Radiologists in Ultrasound Consensus Conference Statement. Radiology 2005; [id number]. Electronically Signed   By: Richarda Overlie M.D.   On: 03/08/2016 16:48   Subjective:  Ongoing pleuritic left chest pains worse with talking.    Discharge Exam: Vitals:   03/10/16 0952 03/10/16 1212  BP:  (!) 91/56  Pulse: 87 81  Resp:  18  Temp:  98.1 F (36.7 C)   Vitals:   03/09/16 2100 03/10/16 0607 03/10/16 0952 03/10/16 1212  BP: 95/61 91/65  (!) 91/56  Pulse: 80 80 87 81  Resp: 18 19  18   Temp: 97.6 F  (36.4 C) 97.6 F (36.4 C)  98.1 F (36.7 C)  TempSrc: Oral Oral  Oral  SpO2: 98% 99%  96%  Weight:  76.5 kg (168 lb 11.2 oz)    Height:        General: Pt is alert, awake, not in acute distress Cardiovascular: RRR, positive S3 gallop, paced rhythm on telemetry Respiratory: CTA bilaterally, no wheezing, no rhonchi Abdominal: Soft, NT, ND, bowel sounds + Extremities: no edema, no cyanosis    The results of significant diagnostics from this hospitalization (including imaging, microbiology, ancillary and laboratory) are listed below for reference.     Microbiology: Recent Results (from the past 240 hour(s))  Culture, blood (routine x 2)     Status: None   Collection Time: 03/03/16 12:15 PM  Result Value Ref Range Status   Specimen Description BLOOD RIGHT ANTECUBITAL  Final   Special Requests BOTTLES DRAWN AEROBIC AND ANAEROBIC 5CC EACH  Final   Culture NO GROWTH 5 DAYS  Final   Report Status 03/08/2016 FINAL  Final  Culture, blood (routine x 2)     Status: None   Collection Time: 03/03/16 12:25 PM  Result Value Ref Range Status   Specimen Description BLOOD RIGHT FOREARM  Final   Special Requests BOTTLES DRAWN AEROBIC AND ANAEROBIC 5CC EACH  Final   Culture NO GROWTH 5 DAYS  Final   Report Status 03/08/2016 FINAL  Final     Labs: BNP (last 3 results)  Recent Labs  02/23/16 0836 03/03/16 1030 03/05/16 2229  BNP 2,021.0* 115.0* 759.5*   Basic Metabolic Panel:  Recent Labs Lab 03/06/16 0318 03/07/16 1557 03/08/16 1118 03/09/16 0254 03/10/16 0707  NA 132* 130* 131* 131* 132*  K 4.8 4.5 4.6 4.3 4.6  CL 102 93* 94* 96* 94*  CO2 23 25 27 25 29   GLUCOSE 89 97 96 95 101*  BUN 31* 33* 39* 42* 43*  CREATININE 1.18 1.41* 1.56* 1.39* 1.44*  CALCIUM 9.8 10.3 10.4* 10.4* 10.5*   Liver Function Tests:  Recent Labs Lab 03/06/16 0318  AST 30  ALT 24  ALKPHOS 63  BILITOT 1.1  PROT 8.1  ALBUMIN 3.6   No results for input(s): LIPASE, AMYLASE in the last 168  hours. No results for input(s): AMMONIA in the last 168 hours. CBC:  Recent Labs Lab 03/05/16 1942 03/05/16 1947 03/06/16 0318 03/07/16 1557 03/08/16 1118  WBC 3.8*  --  3.5* 4.2 3.9*  HGB 14.9  16.3 14.1 15.2 15.3  HCT 44.7 48.0 43.1 45.5 45.8  MCV 92.2  --  92.3 91.9 91.6  PLT 279  --  249 283 281   Cardiac Enzymes:  Recent Labs Lab 03/06/16 0012 03/06/16 0318 03/06/16 1001  TROPONINI 0.04* 0.04* 0.04*   Lab Results  Component Value Date   INR 2.61 03/10/2016   INR 2.41 03/09/2016   INR 3.02 (H) 03/08/2016     Urinalysis    Component Value Date/Time   COLORURINE YELLOW 03/06/2016 2055   APPEARANCEUR CLEAR 03/06/2016 2055   LABSPEC 1.015 03/06/2016 2055   PHURINE 5.0 03/06/2016 2055   GLUCOSEU NEGATIVE 03/06/2016 2055   HGBUR NEGATIVE 03/06/2016 2055   BILIRUBINUR NEGATIVE 03/06/2016 2055   KETONESUR NEGATIVE 03/06/2016 2055   PROTEINUR NEGATIVE 03/06/2016 2055   NITRITE NEGATIVE 03/06/2016 2055   LEUKOCYTESUR NEGATIVE 03/06/2016 2055   Sepsis Labs Invalid input(s): PROCALCITONIN,  WBC,  LACTICIDVEN   Time coordinating discharge: Over 30 minutes  SIGNED:   Renae Fickle, MD  [facility] 03/10/2016, 1:27 PM Pager   If 7PM-7AM, please contact night-coverage [email or web address] Password [email or web address]

## 2016-03-10 NOTE — Progress Notes (Signed)
Daily Progress Note   Patient Name: Johnathan Morrison       Date: 03/10/2016 DOB: [birth year]  Age: 70 y.o. MRN#: [id number] Attending Physician: Renae Fickle, MD Primary Care Physician: [facility] Admit Date: 03/05/2016  Reason for Consultation/Follow-up: Establishing goals of care  Subjective: Patient quiet this morning.  States he had an argument with his daughter over his apartment and he doesn't want to speak to her anymore.  He asked her for her contact information at work and she would not give it to him.  He understands that consideration for VAD is on hold until his thyroid has stabilized.  He is choosing a rehab facility states he would like to be in [place].  No complaints of SOB or CP this am.  I will give him a letter listing date of admission and likely discharge to SNF.  Length of Stay: 2  Current Medications: Scheduled Meds:  . atorvastatin  40 mg Oral Daily  . carvedilol  6.25 mg Oral BID WC  . digoxin  125 mcg Oral QODAY  . feeding supplement (ENSURE ENLIVE)  237 mL Oral BID BM  . Glycerin (Adult)  1 suppository Rectal Once  . hydrALAZINE  25 mg Oral Q8H  . isosorbide mononitrate  30 mg Oral Daily  . levETIRAcetam  500 mg Oral Daily  . lisinopril  2.5 mg Oral Daily  . methimazole  40 mg Oral Daily  . mexiletine  150 mg Oral Q8H  . pantoprazole  40 mg Oral Daily  . polyethylene glycol  17 g Oral Daily  . senna-docusate  2 tablet Oral BID  . sodium chloride flush  3 mL Intravenous Q12H  . sodium chloride flush  3 mL Intravenous Q12H  . spironolactone  12.5 mg Oral Daily  . torsemide  10 mg Oral Daily  . Warfarin - Pharmacist Dosing Inpatient   Does not apply q1800    Continuous Infusions:    PRN Meds: sodium chloride, sodium chloride,  acetaminophen, albuterol, ipratropium, LORazepam, morphine injection, nitroGLYCERIN, ondansetron **OR** ondansetron (ZOFRAN) IV, sodium chloride flush, sodium chloride flush, zolpidem  Physical Exam      Thin pleasant male, A&O, calls me by name. Resp:  NAD Cv:  irreg Abdomen soft, thin Ext.  Able to move all 4   Vital Signs: BP 91/65 (BP  Location: Right Arm)   Pulse 80   Temp 97.6 F (36.4 C) (Oral)   Resp 19   Ht 6' (1.829 m)   Wt 76.5 kg (168 lb 11.2 oz)   SpO2 99%   BMI 22.88 kg/m  SpO2: SpO2: 99 % O2 Device: O2 Device: Not Delivered O2 Flow Rate: O2 Flow Rate (L/min): 2 L/min  Intake/output summary:   Intake/Output Summary (Last 24 hours) at 03/10/16 0801 Last data filed at 03/10/16 0962  Gross per 24 hour  Intake              480 ml  Output              500 ml  Net              -20 ml   LBM: Last BM Date: 03/09/16 Baseline Weight: Weight: 78.9 kg (174 lb) Most recent weight: Weight: 76.5 kg (168 lb 11.2 oz)       Palliative Assessment/Data:    Flowsheet Rows   Flowsheet Row Most Recent Value  Intake Tab  Referral Department  Cardiology  Unit at Time of Referral  Cardiac/Telemetry Unit  Palliative Care Primary Diagnosis  Cardiac  Date Notified  03/06/16  Palliative Care Type  New Palliative care  Reason for referral  Clarify Goals of Care  Date of Admission  03/05/16  Date first seen by Palliative Care  03/06/16  # of days Palliative referral response time  0 Day(s)  # of days IP prior to Palliative referral  1  Clinical Assessment  Palliative Performance Scale Score  40%  Pain Max last 24 hours  6  Pain Min Last 24 hours  3  Psychosocial & Spiritual Assessment  Palliative Care Outcomes      Patient Active Problem List   Diagnosis Date Noted  . Protein-calorie malnutrition, severe 03/08/2016  . Severe protein-energy malnutrition (HCC) 03/08/2016  . Hyperthyroidism   . Goals of care, counseling/discussion   . Palliative care encounter   . Loss  of weight   . Chest pain 03/05/2016  . NSVT (nonsustained ventricular tachycardia) (HCC) 02/24/2016  . Chronic renal disease, stage III   . Diastolic dysfunction   . Pulmonary HTN - Rt heart cath June 2016   . COPD (chronic obstructive pulmonary disease) (HCC)   . History of PE-March 2016   . Cardiomyopathy-presumably NICM   . Acute on chronic systolic congestive heart failure (HCC)   . A-fib (HCC) 02/23/2016  . Chronic anticoagulation 02/23/2016  . 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 HTN (hypertension) 02/23/2016  . History of seizure 02/23/2016  . Acute on chronic systolic (congestive) heart failure (HCC) 02/23/2016  . 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 SAH (subarachnoid hemorrhage)-Dec 2015 07/15/2014    Palliative Care Assessment & Plan   Patient Profile: 70 y.o. male  with past medical history of SAH, CVA with residual left sided weakness, seizures, pulmonary HTN, COPD, systolic HF with EF of 15 - 20%,  CKD stage 3, and afib, who was admitted on 03/05/2016 with chest pain and SOB.  Mr. Braverman had a recent admission from 7/11 - 7/18 after being shocked by his ICD secondary to multiple episodes of V Tach.  During that hospitalization he had a cardiac cath that showed non-obstructive cardiomyopathy.  During the current hospitalization he was found to be hyperthyroid with a TSH of 0.027.  This was felt to possibly be due to amiodarone and consequently it has been stopped.  A repeat RHC was performed on 7/25.  Cardiac index was greater than 2.0.  Patient was not felt to be a good candidate for milrinone therapy at this time.  The recommendation was to treat his thyroid function.  Assessment: 70 yo male s/p SAH, CVA with residual left sided weakness and end stage heart failure.   He seems simple in his thought process - "What should I do?  I'll do what you tell me to do?".  His family support is not strong.     He's lost 100+ lbs in the past year.  Currently has  hyperthyroidism possibly secondary to amio.  Amio discontinued.  Cardiology has completed a second heart cath and made their recommendations.  Consideration is being given to VAD placement.  Recommendations/Plan:  DNR  Physical therapy evaluation  Treatment of the Thyroid  D/C to SNF for acute rehab with Palliative to follow (PLease write an order in DC summary for Palliative to follow at SNF)  After acute rehab is finished I strongly recommend reassessment of appropriateness for Hospice services if he is deemed not to be a VAD candidate.   Code Status:  DNR  Prognosis:  < 6 months secondary to end stage heart failure, pulm HTN, 100 lbs wt loss.  This prognosis could be significantly different if it turns out he is a candidate to receive a VAD.  Discharge Planning:  Skilled Nursing Facility for rehab with Palliative care service follow-up  Care plan was discussed with patient and son [name].  Thank you for allowing the Palliative Medicine Team to assist in the care of this patient.   Time In: 7:45 Time Out: 8:05 Total Time 20 Prolonged Time Billed no      Greater than 50%  of this time was spent counseling and coordinating care related to the above assessment and plan.  Stephani Police, PA-C  Please contact Palliative Medicine Team phone at [phone] for questions and concerns.

## 2016-03-10 NOTE — Clinical Social Work Placement (Signed)
   CLINICAL SOCIAL WORK PLACEMENT  NOTE  Date:  03/10/2016  Patient Details  Name: Johnathan Morrison MRN: [id number] Date of Birth: 15-Feb-1946  Clinical Social Work is seeking post-discharge placement for this patient at the Skilled  Nursing Facility level of care (*CSW will initial, date and re-position this form in  chart as items are completed):  Yes   Patient/family provided with [facility] Clinical Social Work Department's list of facilities offering this level of care within the geographic area requested by the patient (or if unable, by the patient's family).  Yes   Patient/family informed of their freedom to choose among providers that offer the needed level of care, that participate in Medicare, Medicaid or managed care program needed by the patient, have an available bed and are willing to accept the patient.  Yes   Patient/family informed of [facility]'s ownership interest in [facility] and [facility], as well as of the fact that they are under no obligation to receive care at these facilities.  PASRR submitted to EDS on 03/10/16     PASRR number received on 03/10/16     Existing PASRR number confirmed on       FL2 transmitted to all facilities in geographic area requested by pt/family on 03/10/16     FL2 transmitted to all facilities within larger geographic area on       Patient informed that his/her managed care company has contracts with or will negotiate with certain facilities, including the following:        Yes   Patient/family informed of bed offers received.  Patient chooses bed at [facility]     Physician recommends and patient chooses bed at      Patient to be transferred to [facility] at [place] on 03/10/16.  Patient to be transferred to facility by [facility]     Patient family notified on 03/10/16 of transfer.  Name of family member notified:  Senetra     PHYSICIAN Please prepare prescriptions     Additional Comment:     _______________________________________________ Margarito Liner, LCSW 03/10/2016, 3:06 PM

## 2016-03-10 NOTE — NC FL2 (Signed)
[place] MEDICAID FL2 LEVEL OF CARE SCREENING TOOL     IDENTIFICATION  Patient Name: Johnathan Morrison Birthdate: Nov 05, 1945 Sex: male Admission Date (Current Location): 03/05/2016  [facility] and [place] Number:  [employer] and Address:  [facility], [address], [place], [place] [address]      Provider Number: [id number]  Attending Physician Name and Address:  Renae Fickle, MD  Relative Name and Phone Number:       Current Level of Care: Hospital Recommended Level of Care: Skilled Nursing Facility (With palliative) Prior Approval Number:    Date Approved/Denied:   PASRR Number: [id number]  Discharge Plan: SNF    Current Diagnoses: Patient Active Problem List   Diagnosis Date Noted  . Protein-calorie malnutrition, severe 03/08/2016  . Severe protein-energy malnutrition (HCC) 03/08/2016  . Hyperthyroidism   . Goals of care, counseling/discussion   . Palliative care encounter   . Loss of weight   . Chest pain 03/05/2016  . NSVT (nonsustained ventricular tachycardia) (HCC) 02/24/2016  . Chronic renal disease, stage III   . Diastolic dysfunction   . Pulmonary HTN - Rt heart cath June 2016   . COPD (chronic obstructive pulmonary disease) (HCC)   . History of PE-March 2016   . Cardiomyopathy-presumably NICM   . Acute on chronic systolic congestive heart failure (HCC)   . A-fib (HCC) 02/23/2016  . Chronic anticoagulation 02/23/2016  . 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 HTN (hypertension) 02/23/2016  . History of seizure 02/23/2016  . Acute on chronic systolic (congestive) heart failure (HCC) 02/23/2016  . 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 SAH (subarachnoid hemorrhage)-Dec 2015 07/15/2014    Orientation RESPIRATION BLADDER Height & Weight     Self, Time, Situation, Place  Normal Continent Weight: 168 lb 11.2 oz (76.5 kg) Height:  6' (182.9 cm)  BEHAVIORAL SYMPTOMS/MOOD NEUROLOGICAL BOWEL  NUTRITION STATUS   (None) Convulsions/Seizures Continent Diet (Heart healthy)  AMBULATORY STATUS COMMUNICATION OF NEEDS Skin   Limited Assist Verbally Normal                       Personal Care Assistance Level of Assistance  Bathing, Feeding, Dressing Bathing Assistance: Independent Feeding assistance: Independent Dressing Assistance: Independent     Functional Limitations Info  Sight, Hearing, Speech Sight Info: Adequate Hearing Info: Adequate Speech Info: Adequate    SPECIAL CARE FACTORS FREQUENCY  PT (By licensed PT), Blood pressure     PT Frequency: 3 x week              Contractures Contractures Info: Not present    Additional Factors Info  Code Status, Allergies Code Status Info: DNR Allergies Info: Amiodarone           Current Medications (03/10/2016):  This is the current hospital active medication list Current Facility-Administered Medications  Medication Dose Route Frequency Provider Last Rate Last Dose  . 0.9 %  sodium chloride infusion  250 mL Intravenous PRN Laurey Morale, MD      . 0.9 %  sodium chloride infusion  250 mL Intravenous PRN Laurey Morale, MD      . acetaminophen (TYLENOL) tablet 650 mg  650 mg Oral Q4H PRN Laurey Morale, MD      . albuterol (PROVENTIL) (2.5 MG/3ML) 0.083% nebulizer solution 2.5 mg  2.5 mg Nebulization Q4H PRN Clydie Braun, MD      . atorvastatin (LIPITOR) tablet 40 mg  40 mg Oral Daily Clydie Braun, MD   40 mg at 03/10/16 0950  . carvedilol (COREG) tablet 6.25 mg  6.25 mg Oral BID WC Rondell Burtis Junes, MD   6.25 mg at 03/10/16 0950  . digoxin (LANOXIN) tablet 125 mcg  125 mcg Oral QODAY Julaine Hua, MD   125 mcg at 03/10/16 [phone]  . feeding supplement (ENSURE ENLIVE) (ENSURE ENLIVE) liquid 237 mL  237 mL Oral BID BM Kathlen Mody, MD   237 mL at 03/09/16 1000  . Glycerin (Adult) 2.1 g suppository 1 suppository  1 suppository Rectal Once Stephani Police, PA-C      . hydrALAZINE (APRESOLINE) tablet 25 mg   25 mg Oral Q8H Rondell Burtis Junes, MD   25 mg at 03/10/16 0626  . ipratropium (ATROVENT) nebulizer solution 0.5 mg  0.5 mg Nebulization Q4H PRN Clydie Braun, MD      . isosorbide mononitrate (IMDUR) 24 hr tablet 30 mg  30 mg Oral Daily Clydie Braun, MD   30 mg at 03/10/16 0951  . levETIRAcetam (KEPPRA) tablet 500 mg  500 mg Oral Daily Clydie Braun, MD   500 mg at 03/10/16 0952  . lisinopril (PRINIVIL,ZESTRIL) tablet 2.5 mg  2.5 mg Oral Daily Amy D Clegg, NP   2.5 mg at 03/10/16 0950  . LORazepam (ATIVAN) tablet 0.5 mg  0.5 mg Oral Q8H PRN Clydie Braun, MD      . methimazole (TAPAZOLE) tablet 40 mg  40 mg Oral Daily Kathlen Mody, MD   40 mg at 03/09/16 1000  . mexiletine (MEXITIL) capsule 150 mg  150 mg Oral Q8H Will Jorja Loa, MD   150 mg at 03/10/16 0600  . morphine 2 MG/ML injection 1-2 mg  1-2 mg Intravenous Q4H PRN Kathlen Mody, MD   2 mg at 03/07/16 1929  . nitroGLYCERIN (NITROSTAT) SL tablet 0.4 mg  0.4 mg Sublingual Q5 min PRN Kathlen Mody, MD   0.4 mg at 03/09/16 0844  . ondansetron (ZOFRAN) tablet 4 mg  4 mg Oral Q6H PRN Clydie Braun, MD       Or  . ondansetron (ZOFRAN) injection 4 mg  4 mg Intravenous Q6H PRN Rondell A Katrinka Blazing, MD      . pantoprazole (PROTONIX) EC tablet 40 mg  40 mg Oral Daily Clydie Braun, MD   40 mg at 03/10/16 0950  . polyethylene glycol (MIRALAX / GLYCOLAX) packet 17 g  17 g Oral Daily Kathlen Mody, MD   17 g at 03/08/16 1523  . senna-docusate (Senokot-S) tablet 2 tablet  2 tablet Oral BID Kathlen Mody, MD   2 tablet at 03/09/16 1000  . sodium chloride flush (NS) 0.9 % injection 3 mL  3 mL Intravenous Q12H Laurey Morale, MD   3 mL at 03/10/16 1000  . sodium chloride flush (NS) 0.9 % injection 3 mL  3 mL Intravenous PRN Laurey Morale, MD      . sodium chloride flush (NS) 0.9 % injection 3 mL  3 mL Intravenous Q12H Laurey Morale, MD   3 mL at 03/10/16 1000  . sodium chloride flush (NS) 0.9 % injection 3 mL  3 mL Intravenous PRN Laurey Morale,  MD      . spironolactone (ALDACTONE) tablet 12.5 mg  12.5 mg Oral Daily Clydie Braun, MD   12.5 mg at 03/10/16 0950  . torsemide (DEMADEX) tablet 10 mg  10 mg Oral Daily Rondell A  Katrinka Blazing, MD   10 mg at 03/10/16 0951  . Warfarin - Pharmacist Dosing Inpatient   Does not apply q1800 Rondell A Katrinka Blazing, MD      . zolpidem (AMBIEN) tablet 5 mg  5 mg Oral QHS PRN Leda Gauze, NP   5 mg at 03/09/16 2213     Discharge Medications: Please see discharge summary for a list of discharge medications.  Relevant Imaging Results:  Relevant Lab Results:   Additional Information SS#: [id number]  Margarito Liner, LCSW

## 2016-03-10 NOTE — Progress Notes (Signed)
Pt being discharged to [facility]. Report called. Information about CHF clinic appt for Aug 4 at 10:30 am provided to [facility]. Pt d/c via transport in stretcher.

## 2016-03-10 NOTE — Clinical Social Work Note (Signed)
CSW facilitated patient discharge including contacting patient family and facility to confirm patient discharge plans. Clinical information faxed to facility and family agreeable with plan. CSW arranged ambulance transport via [facility] to [facility]. RN to call report prior to discharge [phone]).  CSW will sign off for now as social work intervention is no longer needed. Please consult [place] again if new needs arise.  Charlynn Court, CSW [phone]

## 2016-03-10 NOTE — Progress Notes (Signed)
Pt refusing AM labs at this time despite staff encouragement. Labs rescheduled for 07:30 AM. Will continue to monitor.

## 2016-03-10 NOTE — Progress Notes (Signed)
ANTICOAGULATION CONSULT NOTE Pharmacy Consult for Coumadin Indication: atrial fibrillation  Allergies  Allergen Reactions  . 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    Patient Measurements: Height: 6' (182.9 cm) Weight: 168 lb 11.2 oz (76.5 kg) IBW/kg (Calculated) : 77.6  Vital Signs: Temp: 97.6 F (36.4 C) (07/27 0607) Temp Source: Oral (07/27 0607) BP: 91/65 (07/27 0607) Pulse Rate: 87 (07/27 0952)  Labs:  Recent Labs  03/07/16 1557 03/08/16 0340 03/08/16 1118 03/09/16 0254 03/10/16 0707  HGB 15.2  --  15.3  --   --   HCT 45.5  --  45.8  --   --   PLT 283  --  281  --   --   LABPROT 32.1* 30.7*  --  26.0* 28.5*  INR 3.20* 3.02*  --  2.41 2.61  CREATININE 1.41*  --  1.56* 1.39* 1.44*    Estimated Creatinine Clearance: 53.1 mL/min (by C-G formula based on SCr of 1.44 mg/dL).  Assessment: 70 y.o. male admitted with chest pain, h/o Afib, to continue Coumadin.  INR 3.7  elevated on admission trended down and restarted 7/25.  INR 2.6 - restart home dose - off amiodarone so may have increased requirements   PTA Coumadin dose 5 mg daily  Goal of Therapy:  INR 2-3 Monitor platelets by anticoagulation protocol: Yes   Plan:   Coumadin  5 mg daily Daily INR  Leota Sauers Pharm.D. CPP, BCPS Clinical Pharmacist [phone] 03/10/2016 10:16 AM

## 2016-03-10 NOTE — Evaluation (Signed)
Physical Therapy Evaluation Patient Details Name: Johnathan Morrison MRN: [id number] DOB: 09/22/1945 Today's Date: 03/10/2016   History of Present Illness  70 yo male with admission for chest pain and elevated troponin was noted to have non obstructive CAD, CHF acute on chronic and non ischemic cardiomyopathy.  New chest pain is ischemic in nature.  PMHx:  cardioversion, v tach, EF 15-20%, a-fib, CKD 3, COPD, SAH with L hemiparesis  Clinical Impression  Pt is unsafe on hallway with HHA due to listing to L despite his ability to catch himself.  He is clearly noting both chest pain issues which nursing cleared for the walk but also dizziness with no changes observed on vitals with therapy.  He is appropriate to continue acutely and may be better able to get directly home if PT sees him.  If no changes in his safety with gait are observed then will need to persist with with SNF plan.    Follow Up Recommendations SNF    Equipment Recommendations  None recommended by PT    Recommendations for Other Services Rehab consult     Precautions / Restrictions Precautions Precautions: Fall (telemetry, chest pain) Precaution Comments: ischemic demand chest pain Restrictions Weight Bearing Restrictions: No      Mobility  Bed Mobility Overal bed mobility: Modified Independent                Transfers Overall transfer level: Modified independent                  Ambulation/Gait Ambulation/Gait assistance: Min guard Ambulation Distance (Feet): 300 Feet Assistive device: 1 person hand held assist Gait Pattern/deviations: Step-through pattern;Decreased stride length;Trunk flexed;Wide base of support;Drifts right/left (L side LOB that he can correct) Gait velocity: reduced Gait velocity interpretation: Below normal speed for age/gender General Gait Details: L hemiparesis affecting LOB  Stairs            Wheelchair Mobility    Modified Rankin (Stroke Patients Only)       Balance Overall balance assessment: Needs assistance Sitting-balance support: Feet supported Sitting balance-Leahy Scale: Good     Standing balance support: Single extremity supported Standing balance-Leahy Scale: Fair Standing balance comment: less than fair dynamically at times                             Pertinent Vitals/Pain Pain Assessment: 0-10 Pain Score: 7  Pain Location: chest pain Pain Intervention(s): Monitored during session;Limited activity within patient's tolerance;Premedicated before session    Home Living Family/patient expects to be discharged to:: Skilled nursing facility Living Arrangements: Alone                    Prior Function Level of Independence: Independent               Hand Dominance   Dominant Hand: Right    Extremity/Trunk Assessment   Upper Extremity Assessment: Overall WFL for tasks assessed           Lower Extremity Assessment: LLE deficits/detail   LLE Deficits / Details: mild weakness due to previous subarachnoid hemhorrage  Cervical / Trunk Assessment: Normal  Communication   Communication: No difficulties  Cognition Arousal/Alertness: Awake/alert Behavior During Therapy: WFL for tasks assessed/performed Overall Cognitive Status: No family/caregiver present to determine baseline cognitive functioning       Memory: Decreased short-term memory;Decreased recall of precautions  General Comments General comments (skin integrity, edema, etc.): Pt is up in his room at times with no assistance but describes dizziness.  Has been able to move with PT to walk but should have nursing assist him which he agrees to do.    Exercises        Assessment/Plan    PT Assessment Patient needs continued PT services  PT Diagnosis Difficulty walking;Abnormality of gait;Hemiplegia non-dominant side   PT Problem List Decreased strength;Decreased range of motion;Decreased activity  tolerance;Decreased balance;Decreased mobility;Decreased cognition;Decreased coordination;Decreased safety awareness;Cardiopulmonary status limiting activity  PT Treatment Interventions DME instruction;Gait training;Functional mobility training;Therapeutic activities;Therapeutic exercise;Balance training;Neuromuscular re-education;Patient/family education   PT Goals (Current goals can be found in the Care Plan section) Acute Rehab PT Goals Patient Stated Goal: to get to SNF PT Goal Formulation: With patient Time For Goal Achievement: 03/24/16 Potential to Achieve Goals: Good    Frequency Min 3X/week   Barriers to discharge Decreased caregiver support;Other (comment) (home situation is not clear)      Co-evaluation               End of Session Equipment Utilized During Treatment: Gait belt Activity Tolerance: Patient tolerated treatment well;Treatment limited secondary to medical complications (Comment) (dizziness but pulses stable at 96-97 bmin with gait ) Patient left: in bed;with call bell/phone within reach Nurse Communication: Mobility status         Time: 1010-1032 PT Time Calculation (min) (ACUTE ONLY): 22 min   Charges:   PT Evaluation $PT Eval Moderate Complexity: 1 Procedure     PT G CodesIvar Morrison 04-06-2016, 11:11 AM    Johnathan Morrison, PT MS Acute Rehab Dept. Number: [facility] [id number] and [facility] [phone]

## 2016-03-10 NOTE — Progress Notes (Signed)
CH responded to a consult for an Advanced Directive. CH delivered the document and gave an overview. Pt will read through with Son and advise the nurse when they are ready [facility] is available for follow up as needed.  Lorne Skeens Annasophia Crocker    03/10/16 1400  Clinical Encounter Type  Visited With Patient  Visit Type Initial;Spiritual support;Social support  Referral From Nurse  Spiritual Encounters  Spiritual Needs Literature;Emotional

## 2016-03-10 NOTE — Progress Notes (Signed)
Advanced Heart Failure Rounding Note   Subjective:    Admitted with chest pain, dyspnea, and poor appetite.   On July 20th he presented to the ED with increased dyspnea. Creatinine elevated to 2.68.  Given IV fluids and torsemide, dig, spiro, and potassium stopped. Later discharged. Currently, creatinine back down to 1.18.   Remains dyspneic on exertion.  States he has a very poor appetite.  States eating makes him feel nauseated. No CP currently. Worried about losing his apartment if he goes to SNF.  PT yet to see ( prohibited yesterday with hypotension and ongoing CP)  Creatinine relatively stable. 1.56 -> 1.39 -> 1.44.  Off diuretics. K 4.6.  RHC  03/08/16   RA mean 2 RV 32/2 PA 31/12, mean 20 PCWP mean 7 Oxygen saturations: PA 69% AO 99% Cardiac Output (Fick) 4.27  Cardiac Index (Fick) 2.15  RHC 02/25/16 RA mean 9 RV 39/10 PA 36/12, mean 24 PCWP mean 13 LV 79/18 AO 82/56 Oxygen saturations: PA 54% AO 96% Cardiac Output (Fick) 3.94  Cardiac Index (Fick) 1.89    Objective:   Weight Range:  Vital Signs:   Temp:  [97.6 F (36.4 C)-98.2 F (36.8 C)] 97.6 F (36.4 C) (07/27 0607) Pulse Rate:  [80-96] 80 (07/27 0607) Resp:  [18-19] 19 (07/27 0607) BP: (91-113)/(57-75) 91/65 (07/27 0607) SpO2:  [98 %-99 %] 99 % (07/27 0607) Weight:  [168 lb 11.2 oz (76.5 kg)] 168 lb 11.2 oz (76.5 kg) (07/27 0607) Last BM Date: 03/09/16  Weight change: Filed Weights   03/07/16 1004 03/08/16 0454 03/10/16 0607  Weight: 172 lb 6.4 oz (78.2 kg) 172 lb 1.6 oz (78.1 kg) 168 lb 11.2 oz (76.5 kg)    Intake/Output:   Intake/Output Summary (Last 24 hours) at 03/10/16 0750 Last data filed at 03/10/16 4098  Gross per 24 hour  Intake              480 ml  Output              500 ml  Net              -20 ml     Physical Exam: General:  Fatigued appearing. NAD. In bed  HEENT: normal Neck: supple. JVP flat . Carotids 2+ bilat; no bruits. No  thyromegaly or nodule noted. Cor:  PMI nondisplaced. RRR. No rubs, or murmurs. + S3  Lungs: CTAB, normal effort Abdomen: soft, NT, ND, no HSM. No bruits or masses. +BS  Extremities: no cyanosis, clubbing, rash. No peripheral edema Neuro: alert & orientedx3, cranial nerves grossly intact. moves all 4 extremities w/o difficulty. Affect pleasant  Telemetry: Reviewed personally, Paced 80-90s    Labs: Basic Metabolic Panel:  Recent Labs Lab 03/05/16 1942 03/05/16 1947 03/06/16 0318 03/07/16 1557 03/08/16 1118 03/09/16 0254  NA 130* 133* 132* 130* 131* 131*  K 5.1 5.3* 4.8 4.5 4.6 4.3  CL 98* 97* 102 93* 94* 96*  CO2 24  --  23 25 27 25   GLUCOSE 104* 103* 89 97 96 95  BUN 36* 40* 31* 33* 39* 42*  CREATININE 1.40* 1.40* 1.18 1.41* 1.56* 1.39*  CALCIUM 10.0  --  9.8 10.3 10.4* 10.4*    Liver Function Tests:  Recent Labs Lab 03/03/16 1030 03/06/16 0318  AST 32 30  ALT 27 24  ALKPHOS 77 63  BILITOT 1.6* 1.1  PROT 9.1* 8.1  ALBUMIN 4.2 3.6   No results for input(s): LIPASE, AMYLASE in the last 168  hours. No results for input(s): AMMONIA in the last 168 hours.  CBC:  Recent Labs Lab 03/03/16 1030 03/05/16 1942 03/05/16 1947 03/06/16 0318 03/07/16 1557 03/08/16 1118  WBC 4.3 3.8*  --  3.5* 4.2 3.9*  NEUTROABS 2.4  --   --   --   --   --   HGB 15.0 14.9 16.3 14.1 15.2 15.3  HCT 45.0 44.7 48.0 43.1 45.5 45.8  MCV 91.8 92.2  --  92.3 91.9 91.6  PLT 259 279  --  249 283 281    Cardiac Enzymes:  Recent Labs Lab 03/03/16 1030 03/06/16 0012 03/06/16 0318 03/06/16 1001  TROPONINI 0.03* 0.04* 0.04* 0.04*    BNP: BNP (last 3 results)  Recent Labs  02/23/16 0836 03/03/16 1030 03/05/16 2229  BNP 2,021.0* 115.0* 759.5*    ProBNP (last 3 results) No results for input(s): PROBNP in the last 8760 hours.    Other results:  Imaging: Dg Orthopantogram  Result Date: 03/09/2016 CLINICAL DATA:  Preoperative evaluation to rule out surgical contraindication Dental caries ,, EXAM:  ORTHOPANTOGRAM/PANORAMIC COMPARISON:  None. FINDINGS: There are numerous missing teeth. There are single remaining maxillary molars, 1 on each side. Single remaining maxillary incisor or lateral incisor noted on the left. There is a single left remaining mandibular molar, with remaining incisors, lateral incisors and canines. Lucency in the left mandibular canine suggests a carie. There is no significant periapical lucency to suggest a periapical abscess. No fracture.  No bone lesion. IMPRESSION: 1. Multiple missing teeth.  No evidence of a root abscess. 2. Probable carie within the remaining the left mandibular canine. No other convincing caries. Electronically Signed   By: Amie Portland M.D.   On: 03/09/2016 08:26  US Thyroid  Result Date: 03/08/2016 CLINICAL DATA:  Hyperthyroidism. EXAM: THYROID ULTRASOUND TECHNIQUE: Ultrasound examination of the thyroid gland and adjacent soft tissues was performed. COMPARISON:  Chest CT 02/12/2016 FINDINGS: Right thyroid lobe Measurements: 4.3 x 2.4 x 1.3 cm. Hypoechoic nodule with a small echogenic focus in the superior right thyroid lobe measuring up to 0.3 cm. Right thyroid tissue is mildly heterogeneous. Left thyroid lobe Measurements: 3.6 x 2.7 x 1.3 cm. Left thyroid tissue is mildly heterogeneous without a discrete nodule. Isthmus Thickness: 0.4 cm.  No nodules visualized. Lymphadenopathy None visualized. IMPRESSION: Single small right thyroid nodule measuring up to 0.3 cm. Findings do not meet current SRU consensus criteria for biopsy. Follow-up by clinical exam is recommended. If patient has known risk factors for thyroid carcinoma, consider follow-up ultrasound in 12 months. If patient is clinically hyperthyroid, consider nuclear medicine thyroid uptake and scan.Reference: Management of Thyroid Nodules Detected at [place]: Society of Radiologists in Ultrasound Consensus Conference Statement. Radiology 2005; [id number]. Electronically Signed   By: Richarda Overlie M.D.   On:  03/08/2016 16:48    Medications:     Scheduled Medications: . atorvastatin  40 mg Oral Daily  . carvedilol  6.25 mg Oral BID WC  . digoxin  125 mcg Oral QODAY  . feeding supplement (ENSURE ENLIVE)  237 mL Oral BID BM  . Glycerin (Adult)  1 suppository Rectal Once  . hydrALAZINE  25 mg Oral Q8H  . isosorbide mononitrate  30 mg Oral Daily  . levETIRAcetam  500 mg Oral Daily  . lisinopril  2.5 mg Oral Daily  . methimazole  40 mg Oral Daily  . mexiletine  150 mg Oral Q8H  . pantoprazole  40 mg Oral Daily  . polyethylene glycol  17 g  Oral Daily  . senna-docusate  2 tablet Oral BID  . sodium chloride flush  3 mL Intravenous Q12H  . sodium chloride flush  3 mL Intravenous Q12H  . spironolactone  12.5 mg Oral Daily  . torsemide  10 mg Oral Daily  . 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 Also had LHC/RHC earlier this month with nonobstructive CAD, mildly elevated RV filling, and low output.   1. Chest Pain: Troponin 0.04>0.04>0.04. Suspect demand ischemia.  - CP likely muscular. LHC with mild nonobstructive CAD earlier this month. He is not volume overloaded on exam or RHC.   2. Acute/Chronic Systolic Heart Failure: St Jude CRT-D device, not set to BiV pace b/c ineffective.  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 NYHA IIIB. He does not appear volume overloaded.  - Volume status stable. No  diuretics.  Will likely only need on a prn basis, if at all, at this time.  - Continue coreg 6.25 mg BID.  - Continue lisinopril 2.5 mg daily. - Continue hydralazine 25 mg tid and imdur 30 mg daily.  - Continue spironolactone 12.5. - Continue digoxin 0.125 mg every other day.   - RHC showed marginal output and low filling pressures.  - Dr Donata Clay has seen as consideration for VAD. We would like to have his thyroid treated first and re-evaluate his cardiac status. Also has unclear level of family support. - Will need CPX once stable as outpatient.  3. H/O VT- VT shock 7/11. Has been amio 200 mg twice a day. Thyroid function abnormal suggesting hyperthyroidism. Stop amio.   Consult EP for alternatives for amio => mexiletine.  4. H/O PAF:  On coumadin. INR 2.4.  Pharmacy dosing coumadin  5. AKI: On  7/20 creatinine 2.68, though due to over-diuresis.   - Creatinine relatively stable at 1.44 this am. Continue to follow with med adjustment.  6. H/O PE: On coumadin. INR 2.4  7. Hyperthyroidism: TSH 0.027 Free T3 Free 9.9  T4 5.3  Suspect iodine-triggered from amiodarone use.  Off amio.  EP consulted with recommendations for mexiletine 150 mg three times a day as an alternative to amio. - Has been started on methimazole 40 mg daily - Will need endocrinology follow up.  8. DNR-  - Appreciate input of Palliative Care  - They are following for goals of care.  9. Thyroid Nodule - Noted on thyroid ultrasound. Not big enough for biopsy. 12 month repeat recommended, unless it increases in size 10. Disposition - Looking at potential SNF on d/c.  Needs PT eval ( have missed him on several occasions now)  Length of Stay: 2  Graciella Freer PA-C  03/10/2016, 7:50 AM  Advanced Heart Failure Team Pager [phone] (M-F; 7a - 4p)  Please contact [facility] for night-coverage after hours (4p -7a ) and weekends on [email or web address]  Patient seen with PA, agree with the above note.  Stable today.  Plan  for SNF at discharge.  Agree with holding off on torsemide for now, maybe 10 mg daily torsemide at discharge.  Continue other meds as ordered, no changes.  Treat hyperthyroidism.   [name] 03/10/2016 1:10 PM

## 2016-03-14 ENCOUNTER — Telehealth (HOSPITAL_COMMUNITY): Payer: Self-pay | Admitting: Surgery

## 2016-03-14 NOTE — Telephone Encounter (Signed)
Heart Failure Nurse Navigator Post Discharge Telephone Call I called to check on Mr. Johnathan Morrison after his recent hospitalization and confirm his follow-up appt.  He tells me that he is concerned about his medications-"dont know what I'm taking".  I assured him that I would call [facility] and review med list to insure that he is taking appropriate medications.  I spoke with his nurse today at [facility] who tells me that he was specifically concerned with his Imdur 30 mg tablet "had never seen it before" and said that it was "making him dizzy".  She did say that it was held yesterday.  She reported his BP at 156/77--much higher than hospitalized BP's.  I told her I would clarify dose and call back with any updates.

## 2016-03-14 NOTE — Telephone Encounter (Signed)
He should take the Imdur.  BP plenty high.  Do not think it would be making him dizzy.  Please review meds with him.

## 2016-03-15 ENCOUNTER — Inpatient Hospital Stay (HOSPITAL_COMMUNITY): Payer: Medicare Other

## 2016-03-15 ENCOUNTER — Telehealth: Payer: Self-pay | Admitting: Cardiology

## 2016-03-15 NOTE — Telephone Encounter (Signed)
Nurse Merita Norton returned call from [facility].  She tells me that Mr. Heinsohn BP today was 113/74 earlier this am and is now 93/64.  She also says that he continues to complain of dizziness and has even experienced chest pain this afternoon which was relived with 1 Ntg SL.  His Imdur was placed on hold by provider in the SNF until today and he actually refused the medication today--saying that it worsens his dizziness.  I reminded her of his appt on Monday 03/21/16.  I also called Mr. Bahl myself and told him that Dr. Shirlee Latch would like him to take the Imdur if he can tolerate it.  I have forwarded information to Dr. Shirlee Latch.

## 2016-03-15 NOTE — Telephone Encounter (Signed)
Mr. Sisco called stating that he is at [facility] and that they are not giving him the correct medications. Please call his cell [phone].

## 2016-03-15 NOTE — Telephone Encounter (Signed)
I called back to [facility] to speak with nurse Monday 7/31 and this AM.  I left a message with supervisor and have yet to hear back from anyone.

## 2016-03-15 NOTE — Telephone Encounter (Signed)
Per nurse for patient at [facility], patient did not think he was supposed to be taking imdur 30 mg but said that he would start taking it tomorrow.

## 2016-03-21 ENCOUNTER — Ambulatory Visit (HOSPITAL_COMMUNITY)
Admit: 2016-03-21 | Discharge: 2016-03-21 | Disposition: A | Discharge status: Home / Self Care | Payer: No Typology Code available for payment source | Source: Ambulatory Visit | Attending: Internal Medicine | Admitting: Internal Medicine

## 2016-03-21 ENCOUNTER — Other Ambulatory Visit (HOSPITAL_COMMUNITY): Payer: Medicare Other | Admitting: Dentistry

## 2016-03-21 VITALS — BP 84/58 | HR 84 | Wt 170.5 lb

## 2016-03-21 DIAGNOSIS — Z95810 Presence of automatic (implantable) cardiac defibrillator: Secondary | ICD-10-CM | POA: Diagnosis not present

## 2016-03-21 DIAGNOSIS — Z7901 Long term (current) use of anticoagulants: Secondary | ICD-10-CM | POA: Diagnosis not present

## 2016-03-21 DIAGNOSIS — Z79899 Other long term (current) drug therapy: Secondary | ICD-10-CM | POA: Diagnosis not present

## 2016-03-21 DIAGNOSIS — I130 Hypertensive heart and chronic kidney disease with heart failure and stage 1 through stage 4 chronic kidney disease, or unspecified chronic kidney disease: Secondary | ICD-10-CM | POA: Insufficient documentation

## 2016-03-21 DIAGNOSIS — J449 Chronic obstructive pulmonary disease, unspecified: Secondary | ICD-10-CM | POA: Insufficient documentation

## 2016-03-21 DIAGNOSIS — I5189 Other ill-defined heart diseases: Secondary | ICD-10-CM

## 2016-03-21 DIAGNOSIS — I519 Heart disease, unspecified: Secondary | ICD-10-CM

## 2016-03-21 DIAGNOSIS — Z86711 Personal history of pulmonary embolism: Secondary | ICD-10-CM | POA: Insufficient documentation

## 2016-03-21 DIAGNOSIS — N183 Chronic kidney disease, stage 3 (moderate): Secondary | ICD-10-CM | POA: Insufficient documentation

## 2016-03-21 DIAGNOSIS — I272 Other secondary pulmonary hypertension: Secondary | ICD-10-CM | POA: Diagnosis not present

## 2016-03-21 DIAGNOSIS — R569 Unspecified convulsions: Secondary | ICD-10-CM | POA: Diagnosis not present

## 2016-03-21 DIAGNOSIS — I480 Paroxysmal atrial fibrillation: Secondary | ICD-10-CM | POA: Insufficient documentation

## 2016-03-21 DIAGNOSIS — R0789 Other chest pain: Secondary | ICD-10-CM | POA: Insufficient documentation

## 2016-03-21 DIAGNOSIS — E0590 Thyrotoxicosis, unspecified without thyrotoxic crisis or storm: Secondary | ICD-10-CM | POA: Insufficient documentation

## 2016-03-21 DIAGNOSIS — I5023 Acute on chronic systolic (congestive) heart failure: Secondary | ICD-10-CM | POA: Diagnosis not present

## 2016-03-21 DIAGNOSIS — E041 Nontoxic single thyroid nodule: Secondary | ICD-10-CM | POA: Diagnosis not present

## 2016-03-21 DIAGNOSIS — Z87891 Personal history of nicotine dependence: Secondary | ICD-10-CM | POA: Insufficient documentation

## 2016-03-21 DIAGNOSIS — I428 Other cardiomyopathies: Secondary | ICD-10-CM | POA: Diagnosis not present

## 2016-03-21 DIAGNOSIS — Z8673 Personal history of transient ischemic attack (TIA), and cerebral infarction without residual deficits: Secondary | ICD-10-CM | POA: Diagnosis not present

## 2016-03-21 LAB — BASIC METABOLIC PANEL
ANION GAP: 8 (ref 5–15)
BUN: 23 mg/dL — ABNORMAL HIGH (ref 6–20)
CO2: 25 mmol/L (ref 22–32)
CREATININE: 1.4 mg/dL — AB (ref 0.61–1.24)
Calcium: 10.5 mg/dL — ABNORMAL HIGH (ref 8.9–10.3)
Chloride: 98 mmol/L — ABNORMAL LOW (ref 101–111)
GFR calc non Af Amer: 50 mL/min — ABNORMAL LOW (ref >60)
GFR, EST AFRICAN AMERICAN: 58 mL/min — AB (ref >60)
Glucose, Bld: 84 mg/dL (ref 65–99)
Potassium: 5.1 mmol/L (ref 3.5–5.1)
SODIUM: 131 mmol/L — AB (ref 135–145)

## 2016-03-21 LAB — BRAIN NATRIURETIC PEPTIDE: B NATRIURETIC PEPTIDE 5: 279.7 pg/mL — AB (ref 0.0–100.0)

## 2016-03-21 NOTE — Patient Instructions (Signed)
Stop Carvedilol  Do not take Torsemide tomorrow (03/22/16)  Labs today  Your physician recommends that you schedule a follow-up appointment in: 4 days

## 2016-03-21 NOTE — Progress Notes (Signed)
Advanced Heart Failure Medication Review by a Pharmacist  Does the patient  feel that his/her medications are working for him/her?  yes  Has the patient been experiencing any side effects to the medications prescribed?  yes  Does the patient measure his/her own blood pressure or blood glucose at home?  yes   Does the patient have any problems obtaining medications due to transportation or finances?   no  Understanding of regimen: good Understanding of indications: good Potential of compliance: good Patient understands to avoid NSAIDs. Patient understands to avoid decongestants.  Issues to address at subsequent visits: None   Pharmacist comments:  Johnathan Morrison is a pleasant 70 yo M presenting from [facility] at [facility] rehab facility with a current medication list. There were a few discrepancies in the list so called and verified doses with the facility and medications were reconciled. He does state that he has been feeling dizzy lately after he takes his medications.   Johnathan Morrison, PharmD, BCPS, CPP Clinical Pharmacist Pager: [phone] Phone: [phone] 03/21/2016 11:20 AM      Time with patient: 6 minutes Preparation and documentation time: 10 minutes Total time: 16 minutes

## 2016-03-21 NOTE — Progress Notes (Signed)
Patient ID: Johnathan Morrison, male   DOB: 29-Nov-1945, 70 y.o.   MRN: [id number]    Advanced Heart Failure Clinic Note    Primary Care: [facility] Primary Cardiologist: Dr. Diona Browner Primary HF: Dr. Shirlee Latch   HPI:  Johnathan Morrison is a 70 y.o. 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  He was admitted to [facility] 02/23/16 with SOB and ? Syncopal episode. Noted to have 30 beats NSVT. ICD interrogation showed 2 shocks. Transferred to [facility] for HF and EP evaluation.  Echo with LVEF 15-20% range. Started on milrinone and weaned off prior to d/c. He was over diuresed with CVP around 2 and received gentle IVF and diuretics adjusted. Also treated for UA. No driving until 5/36/64 with ICD shocks  He presented to ED on 03/03/16 with lightheadedness and received IV fluid and diuretics held.  He had very close follow up scheduled but again reported to ED on 03/05/16 with chest pain, dyspnea, and poor appetite. Had repeat RHC that  Admission with marginal, but preserved Cardiac Output.   He was being considered as a VAD. We decided this admission that we would like to have his other co-morbidities treated, including his hyperthyroidism. He was discharged to SNF.  He presents today for post hospital follow up.  He feels very poorly. Medications look correct on MAR, apart from may be taking full spiro as opposed to half dose. Says he felt overall OK until Thursday. Has been dizzy with standing and more SOB for about 3 days. Felt palpitations on the way to clinic this morning, felt like he felt prior to his ICD shocking him the first few times. States his urine is clear on torsemide.  Having chest pains sometimes at rest, usually just tries to go sleep. He states this is unchanged from  either of his admission.   RHC  03/08/16  RA mean 2 RV 32/2 PA 31/12, mean 20 PCWP mean 7 Oxygen saturations: PA 69% AO 99% Cardiac Output (Fick) 4.27  Cardiac Index (Fick) 2.15  RHC 02/25/16  RA mean 9 RV 39/10 PA 36/12, mean 24 PCWP mean 13 LV 79/18 AO 82/56 Oxygen saturations: PA 54% AO 96% Cardiac Output (Fick) 3.94  Cardiac Index (Fick) 1.89    Past Medical History:  Diagnosis Date  . Atrial fibrillation (HCC)   . Cardiomyopathy (HCC)   . Chronic renal disease, stage III   . Chronic systolic heart failure (HCC)   . COPD (chronic obstructive pulmonary disease) (HCC)   . 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 Diastolic dysfunction    Grade 3  . Essential hypertension   . PE (pulmonary embolism) March 2016  . Pulmonary HTN (HCC)    Rt heart cath June 2016  . SAH (subarachnoid hemorrhage) (HCC) Dec 2015  . Seizures (HCC)     Current Outpatient Prescriptions  Medication Sig Dispense Refill  . acetaminophen (TYLENOL) 325 MG tablet Take 2 tablets (650 mg total) by mouth every 4 (four) hours as needed for headache or mild pain.    Marland Kitchen albuterol (PROVENTIL HFA;VENTOLIN HFA) 108 (90 Base) MCG/ACT inhaler Inhale 2 puffs into the lungs every 6 (six) hours as needed for wheezing or shortness of breath. 1 Inhaler 0  . atorvastatin (LIPITOR) 40 MG tablet Take 40  mg by mouth every morning.   0  . carvedilol (COREG) 6.25 MG tablet Take 1 tablet (6.25 mg total) by mouth 2 (two) times daily with a meal. 60 tablet 6  . digoxin (LANOXIN) 0.125 MG tablet Take 125 mcg by mouth daily.  6  . esomeprazole (NEXIUM) 20 MG capsule Take 20 mg by mouth daily at 12 noon.    . feeding supplement, ENSURE ENLIVE, (ENSURE ENLIVE) LIQD Take 237 mLs by mouth 2 (two) times daily between meals. 60 Bottle 0  . hydrALAZINE (APRESOLINE) 25 MG tablet Take 1 tablet (25 mg total) by mouth 3 (three) times daily. (Patient taking differently: Take 25 mg by mouth every 8 (eight) hours. ) 90 tablet 6   . isosorbide mononitrate (IMDUR) 30 MG 24 hr tablet Take 1 tablet (30 mg total) by mouth daily. 30 tablet 6  . levETIRAcetam (KEPPRA) 500 MG tablet Take 500 mg by mouth daily.     Marland Kitchen lisinopril (PRINIVIL,ZESTRIL) 2.5 MG tablet Take 1 tablet (2.5 mg total) by mouth daily. 30 tablet 0  . LORazepam (ATIVAN) 0.5 MG tablet Take 0.5 mg by mouth every 8 (eight) hours as needed for anxiety. Reported on 02/23/2016    . methimazole (TAPAZOLE) 10 MG tablet Take 4 tablets (40 mg total) by mouth daily. 30 tablet 0  . mexiletine (MEXITIL) 150 MG capsule Take 1 capsule (150 mg total) by mouth every 8 (eight) hours. 90 capsule 0  . nitroGLYCERIN (NITROSTAT) 0.4 MG SL tablet Place 1 tablet (0.4 mg total) under the tongue every 5 (five) minutes as needed for chest pain. 30 tablet 0  . polyethylene glycol (MIRALAX / GLYCOLAX) packet Take 17 g by mouth daily. 14 each 0  . potassium chloride (K-DUR) 10 MEQ tablet Take 20 mEq by mouth 2 (two) times daily.  6  . spironolactone (ALDACTONE) 25 MG tablet Take 12.5 mg by mouth daily.    Marland Kitchen torsemide (DEMADEX) 20 MG tablet Take 10 mg by mouth daily.  6  . warfarin (COUMADIN) 5 MG tablet Take 1 tablet (5 mg total) by mouth daily. (Patient taking differently: Take 5 mg by mouth every morning. ) 30 tablet 6   No current facility-administered medications for this encounter.     Allergies  Allergen Reactions  . 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 Marital status: Single    Spouse name: N/A  . Number of children: N/A  . Years of education: N/A   Occupational History  . Not on file.   Social History Main Topics  . Smoking status: Former Games developer  . Smokeless tobacco: Not on file     Comment: smoked 1.5 ppd x 30 years, quit in 2012)  . Alcohol use No  . Drug use: No  . Sexual activity: Not on file   Other Topics Concern  . Not on file   Social History Narrative  . No  narrative on file      Family History  Problem Relation Age of Onset  . Cardiomyopathy    . Sudden death      Vitals:   2016-03-25 1106  BP: (!) 84/58  Pulse: 84  SpO2: 98%  Weight: 170 lb 8 oz (77.3 kg)   Wt Readings from Last 3 Encounters:  Mar 25, 2016 170 lb 8 oz (77.3 kg)  03/10/16 168 lb 11.2 oz (76.5 kg)  03/03/16 178 lb 1 oz (  80.8 kg)    Physical Exam: General: Fatigued, elderly, and chronically ill appearing.  HEENT: normal Neck: supple. JVP flat . Carotids 2+ bilat; no bruits. No  thyromegaly or nodule noted. Cor: PMI nondisplaced. RRR. No rubs, or murmurs. + S3  Lungs: Clear, normal effort Abdomen: soft, NT, ND, no HSM. No bruits or masses. +BS  Extremities: no cyanosis, clubbing, rash. No peripheral edema Neuro: alert & orientedx3, cranial nerves grossly intact. moves all 4 extremities w/o difficulty. Affect pleasant   ASSESSMENT & PLAN:   1. Acute/Chronic Systolic Heart Failure: St Jude CRT-D device, not set to BiV pace b/c ineffective.  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 NYHA IIIB. He does not appear volume overloaded.  - Volume status stable to dry.  Will hold torsemide tomorrow, and have resume at 10 mg daily on 03/23/16.   - Stop coreg with concerns for recurrent low output.  - Continue lisinopril 2.5 mg daily. - Continue hydralazine 25 mg tid - Can add back imdur 30 mg as tolerated.  - Continue spironolactone 12.5. - Continue digoxin 0.125 mg every other day.   - RHC showed marginal output and low filling pressures.  - Dr Donata Clay has seen as consideration for VAD. We would like to have his thyroid treated first and re-evaluate his cardiac status. Also has unclear level of family support. - Will need CPX once stable as outpatient.  2. Atypical chest pain - has been on going since his presentation.  - LHC with mild/non-obstructive disease. Troponins negative last admission with no changes in symptoms - Have tried to get on imdur but has not tolerated  with dizziness.  3. H/O VT- VT shock 7/11.  - Switched from amio to mexilitene with thyroid dysfunction.  - No driving until 08/6107 4. H/O PAF:   - On coumadin.  Follows with coumadin clinic.  5. AKI:  - Had improved prior to d/c.  BMET today.  6. H/O PE: On coumadin. INR 2.4  7. Hyperthyroidism: TSH 0.027 Free T3 Free 9.9  T4 5.3  Suspect iodine-triggered from amiodarone use.  Off amio.  EP consulted with recommendations for mexiletine 150 mg TID as an alternative to amio. - Has been started on methimazole 40 mg daily - Needs to make appt with Dr. Lafe Garin.  8. Thyroid Nodule - Noted on thyroid ultrasound. Not big enough for biopsy. 12 month repeat recommended, unless it increases in size  Stop coreg.  Hold torsemide tomorrow as he is on dry side.  Labs today.  Will follow up later this week.  If still feeling bad may need to come back in to hospital.   Graciella Freer, PA-C 03/21/16   Patient seen and examined with Otilio Saber, PA-C. We discussed all aspects of the encounter. I agree with the assessment and plan as stated above.    Volume status much improved but he is very fatigued. I am concerned about low output. Will stop carvedilol. Check labs today and see back on Friday. Continue mexilitene for VT (off amio due to thyroid toxicity).  Zeidy Tayag,MD 11:36 PM

## 2016-03-23 ENCOUNTER — Telehealth (HOSPITAL_COMMUNITY): Payer: Self-pay

## 2016-03-23 NOTE — Telephone Encounter (Signed)
03/23/16 Patient called and cancelled Dental Consult w/Dr. Kristin Bruins on 03/24/16 @ 7:45.  Pt. was offered a later time but pt. refused .  Pt. to call bk to reschedule.   LRI

## 2016-03-24 ENCOUNTER — Other Ambulatory Visit (HOSPITAL_COMMUNITY): Payer: Medicare Other | Admitting: Dentistry

## 2016-03-24 ENCOUNTER — Telehealth (HOSPITAL_COMMUNITY): Payer: Self-pay | Admitting: *Deleted

## 2016-03-24 ENCOUNTER — Telehealth: Payer: Self-pay | Admitting: *Deleted

## 2016-03-24 NOTE — Telephone Encounter (Signed)
Spoke w/nurses at [facility], they called to report pt's BNP done on 8/8 was 2437.  Advised at our office on 8/7 BNP was 279.  She reports pt is stable, wt stable at 170 lb BP has improved, it was 114/51 today and was 104/71 yesterday.  Upon review of pt's meds they did not hold his Torsemide on 8/8 as advised and pt has continued to receive Torsemide 20 mg daily, advised to hold it tomorrow since he has already received today's dose.  Pt is sch for f/u with [place] tomorrow and they do have transportation arranged for him, they will send med rec along with list of wt's and BP's.

## 2016-03-24 NOTE — Telephone Encounter (Signed)
Pt is at [facility] and is c/o the nursing not giving him correct medications. Offered to call [facility] nursing staff to go over meds, pt says he just wants this documented in chart for CHF appt tomorrow. Nurse also called [facility] on 8/1 regarding pt medication.

## 2016-03-25 ENCOUNTER — Ambulatory Visit (HOSPITAL_COMMUNITY)
Admission: RE | Admit: 2016-03-25 | Discharge: 2016-03-25 | Disposition: A | Discharge status: Home / Self Care | Payer: No Typology Code available for payment source | Source: Ambulatory Visit | Attending: Internal Medicine | Admitting: Internal Medicine

## 2016-03-25 VITALS — BP 118/80 | HR 64 | Resp 90 | Wt 170.4 lb

## 2016-03-25 DIAGNOSIS — Z79899 Other long term (current) drug therapy: Secondary | ICD-10-CM | POA: Insufficient documentation

## 2016-03-25 DIAGNOSIS — I272 Other secondary pulmonary hypertension: Secondary | ICD-10-CM | POA: Insufficient documentation

## 2016-03-25 DIAGNOSIS — I5022 Chronic systolic (congestive) heart failure: Secondary | ICD-10-CM | POA: Diagnosis not present

## 2016-03-25 DIAGNOSIS — I480 Paroxysmal atrial fibrillation: Secondary | ICD-10-CM | POA: Diagnosis not present

## 2016-03-25 DIAGNOSIS — I2699 Other pulmonary embolism without acute cor pulmonale: Secondary | ICD-10-CM | POA: Diagnosis not present

## 2016-03-25 LAB — BASIC METABOLIC PANEL
Anion gap: 11 (ref 5–15)
BUN: 24 mg/dL — AB (ref 6–20)
CALCIUM: 10.5 mg/dL — AB (ref 8.9–10.3)
CHLORIDE: 97 mmol/L — AB (ref 101–111)
CO2: 23 mmol/L (ref 22–32)
CREATININE: 1.25 mg/dL — AB (ref 0.61–1.24)
GFR, EST NON AFRICAN AMERICAN: 57 mL/min — AB (ref >60)
Glucose, Bld: 91 mg/dL (ref 65–99)
Potassium: 5 mmol/L (ref 3.5–5.1)
SODIUM: 131 mmol/L — AB (ref 135–145)

## 2016-03-25 MED ORDER — GUAIFENESIN ER 600 MG PO TB12: 1200.0000 mg | ORAL_TABLET | Freq: Two times a day (BID) | ORAL | 6 refills | Status: AC

## 2016-03-25 NOTE — Progress Notes (Signed)
Patient ID: Johnathan Morrison, male   DOB: [birth year], 70 y.o.   MRN: [id number]    Advanced Heart Failure Clinic Note    Primary Care: [facility] Primary Cardiologist: Dr. Domenic Polite Primary HF: Dr. Aundra Dubin   HPI:  Johnathan Morrison is a 70 y.o. 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  He was admitted to [facility] 02/23/16 with SOB and ? Syncopal episode. Noted to have 30 beats NSVT. ICD interrogation showed 2 shocks. Transferred to [facility] for HF and EP evaluation.  Echo with LVEF 15-20% range. Started on milrinone and weaned off prior to d/c. He was over diuresed with CVP around 2 and received gentle IVF and diuretics adjusted. Also treated for UA. No driving until 12/06/51 with ICD shocks  He presented to ED on 03/03/16 with lightheadedness and received IV fluid and diuretics held.  He had very close follow up scheduled but again reported to ED on 03/05/16 with chest pain, dyspnea, and poor appetite. Had repeat RHC that  Admission with marginal, but preserved Cardiac Output.   He was being considered as a VAD. We decided this admission that we would like to have his other co-morbidities treated, including his hyperthyroidism. He was discharged to SNF.  We saw him on Monday for post hospital follow up.  He was feeling poorly. Carvedilol stopped. Labs looked ok (K 5.1, creatinine stable at 1.4, BNP 279 - down from 760) Here for follow-up. He is at [facility] in [place]. He states that nurses not giving him the right medicines so he met with doctor there to discuss. Overall feels better. More energy. Still with phlegm in his chest. Weight stable at 169-170. No orthopnea, PND, edema. Gets around with walker.    RHC  03/08/16  RA mean 2 RV 32/2 PA 31/12, mean 20 PCWP mean  7 Oxygen saturations: PA 69% AO 99% Cardiac Output (Fick) 4.27  Cardiac Index (Fick) 2.15  RHC 02/25/16  RA mean 9 RV 39/10 PA 36/12, mean 24 PCWP mean 13 LV 79/18 AO 82/56 Oxygen saturations: PA 54% AO 96% Cardiac Output (Fick) 3.94  Cardiac Index (Fick) 1.89    Past Medical History:  Diagnosis Date  . Atrial fibrillation ([place])   . Cardiomyopathy ([place])   . Chronic renal disease, stage III   . Chronic systolic heart failure ([place])   . COPD (chronic obstructive pulmonary disease) ([place])   . CVA (cerebral infarction) Feb 6144   Embolic  . Diastolic dysfunction    Grade 3  . Essential hypertension   . PE (pulmonary embolism) March 2016  . Pulmonary HTN ([place])    Rt heart cath June 2016  . SAH (subarachnoid hemorrhage) ([place]) Dec 2015  . Seizures ([place])     Current Outpatient Prescriptions  Medication Sig Dispense Refill  . acetaminophen (TYLENOL) 325 MG tablet Take 2 tablets (650 mg total) by mouth every 4 (four) hours as needed for headache or mild pain.    Marland Kitchen albuterol (PROVENTIL HFA;VENTOLIN HFA) 108 (90 Base) MCG/ACT inhaler Inhale 2 puffs into the lungs every 6 (six) hours as needed for wheezing or shortness of breath. 1 Inhaler 0  . atorvastatin (LIPITOR) 40 MG tablet Take 40 mg by mouth every morning.   0  . digoxin (LANOXIN) 0.125 MG tablet Take 125 mcg  by mouth daily.  6  . esomeprazole (NEXIUM) 20 MG capsule Take 20 mg by mouth daily at 12 noon.    . hydrALAZINE (APRESOLINE) 25 MG tablet Take 25 mg by mouth 3 (three) times daily. Hold for BP < 120/80 mmHg    . isosorbide mononitrate (IMDUR) 30 MG 24 hr tablet Take 1 tablet (30 mg total) by mouth daily. 30 tablet 6  . levETIRAcetam (KEPPRA) 500 MG tablet Take 500 mg by mouth daily.     Marland Kitchen lisinopril (PRINIVIL,ZESTRIL) 2.5 MG tablet Take 1 tablet (2.5 mg total) by mouth daily. 30 tablet 0  . LORazepam (ATIVAN) 0.5 MG tablet Take 0.5 mg by mouth every 8 (eight) hours as needed for anxiety. Reported on 02/23/2016    .  methimazole (TAPAZOLE) 10 MG tablet Take 10 mg by mouth 3 (three) times daily.     Marland Kitchen mexiletine (MEXITIL) 150 MG capsule Take 1 capsule (150 mg total) by mouth every 8 (eight) hours. 90 capsule 0  . polyethylene glycol (MIRALAX / GLYCOLAX) packet Take 17 g by mouth daily. 14 each 0  . potassium chloride SA (K-DUR,KLOR-CON) 20 MEQ tablet Take 20 mEq by mouth 2 (two) times daily.    . Rivaroxaban (XARELTO) 15 MG TABS tablet Take 15 mg by mouth daily with supper.    Marland Kitchen spironolactone (ALDACTONE) 25 MG tablet Take 25 mg by mouth daily.    . temazepam (RESTORIL) 7.5 MG capsule Take 7.5 mg by mouth at bedtime as needed for sleep.    . nitroGLYCERIN (NITROSTAT) 0.4 MG SL tablet Place 1 tablet (0.4 mg total) under the tongue every 5 (five) minutes as needed for chest pain. (Patient not taking: Reported on 03/21/2016) 30 tablet 0  . torsemide (DEMADEX) 10 MG tablet Take 10 mg by mouth daily.     No current facility-administered medications for this encounter.     Allergies  Allergen Reactions  . 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 Marital status: Single    Spouse name: N/A  . Number of children: N/A  . Years of education: N/A   Occupational History  . Not on file.   Social History Main Topics  . Smoking status: Former Research scientist (life sciences)  . Smokeless tobacco: Not on file     Comment: smoked 1.5 ppd x 30 years, quit in 2012)  . Alcohol use No  . Drug use: No  . Sexual activity: Not on file   Other Topics Concern  . Not on file   Social History Narrative  . No narrative on file      Family History  Problem Relation Age of Onset  . Cardiomyopathy    . Sudden death      Vitals:   15-Apr-2016 1354  BP: 118/80  Pulse: 64  Resp: (!) 90  Weight: 170 lb 6.4 oz (77.3 kg)   Wt Readings from Last 3 Encounters:  Apr 15, 2016 170 lb 6.4 oz (77.3 kg)  03/21/16 170 lb 8 oz (77.3 kg)  03/10/16 168 lb 11.2 oz (76.5  kg)    Physical Exam: General: Looks ok. More alert  HEENT: normal Neck: supple. JVP 5-6 . Carotids 2+ bilat; no bruits. No  thyromegaly or nodule noted. Cor: PMI nondisplaced. RRR. No rubs, or murmurs. + S3  Lungs: Clear, normal effort Abdomen: soft, NT, ND, no HSM. No bruits or masses. +BS  Extremities: no cyanosis, clubbing,  rash. No peripheral edema Neuro: alert & orientedx3, cranial nerves grossly intact. Weak on left   ASSESSMENT & PLAN:   1. Acute/Chronic Systolic Heart Failure: St Jude CRT-D device, not set to BiV pace b/c ineffective.  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 NYHA IIIB. He does not appear volume overloaded.  - Volume status looks good. Will check ReDS reading. Continue torsemide 10 daily.  - Continue to hold coreg with concerns for recurrent low output earlier this week.  - Continue lisinopril 2.5 mg daily. - Continue hydralazine 25 mg tid - Contine imdur 30 mg - Continue spironolactone 12.5. - Continue digoxin 0.125 mg every other day.   - RHC showed marginal output and low filling pressures.  - Dr Prescott Gum has seen as consideration for VAD. We would like to have his thyroid treated first and re-evaluate his cardiac status. Also has unclear level of family support. - Will need CPX once stable as outpatient.  2. Atypical chest pain - resolved - LHC with mild/non-obstructive disease. Troponins negative last admission with no changes in symptoms 3. H/O VT- VT shock 7/11.  - Switched from amio to mexilitene with thyroid dysfunction.  - No driving until 09/7780 4. H/O PAF:   - On coumadin.  Follows with coumadin clinic.  5. AKI:  - Had improved prior to d/c.  Repeat today with recent hyperkalemia 6. H/O PE: On coumadin. INR 2.4  7. Hyperthyroidism: TSH 0.027 Free T3 Free 9.9  T4 5.3  Suspect iodine-triggered from amiodarone use.  Off amio.  EP consulted with recommendations for mexiletine 150 mg TID as an alternative to amio. - Has been started on methimazole  40 mg daily - Needs to make appt with Dr. Renne Crigler.  8. Thyroid Nodule - Noted on thyroid ultrasound. Not big enough for biopsy. 12 month repeat recommended, unless it increases in size 9. Nasal congestion - start mucinex 1200 bid  RTC 4 weeks  Johnathan Rosado,MD 2:13 PM

## 2016-03-25 NOTE — Patient Instructions (Signed)
Routine lab work today. Will notify you of abnormal results, otherwise no news is good news!  Take Mucinex 1200 mg twice daily as needed for congestion. Avoid decongestants!  Follow up 1 month with Dr. Gala Romney.  Do the following things EVERYDAY: 1) Weigh yourself in the morning before breakfast. Write it down and keep it in a log. 2) Take your medicines as prescribed 3) Eat low salt foods-Limit salt (sodium) to 2000 mg per day.  4) Stay as active as you can everyday 5) Limit all fluids for the day to less than 2 liters

## 2016-03-25 NOTE — Progress Notes (Signed)
Advanced Heart Failure Medication Review by a Pharmacist  Does the patient  feel that his/her medications are working for him/her?  yes  Has the patient been experiencing any side effects to the medications prescribed?  no  Does the patient measure his/her own blood pressure or blood glucose at home?  yes   Does the patient have any problems obtaining medications due to transportation or finances?   no  Understanding of regimen: fair Understanding of indications: fair Potential of compliance: excellent Patient understands to avoid NSAIDs. Patient understands to avoid decongestants.  Issues to address at subsequent visits: None   Pharmacist comments:  Johnathan Morrison is a pleasant 70 yo M presenting from [facility] with an up-to-date MAR. He states that the nurses are not giving him the medications he is supposed to be taking and he is to have a meeting with the "head nurse". All medications were reconciled.   Johnathan Morrison. Bonnye Fava, PharmD, BCPS, CPP Clinical Pharmacist Pager: [phone] Phone: [phone] 03/25/2016 2:21 PM    Time with patient: 10 minutes Preparation and documentation time: 2 minutes Total time: 12 minutes

## 2016-03-30 ENCOUNTER — Encounter: Payer: Self-pay | Admitting: Internal Medicine

## 2016-03-30 ENCOUNTER — Telehealth: Payer: Self-pay | Admitting: Cardiology

## 2016-03-30 NOTE — Telephone Encounter (Signed)
Placed call to [facility].  Discussed with his nurse his phone call below.  She stated that she is there with him for 12 hours & sometimes he just doesn't eat.  Stated they have not noted any drastic weight change up or down.  Also, not been in any distress today.  Stated primary doc (Dr. Jennette Kettle) was just out last week.

## 2016-03-30 NOTE — Telephone Encounter (Signed)
Call placed to patient.  Stated he does not understand what is going on.  Questions if doctor was going to put that L-Vad device in his chest.  Explained to patient that message would be sent to CHF clinic nurse to address as we have not seen him in this office yet [facility]).  Had visit with Dr. Gala Romney on 03/25/2016.

## 2016-03-30 NOTE — Telephone Encounter (Signed)
Johnathan Morrison called stating that he is losing weight and he thinks his medications are causing him to be sick.

## 2016-03-31 NOTE — Telephone Encounter (Signed)
Spoke w/Julie head of PT at [facility], she states pt is being d/c'd from PT on 8/24.  He does have Medicaid and is able to stay in the facility if he wants, however pt didn't like that option because he would have to turn all of his income over to the facility except $30 a month.  She states pt has been very non compliant with his diet and has been ordering food for delivery, including pizza and cheeseburgers.  She just wanted [place] to be aware of this and will let us know if pt decides to leave after the 24th.

## 2016-04-04 ENCOUNTER — Emergency Department (HOSPITAL_COMMUNITY)
Admission: EM | Admit: 2016-04-04 | Discharge: 2016-04-04 | Disposition: A | Discharge status: Home / Self Care | Payer: Medicare Other | Attending: Emergency Medicine | Admitting: Emergency Medicine

## 2016-04-04 ENCOUNTER — Encounter (HOSPITAL_COMMUNITY): Payer: Self-pay

## 2016-04-04 DIAGNOSIS — R55 Syncope and collapse: Secondary | ICD-10-CM | POA: Insufficient documentation

## 2016-04-04 DIAGNOSIS — Z79899 Other long term (current) drug therapy: Secondary | ICD-10-CM | POA: Insufficient documentation

## 2016-04-04 DIAGNOSIS — Z87891 Personal history of nicotine dependence: Secondary | ICD-10-CM | POA: Insufficient documentation

## 2016-04-04 DIAGNOSIS — I5023 Acute on chronic systolic (congestive) heart failure: Secondary | ICD-10-CM | POA: Diagnosis not present

## 2016-04-04 DIAGNOSIS — J449 Chronic obstructive pulmonary disease, unspecified: Secondary | ICD-10-CM | POA: Insufficient documentation

## 2016-04-04 DIAGNOSIS — I130 Hypertensive heart and chronic kidney disease with heart failure and stage 1 through stage 4 chronic kidney disease, or unspecified chronic kidney disease: Secondary | ICD-10-CM | POA: Diagnosis not present

## 2016-04-04 DIAGNOSIS — N183 Chronic kidney disease, stage 3 (moderate): Secondary | ICD-10-CM | POA: Insufficient documentation

## 2016-04-04 DIAGNOSIS — R5383 Other fatigue: Secondary | ICD-10-CM | POA: Diagnosis present

## 2016-04-04 LAB — BASIC METABOLIC PANEL
Anion gap: 7 (ref 5–15)
BUN: 21 mg/dL — ABNORMAL HIGH (ref 6–20)
CO2: 28 mmol/L (ref 22–32)
Calcium: 10.1 mg/dL (ref 8.9–10.3)
Chloride: 98 mmol/L — ABNORMAL LOW (ref 101–111)
Creatinine, Ser: 1.18 mg/dL (ref 0.61–1.24)
GFR calc Af Amer: >60 mL/min (ref >60)
GFR calc non Af Amer: >60 mL/min (ref >60)
Glucose, Bld: 100 mg/dL — ABNORMAL HIGH (ref 65–99)
Potassium: 4.6 mmol/L (ref 3.5–5.1)
Sodium: 133 mmol/L — ABNORMAL LOW (ref 135–145)

## 2016-04-04 LAB — CBC WITH DIFFERENTIAL/PLATELET
Basophils Absolute: 0 10*3/uL (ref 0.0–0.1)
Basophils Relative: 0 %
Eosinophils Absolute: 0.2 10*3/uL (ref 0.0–0.7)
Eosinophils Relative: 5 %
HCT: 44.4 % (ref 39.0–52.0)
Hemoglobin: 15.1 g/dL (ref 13.0–17.0)
Lymphocytes Relative: 34 %
Lymphs Abs: 1.6 10*3/uL (ref 0.7–4.0)
MCH: 30.3 pg (ref 26.0–34.0)
MCHC: 34 g/dL (ref 30.0–36.0)
MCV: 89 fL (ref 78.0–100.0)
Monocytes Absolute: 0.8 10*3/uL (ref 0.1–1.0)
Monocytes Relative: 17 %
Neutro Abs: 2 10*3/uL (ref 1.7–7.7)
Neutrophils Relative %: 44 %
Platelets: 178 10*3/uL (ref 150–400)
RBC: 4.99 MIL/uL (ref 4.22–5.81)
RDW: 13.4 % (ref 11.5–15.5)
WBC: 4.5 10*3/uL (ref 4.0–10.5)

## 2016-04-04 LAB — BRAIN NATRIURETIC PEPTIDE: B Natriuretic Peptide: 327 pg/mL — ABNORMAL HIGH (ref 0.0–100.0)

## 2016-04-04 LAB — TROPONIN I: Troponin I: 0.03 ng/mL (ref <0.03)

## 2016-04-04 LAB — PROTIME-INR
INR: 1.1
Prothrombin Time: 14.2 seconds (ref 11.4–15.2)

## 2016-04-04 LAB — DIGOXIN LEVEL: Digoxin Level: 0.7 ng/mL — ABNORMAL LOW (ref 0.8–2.0)

## 2016-04-04 MED ORDER — SODIUM CHLORIDE 0.9 % IV BOLUS (SEPSIS)
500.0000 mL | Freq: Once | INTRAVENOUS | Status: AC
  Administered 2016-04-04: 500 mL via INTRAVENOUS

## 2016-04-04 NOTE — Discharge Instructions (Signed)
Hold your torsemide and spironolactone for 1 day and then resume as previously prescribed.

## 2016-04-04 NOTE — ED Notes (Signed)
Attempted report. No nurse to phone x3.

## 2016-04-04 NOTE — ED Triage Notes (Signed)
Pt states he needs his medication changed. States every time he takes them he gets dizzy.

## 2016-04-04 NOTE — ED Triage Notes (Signed)
Pt here from [facility] for evaluation of generalized weakness. Per staff at [facility] pt fell yesterday. Pt complain of chronic headache, dizziness and pain on left side . States he has a tumor.

## 2016-04-13 ENCOUNTER — Encounter: Payer: Self-pay | Admitting: Internal Medicine

## 2016-04-13 ENCOUNTER — Ambulatory Visit (INDEPENDENT_AMBULATORY_CARE_PROVIDER_SITE_OTHER): Payer: Medicare Other | Admitting: Internal Medicine

## 2016-04-13 VITALS — BP 112/78 | HR 50 | Ht 75.0 in | Wt 168.0 lb

## 2016-04-13 DIAGNOSIS — I472 Ventricular tachycardia, unspecified: Secondary | ICD-10-CM

## 2016-04-13 DIAGNOSIS — Z95810 Presence of automatic (implantable) cardiac defibrillator: Secondary | ICD-10-CM

## 2016-04-13 DIAGNOSIS — I5043 Acute on chronic combined systolic (congestive) and diastolic (congestive) heart failure: Secondary | ICD-10-CM | POA: Diagnosis not present

## 2016-04-13 DIAGNOSIS — I429 Cardiomyopathy, unspecified: Secondary | ICD-10-CM

## 2016-04-13 DIAGNOSIS — I480 Paroxysmal atrial fibrillation: Secondary | ICD-10-CM

## 2016-04-13 NOTE — Progress Notes (Signed)
Electrophysiology Office Note   Date:  04/13/2016   ID:  Johnathan Morrison, DOB Sep 01, 1945, MRN [id number]  PCP:  [facility]  Cardiologist:  Bensimhon Primary Electrophysiologist: Johnathan Range, MD    CC: SOB   History of Present Illness: Johnathan Morrison is a 70 y.o. male who presents today for electrophysiology evaluation.   He has advanced and probably end stage CHF.  He is being managed in the advanced CHF clinic.  He was recently admitted and noted to have hyperthyroidism.  He had VT at that time.  Amiodarone was switched to mexiletine.  He now presents for EP follow-up.  He is quite agitated.  He reports that his "CHF meds are not working".  He has SOB.  He says that [facility] is not giving him his medicines due to hypotension.  He has chronic SOB and worsening fatigue.   Past Medical History:  Diagnosis Date  . Atrial fibrillation (HCC)   . Cardiomyopathy (H[facility])   . Chronic renal disease, stage III   . Chronic systolic heart failure (H[facility])   . COPD (chronic obstructive pulmonary disease) (H[facility])   . 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 Diastolic dysfunction    Grade 3  . Essential hypertension   . PE (pulmonary embolism) March 2016  . Pulmonary HTN (H[facility])    Rt heart cath June 2016  . SAH (subarachnoid hemorrhage) (H[facility]) Dec 2015  . Seizures (H[facility])    Past Surgical History:  Procedure Laterality Date  . ANKLE SURGERY    . CARDIAC CATHETERIZATION N/A 02/25/2016   Procedure: Right/Left Heart Cath and Coronary Angiography;  Surgeon: Laurey Morale, MD;  Location: [facility] INVASIVE CV LAB;  Service: Cardiovascular;  Laterality: N/A;  . CARDIAC CATHETERIZATION N/A 03/08/2016   Procedure: Right Heart Cath;  Surgeon: Laurey Morale, MD;  Location: [facility] INVASIVE CV LAB;  Service: Cardiovascular;  Laterality: N/A;  . CORONARY ANGIOPLASTY    . CORONARY ANGIOPLASTY    . INSERTION OF ICD  09/25/12   Bi V St Jude   . IVC FILTER PLACEMENT [facility] HX)  March 2016  .  IVC FILTER REMOVAL [facility] HX)  Oct 2016  . TOTAL KNEE ARTHROPLASTY Bilateral      Current Outpatient Prescriptions  Medication Sig Dispense Refill  . acetaminophen (TYLENOL) 325 MG tablet Take 2 tablets (650 mg total) by mouth every 4 (four) hours as needed for headache or mild pain.    Marland Kitchen albuterol (PROVENTIL HFA;VENTOLIN HFA) 108 (90 Base) MCG/ACT inhaler Inhale 2 puffs into the lungs every 6 (six) hours as needed for wheezing or shortness of breath. 1 Inhaler 0  . atorvastatin (LIPITOR) 40 MG tablet Take 40 mg by mouth every morning.   0  . digoxin (LANOXIN) 0.125 MG tablet Take 125 mcg by mouth daily.  6  . esomeprazole (NEXIUM) 20 MG capsule Take 20 mg by mouth daily at 12 noon.    Marland Kitchen guaiFENesin (MUCINEX) 600 MG 12 hr tablet Take 2 tablets (1,200 mg total) by mouth 2 (two) times daily. 120 tablet 6  . hydrALAZINE (APRESOLINE) 25 MG tablet Take 12.5 mg by mouth 3 (three) times daily. HOLD for  Blood pressure levels below 120/80     . isosorbide dinitrate (ISORDIL) 30 MG tablet Take 30 mg by mouth daily.    . isosorbide mononitrate (IMDUR) 30 MG 24 hr tablet Take 1 tablet (30 mg total) by mouth daily. 30 tablet 6  . levETIRAcetam (KEPPRA) 500 MG tablet  Take 500 mg by mouth daily.     Marland Kitchen lisinopril (PRINIVIL,ZESTRIL) 2.5 MG tablet Take 1 tablet (2.5 mg total) by mouth daily. 30 tablet 0  . LORazepam (ATIVAN) 0.5 MG tablet Take 0.5 mg by mouth every 8 (eight) hours as needed for anxiety. Reported on 02/23/2016    . methimazole (TAPAZOLE) 10 MG tablet Take 10 mg by mouth every 8 (eight) hours.    . mexiletine (MEXITIL) 150 MG capsule Take 1 capsule (150 mg total) by mouth every 8 (eight) hours. 90 capsule 0  . nitroGLYCERIN (NITROSTAT) 0.4 MG SL tablet Place 1 tablet (0.4 mg total) under the tongue every 5 (five) minutes as needed for chest pain. 30 tablet 0  . polyethylene glycol (MIRALAX / GLYCOLAX) packet Take 17 g by mouth daily. 14 each 0  . potassium chloride SA (K-DUR,KLOR-CON) 20 MEQ  tablet Take 20 mEq by mouth 2 (two) times daily.    . Rivaroxaban (XARELTO) 15 MG TABS tablet Take 15 mg by mouth daily with supper.    . senna-docusate (SENNALAX-S) 8.6-50 MG tablet Take 1 tablet by mouth every 4 (four) hours as needed for mild constipation.    Marland Kitchen spironolactone (ALDACTONE) 25 MG tablet Take 25 mg by mouth daily.    . temazepam (RESTORIL) 7.5 MG capsule Take 7.5 mg by mouth at bedtime as needed for sleep.    Marland Kitchen torsemide (DEMADEX) 10 MG tablet Take 10 mg by mouth daily.     No current facility-administered medications for this visit.     Allergies:   Amiodarone   Social History:  The patient  reports that he has quit smoking. He has never used smokeless tobacco. He reports that he does not drink alcohol or use drugs.   ROS:  Please see the history of present illness.   All other systems are reviewed and negative.    PHYSICAL EXAM: VS:  BP 112/78   Pulse (!) 50   Ht 6\' 3"  (1.905 m)   Wt 168 lb (76.2 kg)   SpO2 97%   BMI 21.00 kg/m  , BMI Body mass index is 21 kg/m. GEN: ill, in no acute distress  HEENT: normal  Neck: no JVD, carotid bruits, or masses Cardiac: RRR  Respiratory:  clear to auscultation bilaterally, normal work of breathing GI: soft, nontender, nondistended, + BS MS: no deformity or atrophy  Skin: warm and dry, device pocket is well healed Neuro:  Strength and sensation are intact Psych: euthymic mood, full affect  EKG:  EKG from last CHF clinic reviewed  Device interrogation is reviewed today in detail.  See PaceArt for details.   Recent Labs: 03/06/2016: ALT 24 03/07/2016: TSH 0.027 04/04/2016: B Natriuretic Peptide 327.0; BUN 21; Creatinine, Ser 1.18; Hemoglobin 15.1; Platelets 178; Potassium 4.6; Sodium 133    Lipid Panel  No results found for: CHOL, TRIG, HDL, CHOLHDL, VLDL, LDLCALC, LDLDIRECT   Wt Readings from Last 3 Encounters:  04/13/16 168 lb (76.2 kg)  04/04/16 166 lb (75.3 kg)  03/25/16 170 lb 6.4 oz (77.3 kg)      Other  studies Reviewed: Additional studies/ records that were reviewed today include: hospital records and CHF clinic notes, Dr Camnitz's note  Review of the above records today demonstrates: as above   ASSESSMENT AND PLAN:  1.  VT Recent VT in the setting of hyperthyroidism. Now off of amiodarone Doing well with mexiletine No changes today Normal ICD function See Pace Art report No changes today No driving  2.  Acute on chronic combined systolic/ diastolic dysfunction Probably end stage CHF He is very agitated and somewhat difficult to work with today.  I have encouraged him to follow-up with CHF clinic for additional recommendations. Noncompliance with sodium restriction discussed today. He is now off of coreg.  Given hyperthyroidism, would favor restarting this if able.  Will defer to CHF team.  Of note, he says that he received prior ICD [facility] [place] however when we called their clinic today, we were told that he had only been seen there once and that all other visits had been "no shows" or "cancelled appointments" by the patient.  3. Paroxysmal atrial fibrillation Currently in sinus On xarelto CHF pharmacist to assist with dosing  [place] Follow-up closely with CHF team.  Ultimately, I think that a palliative approach may be best. I will see again in 6 months  This is a very ill gentleman at very high risk of decompensation, hospitalization, death.  A high level of decision making was required for this encounter.   SignedHillis Morrison, Donnamae Muilenburg, MD  04/13/2016 1:52 PM     [facility] [address] [place] [address] [phone] (office) [phone] (fax)

## 2016-04-13 NOTE — Patient Instructions (Addendum)

## 2016-04-14 ENCOUNTER — Telehealth (HOSPITAL_COMMUNITY): Payer: Self-pay

## 2016-04-14 NOTE — Addendum Note (Signed)
Encounter addended by: Dolores Patty, MD on: 04/14/2016 12:41 AM<BR>    Actions taken: Sign clinical note, Visit diagnoses modified, LOS modified

## 2016-04-14 NOTE — Telephone Encounter (Signed)
Patient has left 2 VM's past two days (that this RN is aware of), unsure of needs. Attempted to return call to patient both times, patient is at a facility, and each time call is transferred, patient does not answer after lengthy wait of phone ringing.  No VM available to leave a message. Will continue to try to reach patient.  Ave Filter, RN

## 2016-04-14 NOTE — Telephone Encounter (Signed)
error 

## 2016-04-15 ENCOUNTER — Telehealth: Payer: Self-pay | Admitting: *Deleted

## 2016-04-15 LAB — CUP PACEART INCLINIC DEVICE CHECK
Battery Remaining Longevity: 26.4
Date Time Interrogation Session: 20170830172159
HIGH POWER IMPEDANCE MEASURED VALUE: 81 Ohm
Implantable Lead Implant Date: 20140211
Implantable Lead Implant Date: 20140211
Implantable Lead Location: 753858
Implantable Lead Location: 753860
Lead Channel Impedance Value: 375 Ohm
Lead Channel Pacing Threshold Amplitude: 0.5 V
Lead Channel Sensing Intrinsic Amplitude: 12 mV
Lead Channel Setting Pacing Amplitude: 2 V
MDC IDC LEAD IMPLANT DT: 20140211
MDC IDC LEAD LOCATION: 753859
MDC IDC MSMT LEADCHNL LV IMPEDANCE VALUE: 425 Ohm
MDC IDC MSMT LEADCHNL RA PACING THRESHOLD AMPLITUDE: 0.5 V
MDC IDC MSMT LEADCHNL RA PACING THRESHOLD PULSEWIDTH: 0.5 ms
MDC IDC MSMT LEADCHNL RA PACING THRESHOLD PULSEWIDTH: 0.5 ms
MDC IDC MSMT LEADCHNL RA SENSING INTR AMPL: 3.3 mV
MDC IDC MSMT LEADCHNL RV IMPEDANCE VALUE: 700 Ohm
MDC IDC STAT BRADY RA PERCENT PACED: 44 %
MDC IDC STAT BRADY RV PERCENT PACED: 0 %
Pulse Gen Serial Number: 7065329

## 2016-04-15 NOTE — Telephone Encounter (Signed)
Johnathan Morrison transferred from [place] clinic to our office. Monitor and cell adapter ordered. Patient made aware that he will receive 2 packages from [facility]. Jude and to call tech services or our office once he receives them with any questions. He verbalizes understanding.

## 2016-04-20 ENCOUNTER — Telehealth (HOSPITAL_COMMUNITY): Payer: Self-pay | Admitting: *Deleted

## 2016-04-20 NOTE — Telephone Encounter (Signed)
Pt called today and was a little upset. He said that he is down 9 pounds and not getting his medications, that he is suppose to because his BP is too low. He said that he couldn't even walk 20-30 ft without getting short of breath. He can't take a shower its just to much work. He said that he has been nauseated and not eating much. One because the food isn't good and two he just feels sick all the time.  I called the facility and got them to send over his VS and a list of medications so I could see when he has been taking it. Also she did note that if and when pt could take Hydralize it is a half of tablet only TID.  All faxed over. Dr Shirlee Latch will evaluate and we will call pt back.

## 2016-04-21 NOTE — ED Provider Notes (Signed)
[facility]-EMERGENCY DEPT Provider Note   CSN: [id number] Arrival date & time: 04/04/16  1220     History   Chief Complaint Chief Complaint  Patient presents with  . Fatigue    headache    HPI Johnathan Morrison is a 70 y.o. male.  HPI   70 year old male sent for evaluation of generalized weakness. Recent falls and dizziness. Patient reports that his dizziness/lightheadedness with changes in position. He feels like he is on too much medication. A chronic pain complaints. Denies any acute pain. Respiratory complaints. No urinary complaints.  Past Medical History:  Diagnosis Date  . Atrial fibrillation (HCC)   . Cardiomyopathy (HCC)   . Chronic renal disease, stage III   . Chronic systolic heart failure (HCC)   . COPD (chronic obstructive pulmonary disease) (HCC)   . 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 Diastolic dysfunction    Grade 3  . Essential hypertension   . PE (pulmonary embolism) March 2016  . Pulmonary HTN (HCC)    Rt heart cath June 2016  . SAH (subarachnoid hemorrhage) (HCC) Dec 2015  . Seizures [facility])     Patient Active Problem List   Diagnosis Date Noted  . Protein-calorie malnutrition, severe 03/08/2016  . Severe protein-energy malnutrition (HCC) 03/08/2016  . Hyperthyroidism   . Goals of care, counseling/discussion   . Palliative care encounter   . Loss of weight   . Chest pain 03/05/2016  . NSVT (nonsustained ventricular tachycardia) (HCC) 02/24/2016  . Chronic renal disease, stage III   . Diastolic dysfunction   . Pulmonary HTN - Rt heart cath June 2016   . COPD (chronic obstructive pulmonary disease) (HCC)   . History of PE-March 2016   . Cardiomyopathy-presumably NICM   . Acute on chronic systolic congestive heart failure (HCC)   . A-fib (HCC) 02/23/2016  . Chronic anticoagulation 02/23/2016  . 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 HTN (hypertension) 02/23/2016  . History of seizure 02/23/2016  . Acute on chronic systolic  (congestive) heart failure (HCC) 02/23/2016  . 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 SAH (subarachnoid hemorrhage)-Dec 2015 07/15/2014    Past Surgical History:  Procedure Laterality Date  . ANKLE SURGERY    . CARDIAC CATHETERIZATION N/A 02/25/2016   Procedure: Right/Left Heart Cath and Coronary Angiography;  Surgeon: Laurey Morale, MD;  Location: [facility] INVASIVE CV LAB;  Service: Cardiovascular;  Laterality: N/A;  . CARDIAC CATHETERIZATION N/A 03/08/2016   Procedure: Right Heart Cath;  Surgeon: Laurey Morale, MD;  Location: [facility] INVASIVE CV LAB;  Service: Cardiovascular;  Laterality: N/A;  . CORONARY ANGIOPLASTY    . CORONARY ANGIOPLASTY    . INSERTION OF ICD  09/25/12   Bi V St Jude   . IVC FILTER PLACEMENT [facility] HX)  March 2016  . IVC FILTER REMOVAL [facility] HX)  Oct 2016  . TOTAL KNEE ARTHROPLASTY Bilateral        Home Medications    Prior to Admission medications   Medication Sig Start Date End Date Taking? Authorizing Provider  acetaminophen (TYLENOL) 325 MG tablet Take 2 tablets (650 mg total) by mouth every 4 (four) hours as needed for headache or mild pain. 03/10/16  Yes Renae Fickle, MD  albuterol (PROVENTIL HFA;VENTOLIN HFA) 108 (90 Base) MCG/ACT inhaler Inhale 2 puffs into the lungs every 6 (six) hours as needed for wheezing or shortness of breath. 02/12/16  Yes [name], MD  atorvastatin (LIPITOR) 40 MG tablet Take 40 mg  by mouth every morning.  01/27/16  Yes Historical Provider, MD  digoxin (LANOXIN) 0.125 MG tablet Take 125 mcg by mouth daily. 03/01/16  Yes Historical Provider, MD  esomeprazole (NEXIUM) 20 MG capsule Take 20 mg by mouth daily at 12 noon.   Yes Historical Provider, MD  guaiFENesin (MUCINEX) 600 MG 12 hr tablet Take 2 tablets (1,200 mg total) by mouth 2 (two) times daily. 03/25/16  Yes Dolores Pattyaniel R Bensimhon, MD  hydrALAZINE (APRESOLINE) 25 MG tablet Take 12.5 mg by mouth 3 (three) times daily. HOLD for  Blood pressure levels below 120/80     Yes Historical Provider, MD  isosorbide dinitrate (ISORDIL) 30 MG tablet Take 30 mg by mouth daily.   Yes Historical Provider, MD  levETIRAcetam (KEPPRA) 500 MG tablet Take 500 mg by mouth daily.    Yes Historical Provider, MD  LORazepam (ATIVAN) 0.5 MG tablet Take 0.5 mg by mouth every 8 (eight) hours as needed for anxiety. Reported on 02/23/2016   Yes Historical Provider, MD  methimazole (TAPAZOLE) 10 MG tablet Take 10 mg by mouth every 8 (eight) hours.   Yes Historical Provider, MD  mexiletine (MEXITIL) 150 MG capsule Take 1 capsule (150 mg total) by mouth every 8 (eight) hours. 03/10/16  Yes Renae FickleMackenzie Short, MD  polyethylene glycol (MIRALAX / GLYCOLAX) packet Take 17 g by mouth daily. 03/10/16  Yes Renae FickleMackenzie Short, MD  potassium chloride SA (K-DUR,KLOR-CON) 20 MEQ tablet Take 20 mEq by mouth 2 (two) times daily.   Yes Historical Provider, MD  Rivaroxaban (XARELTO) 15 MG TABS tablet Take 15 mg by mouth daily with supper.   Yes Historical Provider, MD  senna-docusate (SENNALAX-S) 8.6-50 MG tablet Take 1 tablet by mouth every 4 (four) hours as needed for mild constipation.   Yes Historical Provider, MD  spironolactone (ALDACTONE) 25 MG tablet Take 25 mg by mouth daily.   Yes Historical Provider, MD  torsemide (DEMADEX) 10 MG tablet Take 10 mg by mouth daily.   Yes Historical Provider, MD  isosorbide mononitrate (IMDUR) 30 MG 24 hr tablet Take 1 tablet (30 mg total) by mouth daily. 03/01/16   Graciella FreerMichael Andrew Tillery, PA-C  lisinopril (PRINIVIL,ZESTRIL) 2.5 MG tablet Take 1 tablet (2.5 mg total) by mouth daily. 03/10/16   Renae FickleMackenzie Short, MD  nitroGLYCERIN (NITROSTAT) 0.4 MG SL tablet Place 1 tablet (0.4 mg total) under the tongue every 5 (five) minutes as needed for chest pain. 02/03/16   Devoria AlbeIva Knapp, MD  temazepam (RESTORIL) 7.5 MG capsule Take 7.5 mg by mouth at bedtime as needed for sleep.    Historical Provider, MD    Family History Family History  Problem Relation Age of Onset  . Cardiomyopathy      . Sudden death      Social History Social History  Substance Use Topics  . Smoking status: Former Games developermoker  . Smokeless tobacco: Never Used     Comment: smoked 1.5 ppd x 30 years, quit in 2012)  . Alcohol use No     Allergies   Amiodarone   Review of Systems Review of Systems  All systems reviewed and negative, other than as noted in HPI.\   Physical Exam Updated Vital Signs BP (!) 96/50   Pulse 80   Temp 97.6 F (36.4 C) (Oral)   Resp 22   Ht 6\' 3"  (1.905 m)   Wt 166 lb (75.3 kg)   SpO2 97%   BMI 20.75 kg/m   Physical Exam  Constitutional: He appears well-developed and well-nourished.  No distress.  HENT:  Head: Normocephalic and atraumatic.  Eyes: Conjunctivae are normal. Right eye exhibits no discharge. Left eye exhibits no discharge.  Neck: Neck supple.  Cardiovascular: Normal rate, regular rhythm and normal heart sounds.  Exam reveals no gallop and no friction rub.   No murmur heard. Pulmonary/Chest: Effort normal and breath sounds normal. No respiratory distress.  Abdominal: Soft. He exhibits no distension. There is no tenderness.  Musculoskeletal: He exhibits no edema or tenderness.  Neurological: He is alert.  Skin: Skin is warm and dry.  Psychiatric: He has a normal mood and affect. His behavior is normal. Thought content normal.  Nursing note and vitals reviewed.    ED Treatments / Results  Labs (all labs ordered are listed, but only abnormal results are displayed) Labs Reviewed  BRAIN NATRIURETIC PEPTIDE - Abnormal; Notable for the following:       Result Value   B Natriuretic Peptide 327.0 (*)    All other components within normal limits  BASIC METABOLIC PANEL - Abnormal; Notable for the following:    Sodium 133 (*)    Chloride 98 (*)    Glucose, Bld 100 (*)    BUN 21 (*)    All other components within normal limits  DIGOXIN LEVEL - Abnormal; Notable for the following:    Digoxin Level 0.7 (*)    All other components within normal  limits  TROPONIN I - Abnormal; Notable for the following:    Troponin I 0.03 (*)    All other components within normal limits  CBC WITH DIFFERENTIAL/PLATELET  PROTIME-INR    EKG  EKG Interpretation  Date/Time:  Monday April 04 2016 12:23:21 EDT Ventricular Rate:  89 PR Interval:    QRS Duration: 151 QT Interval:  382 QTC Calculation: 465 R Axis:   -65 Text Interpretation:  Sinus rhythm Left bundle branch block Confirmed by Yaritzy Huser  MD, Chloeanne Poteet ([id number]) on 04/04/2016 12:51:27 PM       Radiology No results found.  Procedures Procedures (including critical care time)  Medications Ordered in ED Medications  sodium chloride 0.9 % bolus 500 mL (0 mLs Intravenous Stopped 04/04/16 1353)     Initial Impression / Assessment and Plan / ED Course  I have reviewed the triage vital signs and the nursing notes.  Pertinent labs & imaging results that were available during my care of the patient were reviewed by me and considered in my medical decision making (see chart for details).  Clinical Course     Final Clinical Impressions(s) / ED Diagnoses   Final diagnoses:  Near syncope    New Prescriptions Discharge Medication List as of 04/04/2016  3:13 PM       Raeford Razor, MD 04/21/16 1549

## 2016-04-21 NOTE — Telephone Encounter (Signed)
We will need to see him in the office to take a look at everything.  Probably going to be hard to deal with this over the phone.  He needs to come in early next week and see me or PA.

## 2016-04-22 NOTE — Telephone Encounter (Signed)
appt sch for 9/12, spoke w/[facility] to schedule, they will bring pt

## 2016-04-26 ENCOUNTER — Ambulatory Visit (HOSPITAL_COMMUNITY)
Admission: RE | Admit: 2016-04-26 | Discharge: 2016-04-26 | Disposition: A | Discharge status: Home / Self Care | Payer: Medicare Other | Source: Ambulatory Visit | Attending: Internal Medicine | Admitting: Internal Medicine

## 2016-04-26 VITALS — BP 102/80 | HR 106 | Wt 163.0 lb

## 2016-04-26 DIAGNOSIS — Z87891 Personal history of nicotine dependence: Secondary | ICD-10-CM | POA: Diagnosis not present

## 2016-04-26 DIAGNOSIS — I428 Other cardiomyopathies: Secondary | ICD-10-CM | POA: Diagnosis not present

## 2016-04-26 DIAGNOSIS — I130 Hypertensive heart and chronic kidney disease with heart failure and stage 1 through stage 4 chronic kidney disease, or unspecified chronic kidney disease: Secondary | ICD-10-CM | POA: Insufficient documentation

## 2016-04-26 DIAGNOSIS — I429 Cardiomyopathy, unspecified: Secondary | ICD-10-CM | POA: Diagnosis not present

## 2016-04-26 DIAGNOSIS — Z79899 Other long term (current) drug therapy: Secondary | ICD-10-CM | POA: Insufficient documentation

## 2016-04-26 DIAGNOSIS — I5022 Chronic systolic (congestive) heart failure: Secondary | ICD-10-CM | POA: Insufficient documentation

## 2016-04-26 DIAGNOSIS — I519 Heart disease, unspecified: Secondary | ICD-10-CM

## 2016-04-26 DIAGNOSIS — Z8673 Personal history of transient ischemic attack (TIA), and cerebral infarction without residual deficits: Secondary | ICD-10-CM | POA: Diagnosis not present

## 2016-04-26 DIAGNOSIS — Z7901 Long term (current) use of anticoagulants: Secondary | ICD-10-CM | POA: Insufficient documentation

## 2016-04-26 DIAGNOSIS — I272 Other secondary pulmonary hypertension: Secondary | ICD-10-CM | POA: Insufficient documentation

## 2016-04-26 DIAGNOSIS — E041 Nontoxic single thyroid nodule: Secondary | ICD-10-CM | POA: Insufficient documentation

## 2016-04-26 DIAGNOSIS — N183 Chronic kidney disease, stage 3 (moderate): Secondary | ICD-10-CM | POA: Insufficient documentation

## 2016-04-26 DIAGNOSIS — I480 Paroxysmal atrial fibrillation: Secondary | ICD-10-CM | POA: Diagnosis not present

## 2016-04-26 DIAGNOSIS — E0590 Thyrotoxicosis, unspecified without thyrotoxic crisis or storm: Secondary | ICD-10-CM | POA: Diagnosis not present

## 2016-04-26 DIAGNOSIS — Z86711 Personal history of pulmonary embolism: Secondary | ICD-10-CM | POA: Diagnosis not present

## 2016-04-26 DIAGNOSIS — I5189 Other ill-defined heart diseases: Secondary | ICD-10-CM

## 2016-04-26 LAB — BASIC METABOLIC PANEL
Anion gap: 11 (ref 5–15)
BUN: 10 mg/dL (ref 6–20)
CHLORIDE: 103 mmol/L (ref 101–111)
CO2: 23 mmol/L (ref 22–32)
Calcium: 10.4 mg/dL — ABNORMAL HIGH (ref 8.9–10.3)
Creatinine, Ser: 1.07 mg/dL (ref 0.61–1.24)
GFR calc non Af Amer: >60 mL/min (ref >60)
Glucose, Bld: 98 mg/dL (ref 65–99)
POTASSIUM: 4.3 mmol/L (ref 3.5–5.1)
SODIUM: 137 mmol/L (ref 135–145)

## 2016-04-26 LAB — TSH: TSH: 0.041 u[IU]/mL — AB (ref 0.350–4.500)

## 2016-04-26 LAB — BRAIN NATRIURETIC PEPTIDE: B NATRIURETIC PEPTIDE 5: 506.7 pg/mL — AB (ref 0.0–100.0)

## 2016-04-26 NOTE — Progress Notes (Signed)
Patient ID: Johnathan Morrison, male   DOB: [birth year], 70 y.o.   MRN: [id number]    Advanced Heart Failure Clinic Note    Primary Care: [facility] Primary Cardiologist: Dr. Diona Browner Primary HF: Dr. Shirlee Latch   HPI:  Johnathan Morrison is a 70 y.o. 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  He was [facility] to [facility]16 with SOB and ? Syncopal episode. Noted to have 30 beats NSVT. ICD interrogation showed 2 shocks. Transferred to [facility] for HF and EP evaluation.  Echo with LVEF 15-20% range. Started on milrinone and weaned off prior to d/c. He was over diuresed with CVP around 2 and received gentle IVF and diuretics adjusted. Also treated for UA. No driving until 11/22/79 with ICD shocks  He presented to ED on 03/03/16 with lightheadedness and received IV fluid and diuretics held.  He had very close follow up scheduled but again reported to ED on 03/05/16 with chest pain, dyspnea, and poor appetite. Had repeat RHC that  Admission with marginal, but preserved Cardiac Output.   He was being considered as a VAD. We decided this admission that we would like to have his other co-morbidities treated, including his hyperthyroidism. He was discharged to SNF.  Today he returns for HF follow up. Complaining of fatigue and low blood pressure. SOB with exertion. Denies PND/Orthopnea. He says he is frustrated by his medication regimen at SNF. Weight at SNF 162 pounds. Ambulating with a rolling walker. Doesn't like food at SNF.    RHC  03/08/16  RA mean 2 RV 32/2 PA 31/12, mean 20 PCWP mean 7 Oxygen saturations: PA 69% AO 99% Cardiac Output (Fick) 4.27  Cardiac Index (Fick) 2.15  RHC 02/25/16  RA mean 9 RV 39/10 PA 36/12, mean 24 PCWP mean 13 LV 79/18 AO 82/56 Oxygen  saturations: PA 54% AO 96% Cardiac Output (Fick) 3.94  Cardiac Index (Fick) 1.89    Past Medical History:  Diagnosis Date  . Atrial fibrillation (HCC)   . Cardiomyopathy (HCC)   . Chronic renal disease, stage III   . Chronic systolic heart failure (HCC)   . COPD (chronic obstructive pulmonary disease) (HCC)   . 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 Diastolic dysfunction    Grade 3  . Essential hypertension   . PE (pulmonary embolism) March 2016  . Pulmonary HTN (HCC)    Rt heart cath June 2016  . SAH (subarachnoid hemorrhage) (HCC) Dec 2015  . Seizures (HCC)     Current Outpatient Prescriptions  Medication Sig Dispense Refill  . acetaminophen (TYLENOL) 325 MG tablet Take 2 tablets (650 mg total) by mouth every 4 (four) hours as needed for headache or mild pain.    Marland Kitchen albuterol (PROVENTIL HFA;VENTOLIN HFA) 108 (90 Base) MCG/ACT inhaler Inhale 2 puffs into the lungs every 6 (six) hours as needed for wheezing or shortness of breath. 1 Inhaler 0  . atorvastatin (LIPITOR) 40 MG tablet Take 40 mg by mouth every morning.   0  . digoxin (LANOXIN) 0.125 MG tablet Take 125 mcg by mouth daily.  6  . esomeprazole (NEXIUM) 20 MG capsule Take 20 mg by mouth daily at 12 noon.    Marland Kitchen guaiFENesin (MUCINEX) 600 MG 12 hr tablet Take 2 tablets (1,200 mg total)  by mouth 2 (two) times daily. 120 tablet 6  . hydrALAZINE (APRESOLINE) 25 MG tablet Take 12.5 mg by mouth 2 (two) times daily. HOLD for  Blood pressure levels below 130/70 pulse 70    . isosorbide dinitrate (ISORDIL) 30 MG tablet Take 30 mg by mouth daily.    Marland Kitchen. levETIRAcetam (KEPPRA) 500 MG tablet Take 500 mg by mouth daily.     Marland Kitchen. LORazepam (ATIVAN) 0.5 MG tablet Take 0.5 mg by mouth every 8 (eight) hours as needed for anxiety. Reported on 02/23/2016    . meclizine (ANTIVERT) 25 MG tablet Take 25 mg by mouth 2 (two) times daily as needed for dizziness.    . methimazole (TAPAZOLE) 10 MG tablet Take 10 mg by mouth every 8 (eight) hours.     . mexiletine (MEXITIL) 150 MG capsule Take 1 capsule (150 mg total) by mouth every 8 (eight) hours. 90 capsule 0  . polyethylene glycol (MIRALAX / GLYCOLAX) packet Take 17 g by mouth daily. 14 each 0  . potassium chloride SA (K-DUR,KLOR-CON) 20 MEQ tablet Take 20 mEq by mouth 2 (two) times daily.    . Rivaroxaban (XARELTO) 15 MG TABS tablet Take 15 mg by mouth daily with supper.    . senna-docusate (SENNALAX-S) 8.6-50 MG tablet Take 1 tablet by mouth every 4 (four) hours as needed for mild constipation.    Marland Kitchen. spironolactone (ALDACTONE) 25 MG tablet Take 25 mg by mouth daily.    . temazepam (RESTORIL) 7.5 MG capsule Take 7.5 mg by mouth at bedtime as needed for sleep.    Marland Kitchen. torsemide (DEMADEX) 10 MG tablet Take 10 mg by mouth daily.    . nitroGLYCERIN (NITROSTAT) 0.4 MG SL tablet Place 1 tablet (0.4 mg total) under the tongue every 5 (five) minutes as needed for chest pain. (Patient not taking: Reported on 04/26/2016) 30 tablet 0   No current facility-administered medications for this encounter.     Allergies  Allergen Reactions  . 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 Marital status: Single    Spouse name: N/A  . Number of children: N/A  . Years of education: N/A   Occupational History  . Not on file.   Social History Main Topics  . Smoking status: Former Games developermoker  . Smokeless tobacco: Never Used     Comment: smoked 1.5 ppd x 30 years, quit in 2012)  . Alcohol use No  . Drug use: No  . Sexual activity: Not on file   Other Topics Concern  . Not on file   Social History Narrative  . No narrative on file      Family History  Problem Relation Age of Onset  . Cardiomyopathy    . Sudden death      Vitals:   04/26/16 1142  BP: 102/80  Pulse: (!) 106  SpO2: 96%  Weight: 163 lb (73.9 kg)   Wt Readings from Last 3 Encounters:  04/26/16 163 lb (73.9 kg)  04/13/16 168 lb (76.2 kg)    04/04/16 166 lb (75.3 kg)    Physical Exam: General: Looks ok. More alert . Ambulated with a rolling walker.  HEENT: normal Neck: supple. JVP 5-6 . Carotids 2+ bilat; no bruits. No  thyromegaly or nodule noted. Cor: PMI nondisplaced. RRR. No rubs, or murmurs. + S3  Lungs: Clear, normal effort Abdomen: soft, NT, ND, no HSM. No bruits or  masses. +BS  Extremities: no cyanosis, clubbing, rash. No peripheral edema Neuro: alert & orientedx3, cranial nerves grossly intact. Weak on left  EKG: NSR 85 bpm  ASSESSMENT & PLAN:   1. /Chronic Systolic Heart Failure: St Jude CRT-D device, not set to BiV pace b/c ineffective.  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 NYHA IIIB. He does not appear volume overloaded.  - Volume status looks good. Continue torsemide 10 daily. Stop potassium  - Continue to hold coreg with concerns for recurrent low output earlier this week.  - Continue lisinopril 2.5 mg daily. - Continue hydralazine 25 mg tid - Today I stopped imdur because he refuses to take it.  - Continue spironolactone 12.5. - Continue digoxin 0.125 mg every other day.   - RHC showed marginal output and low filling pressures.  -For now he is not a candidate for advanced therapies given lack of social support and poor insight related to his heart failure.   2. Atypical chest pain - resolved - LHC with mild/non-obstructive disease. Troponins negative last admission with no changes in symptoms 3. H/O VT- VT shock 7/11.  - Switched from amio to mexilitene with thyroid dysfunction.  - No driving until 01/5536 4. H/O PAF:  In Sinus Rhythm On mexiletine. Not on amio due to hyperthyroidism.  - On coumadin.  INR managed at SNF.   5. CKD Stage II- Check BMET today.  6. H/O PE: On coumadin. INR 2.4  7. Hyperthyroidism: Check TSH T3 T4 today.  Suspect iodine-triggered from amiodarone use.  Off amio.  - Has been started on methimazole. Refer to Endocrinology.   8. Thyroid Nodule - Noted on thyroid ultrasound.  Not big enough for biopsy. 12 month repeat recommended, unless it increases in size  Today I called [facility] to discuss his regimen. Mr Blinn is very frustrated with medication regimen at [facility].  RTC in 4 weeks.   Lynzee Lindquist,11:59 AM

## 2016-04-26 NOTE — Patient Instructions (Signed)
STOP Potassium.  STOP Imdur.  Will refer you to Endocrinology for Hypothyroidism with Dr. Lafe Garin. Address: [address], [place], [place] [address]  Hours: 8am-5pm Monday-Friday Phone: [phone]  Routine lab work today. Will notify you of abnormal results, otherwise no news is good news!  Follow up 4 weeks with Amy Clegg NP-C.  Do the following things EVERYDAY: 1) Weigh yourself in the morning before breakfast. Write it down and keep it in a log. 2) Take your medicines as prescribed 3) Eat low salt foods-Limit salt (sodium) to 2000 mg per day.  4) Stay as active as you can everyday 5) Limit all fluids for the day to less than 2 liters

## 2016-04-26 NOTE — Progress Notes (Signed)
Advanced Heart Failure Medication Review by a Pharmacist  Does the patient  feel that his/her medications are working for him/her?  yes  Has the patient been experiencing any side effects to the medications prescribed?  no  Does the patient measure his/her own blood pressure or blood glucose at home?  no   Does the patient have any problems obtaining medications due to transportation or finances?   no  Understanding of regimen: good Understanding of indications: good Potential of compliance: good Patient understands to avoid NSAIDs. Patient understands to avoid decongestants.  Issues to address at subsequent visits: None   Pharmacist comments:  [name] is a pleasant 70 yo M presenting from [facility] with an updated MAR. No significant discrepancies noted.   [name]. Bonnye Fava, PharmD, BCPS, CPP Clinical Pharmacist Pager: [phone] Phone: [phone] 04/26/2016 11:59 AM      Time with patient: 2 minutes Preparation and documentation time: 10 minutes Total time: 12 minutes

## 2016-04-27 LAB — T4: T4 TOTAL: 21.6 ug/dL — AB (ref 4.5–12.0)

## 2016-04-27 LAB — T3: T3 TOTAL: 264 ng/dL — AB (ref 71–180)

## 2016-05-04 ENCOUNTER — Encounter (HOSPITAL_COMMUNITY): Payer: Medicare Other | Admitting: Internal Medicine

## 2016-05-17 ENCOUNTER — Encounter: Payer: Self-pay | Admitting: Endocrinology

## 2016-05-24 ENCOUNTER — Ambulatory Visit (HOSPITAL_COMMUNITY)
Admission: RE | Admit: 2016-05-24 | Discharge: 2016-05-24 | Disposition: A | Discharge status: Home / Self Care | Payer: Medicare Other | Source: Ambulatory Visit | Attending: Cardiology | Admitting: Cardiology

## 2016-05-24 VITALS — BP 96/62 | HR 88 | Wt 160.6 lb

## 2016-05-24 DIAGNOSIS — I428 Other cardiomyopathies: Secondary | ICD-10-CM | POA: Insufficient documentation

## 2016-05-24 DIAGNOSIS — Z95810 Presence of automatic (implantable) cardiac defibrillator: Secondary | ICD-10-CM

## 2016-05-24 DIAGNOSIS — Z7901 Long term (current) use of anticoagulants: Secondary | ICD-10-CM | POA: Insufficient documentation

## 2016-05-24 DIAGNOSIS — Z86711 Personal history of pulmonary embolism: Secondary | ICD-10-CM | POA: Insufficient documentation

## 2016-05-24 DIAGNOSIS — Z79899 Other long term (current) drug therapy: Secondary | ICD-10-CM | POA: Insufficient documentation

## 2016-05-24 DIAGNOSIS — Z87891 Personal history of nicotine dependence: Secondary | ICD-10-CM | POA: Insufficient documentation

## 2016-05-24 DIAGNOSIS — E041 Nontoxic single thyroid nodule: Secondary | ICD-10-CM | POA: Insufficient documentation

## 2016-05-24 DIAGNOSIS — I5022 Chronic systolic (congestive) heart failure: Secondary | ICD-10-CM | POA: Diagnosis not present

## 2016-05-24 DIAGNOSIS — I480 Paroxysmal atrial fibrillation: Secondary | ICD-10-CM | POA: Diagnosis not present

## 2016-05-24 DIAGNOSIS — N183 Chronic kidney disease, stage 3 (moderate): Secondary | ICD-10-CM | POA: Insufficient documentation

## 2016-05-24 DIAGNOSIS — E0590 Thyrotoxicosis, unspecified without thyrotoxic crisis or storm: Secondary | ICD-10-CM

## 2016-05-24 DIAGNOSIS — Z8673 Personal history of transient ischemic attack (TIA), and cerebral infarction without residual deficits: Secondary | ICD-10-CM | POA: Diagnosis not present

## 2016-05-24 DIAGNOSIS — I130 Hypertensive heart and chronic kidney disease with heart failure and stage 1 through stage 4 chronic kidney disease, or unspecified chronic kidney disease: Secondary | ICD-10-CM | POA: Insufficient documentation

## 2016-05-24 NOTE — Patient Instructions (Signed)
Will refer you to Endocrinology with Dr. Wyonia Hough for Hyperthyroidism. Address: [address], [place], [place] [address]  Hours: 8am-5pm Monday-Friday Phone: [phone]  STOP Torsemide.  Follow up 1 month with Dr. Shirlee Latch.  Do the following things EVERYDAY: 1) Weigh yourself in the morning before breakfast. Write it down and keep it in a log. 2) Take your medicines as prescribed 3) Eat low salt foods-Limit salt (sodium) to 2000 mg per day.  4) Stay as active as you can everyday 5) Limit all fluids for the day to less than 2 liters

## 2016-05-24 NOTE — Progress Notes (Addendum)
Advanced Heart Failure Medication Review by a Pharmacist  Does the patient  feel that his/her medications are working for him/her?  yes  Has the patient been experiencing any side effects to the medications prescribed?  no  Does the patient measure his/her own blood pressure or blood glucose at home?  yes   Does the patient have any problems obtaining medications due to transportation or finances?   no  Understanding of regimen: good Understanding of indications: good Potential of compliance: good Patient understands to avoid NSAIDs. Patient understands to avoid decongestants.  Issues to address at subsequent visits: None   Pharmacist comments:  Johnathan Morrison is a 70 yo M presenting from [facility] with an updated MAR. I did notice that hydralazine was not on his MAR so this was updated on our list.   Johnathan Morrison, PharmD, BCPS, CPP Clinical Pharmacist Pager: [phone] Phone: [phone] 05/24/2016 11:11 AM      Time with patient: 2 minutes Preparation and documentation time: 8 minutes Total time: 10 minutes

## 2016-05-24 NOTE — Progress Notes (Signed)
Patient ID: Johnathan Morrison, male   DOB: 1945-12-31, 70 y.o.   MRN: [id number]    Advanced Heart Failure Clinic Note    Primary Care: [facility] Primary Cardiologist: Dr. Diona Browner Primary HF: Dr. Shirlee Latch   HPI: [name] is a 70 y.o. 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  He was admitted to [facility] 02/23/16 with SOB and ? Syncopal episode. Noted to have 30 beats NSVT. ICD interrogation showed 2 shocks. Transferred to [facility] for HF and EP evaluation.  Echo with LVEF 15-20% range. Started on milrinone and weaned off prior to d/c. He was over diuresed with CVP around 2 and received gentle IVF and diuretics adjusted. Also treated for UA. No driving until 11/22/79 with ICD shocks  He presented to ED on 03/03/16 with lightheadedness and received IV fluid and diuretics held.  He had very close follow up scheduled but again reported to ED on 03/05/16 with chest pain, dyspnea, and poor appetite. Had repeat RHC that  Admission with marginal, but preserved Cardiac Output.   He was being considered as a VAD. We decided this admission that we would like to have his other co-morbidities treated, including his hyperthyroidism. He was discharged to SNF.  Today he returns for HF follow up. Overall feels fair. He has been referred to Endocrinology but has not been sen. Denies PND/Orthopnea. Weight at SNF 158-160 pounds. Ambulates without a walker.  Does not like the food at the facility. Says he is not talking to any of his family. All medications provided at [facility].    RHC  03/08/16  RA mean 2 RV 32/2 PA 31/12, mean 20 PCWP mean 7 Oxygen saturations: PA 69% AO 99% Cardiac Output (Fick) 4.27  Cardiac Index (Fick) 2.15  RHC 02/25/16  RA mean 9 RV 39/10 PA 36/12, mean  24 PCWP mean 13 LV 79/18 AO 82/56 Oxygen saturations: PA 54% AO 96% Cardiac Output (Fick) 3.94  Cardiac Index (Fick) 1.89    Past Medical History:  Diagnosis Date  . Atrial fibrillation (HCC)   . Cardiomyopathy (HCC)   . Chronic renal disease, stage III   . Chronic systolic heart failure (HCC)   . COPD (chronic obstructive pulmonary disease) (HCC)   . 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 Diastolic dysfunction    Grade 3  . Essential hypertension   . PE (pulmonary embolism) March 2016  . Pulmonary HTN    Rt heart cath June 2016  . SAH (subarachnoid hemorrhage) (HCC) Dec 2015  . Seizures (HCC)     Current Outpatient Prescriptions  Medication Sig Dispense Refill  . acetaminophen (TYLENOL) 325 MG tablet Take 2 tablets (650 mg total) by mouth every 4 (four) hours as needed for headache or mild pain.    Marland Kitchen albuterol (PROVENTIL HFA;VENTOLIN HFA) 108 (90 Base) MCG/ACT inhaler Inhale 2 puffs into the lungs every 6 (six) hours as needed for wheezing or shortness of breath. 1 Inhaler 0  . atorvastatin (LIPITOR) 40 MG tablet Take 40 mg by mouth every morning.   0  . digoxin (LANOXIN) 0.125 MG tablet Take 125 mcg by mouth daily.  6  . guaiFENesin (MUCINEX) 600 MG 12 hr tablet Take 2 tablets (1,200 mg total) by mouth 2 (two) times daily. 120 tablet 6  .  levETIRAcetam (KEPPRA) 500 MG tablet Take 500 mg by mouth daily.     Marland Kitchen LORazepam (ATIVAN) 0.5 MG tablet Take 0.5 mg by mouth every 8 (eight) hours as needed for anxiety. Reported on 02/23/2016    . meclizine (ANTIVERT) 25 MG tablet Take 25 mg by mouth 2 (two) times daily as needed for dizziness.    . Melatonin 3 MG TABS Take 3 mg by mouth at bedtime.    . methimazole (TAPAZOLE) 10 MG tablet Take 10 mg by mouth every 8 (eight) hours.    . mexiletine (MEXITIL) 150 MG capsule Take 1 capsule (150 mg total) by mouth every 8 (eight) hours. 90 capsule 0  . omeprazole (PRILOSEC) 20 MG capsule Take 20 mg by mouth daily.    .  polyethylene glycol (MIRALAX / GLYCOLAX) packet Take 17 g by mouth daily. 14 each 0  . Rivaroxaban (XARELTO) 15 MG TABS tablet Take 15 mg by mouth daily with supper.    . senna (SENOKOT) 8.6 MG tablet Take 1 tablet by mouth every 4 (four) hours as needed for constipation.    . sertraline (ZOLOFT) 50 MG tablet Take 50 mg by mouth daily.    Marland Kitchen spironolactone (ALDACTONE) 25 MG tablet Take 25 mg by mouth daily.    . temazepam (RESTORIL) 7.5 MG capsule Take 7.5 mg by mouth at bedtime as needed for sleep.    Marland Kitchen torsemide (DEMADEX) 10 MG tablet Take 10 mg by mouth daily.    . traMADol (ULTRAM) 50 MG tablet Take 50 mg by mouth every 6 (six) hours as needed for moderate pain or severe pain.    . nitroGLYCERIN (NITROSTAT) 0.4 MG SL tablet Place 1 tablet (0.4 mg total) under the tongue every 5 (five) minutes as needed for chest pain. (Patient not taking: Reported on 12-Jun-2016) 30 tablet 0   No current facility-administered medications for this encounter.     Allergies  Allergen Reactions  . 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 Marital status: Single    Spouse name: N/A  . Number of children: N/A  . Years of education: N/A   Occupational History  . Not on file.   Social History Main Topics  . Smoking status: Former Games developer  . Smokeless tobacco: Never Used     Comment: smoked 1.5 ppd x 30 years, quit in 2012)  . Alcohol use No  . Drug use: No  . Sexual activity: Not on file   Other Topics Concern  . Not on file   Social History Narrative  . No narrative on file      Family History  Problem Relation Age of Onset  . Cardiomyopathy    . Sudden death      Vitals:   06/12/2016 1100  BP: 96/62  Pulse: 88  SpO2: 97%  Weight: 160 lb 9.6 oz (72.8 kg)   Wt Readings from Last 3 Encounters:  2016-06-12 160 lb 9.6 oz (72.8 kg)  04/26/16 163 lb (73.9 kg)  04/13/16 168 lb (76.2 kg)    Physical  Exam: General: Looks ok. More alert . Ambulated without assistive devices. Marland Kitchen  HEENT: normal Neck: supple. JVP 5-6 . Carotids 2+ bilat; no bruits. No  thyromegaly or nodule noted. Cor: PMI nondisplaced. RRR. No rubs, or murmurs. + S3  Lungs: Clear, normal effort Abdomen: soft, NT, ND, no HSM. No bruits or masses. +BS  Extremities: no cyanosis, clubbing, rash. No peripheral edema Neuro: alert & orientedx3, cranial nerves grossly intact.    ASSESSMENT & PLAN:   1.Chronic Systolic Heart Failure: St Jude CRT-D device, not set to BiV pace b/c ineffective.  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 NYHA IIIB. He does not appear volume overloaded.  - Volume status low. Stop torsemide.  - Continue to hold coreg with concerns for recurrent low output earlier this week.  -Off hydralazine due to hypotension.  - Refuses to take imdur.   - Continue spironolactone 12.5. - Continue digoxin 0.125 mg every other day.   -For now he is not a candidate for advanced therapies given lack of social support and poor insight related to his heart failure.   2. Atypical chest pain - resolved - LHC with mild/non-obstructive disease. Troponins negative last admission with no changes in symptoms 3. H/O VT- VT shock 7/11.  - Switched from amio to mexilitene with thyroid dysfunction.  - No driving until 1/61091/2018 4. H/O PAF:  In Sinus Rhythm On mexiletine. Not on amio due to hyperthyroidism.  - On coumadin.  INR managed at SNF.   5. CKD Stage II-  Renal function from 05/03/2016 stable.   6. H/O PE: On coumadin. INR 2.4  7. 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  Off amio.  - Has been started on methimazole. He has been referre to Endocrinology not sure why he hasnt had the appointment. We will set up again today.  8. Thyroid Nodule - Noted on thyroid ultrasound. Not big enough for biopsy. 12 month repeat recommended, unless it increases in size   Follow up in 4 weeks with Dr Shirlee LatchMcLean.   Dulcie Gammon  NP-C ,11:24 AM

## 2016-06-07 ENCOUNTER — Encounter (HOSPITAL_COMMUNITY): Payer: Medicare Other

## 2016-06-13 ENCOUNTER — Telehealth: Payer: Self-pay | Admitting: *Deleted

## 2016-06-13 ENCOUNTER — Other Ambulatory Visit: Payer: Self-pay | Admitting: *Deleted

## 2016-06-13 ENCOUNTER — Emergency Department (HOSPITAL_COMMUNITY): Payer: Medicare Other

## 2016-06-13 ENCOUNTER — Encounter (HOSPITAL_COMMUNITY): Payer: Self-pay

## 2016-06-13 ENCOUNTER — Emergency Department (HOSPITAL_COMMUNITY)
Admission: EM | Admit: 2016-06-13 | Discharge: 2016-06-13 | Disposition: A | Discharge status: Home / Self Care | Payer: Medicare Other | Attending: Emergency Medicine | Admitting: Emergency Medicine

## 2016-06-13 DIAGNOSIS — R079 Chest pain, unspecified: Secondary | ICD-10-CM | POA: Diagnosis present

## 2016-06-13 DIAGNOSIS — I5022 Chronic systolic (congestive) heart failure: Secondary | ICD-10-CM | POA: Diagnosis not present

## 2016-06-13 DIAGNOSIS — I472 Ventricular tachycardia, unspecified: Secondary | ICD-10-CM

## 2016-06-13 DIAGNOSIS — I110 Hypertensive heart disease with heart failure: Secondary | ICD-10-CM | POA: Diagnosis not present

## 2016-06-13 DIAGNOSIS — N183 Chronic kidney disease, stage 3 (moderate): Secondary | ICD-10-CM | POA: Diagnosis not present

## 2016-06-13 DIAGNOSIS — Z87891 Personal history of nicotine dependence: Secondary | ICD-10-CM | POA: Diagnosis not present

## 2016-06-13 DIAGNOSIS — J449 Chronic obstructive pulmonary disease, unspecified: Secondary | ICD-10-CM | POA: Diagnosis not present

## 2016-06-13 DIAGNOSIS — Z79899 Other long term (current) drug therapy: Secondary | ICD-10-CM | POA: Diagnosis not present

## 2016-06-13 LAB — CBC
HEMATOCRIT: 37.5 % — AB (ref 39.0–52.0)
HEMOGLOBIN: 12.4 g/dL — AB (ref 13.0–17.0)
MCH: 30.9 pg (ref 26.0–34.0)
MCHC: 33.1 g/dL (ref 30.0–36.0)
MCV: 93.5 fL (ref 78.0–100.0)
Platelets: 196 10*3/uL (ref 150–400)
RBC: 4.01 MIL/uL — AB (ref 4.22–5.81)
RDW: 15.4 % (ref 11.5–15.5)
WBC: 5 10*3/uL (ref 4.0–10.5)

## 2016-06-13 LAB — MAGNESIUM: MAGNESIUM: 1.7 mg/dL (ref 1.7–2.4)

## 2016-06-13 LAB — I-STAT TROPONIN, ED: Troponin i, poc: 0.01 ng/mL (ref 0.00–0.08)

## 2016-06-13 LAB — BASIC METABOLIC PANEL
ANION GAP: 6 (ref 5–15)
BUN: 21 mg/dL — ABNORMAL HIGH (ref 6–20)
CHLORIDE: 106 mmol/L (ref 101–111)
CO2: 24 mmol/L (ref 22–32)
Calcium: 9.4 mg/dL (ref 8.9–10.3)
Creatinine, Ser: 1.21 mg/dL (ref 0.61–1.24)
GFR, EST NON AFRICAN AMERICAN: 60 mL/min — AB (ref >60)
Glucose, Bld: 105 mg/dL — ABNORMAL HIGH (ref 65–99)
POTASSIUM: 3.8 mmol/L (ref 3.5–5.1)
SODIUM: 136 mmol/L (ref 135–145)

## 2016-06-13 LAB — T4, FREE: Free T4: 1.5 ng/dL — ABNORMAL HIGH (ref 0.61–1.12)

## 2016-06-13 MED ORDER — MEXILETINE HCL 200 MG PO CAPS: 200.0000 mg | ORAL_CAPSULE | Freq: Three times a day (TID) | ORAL | 6 refills | Status: AC

## 2016-06-13 NOTE — Progress Notes (Addendum)
Patient discussed with ER staff, ICD interrogation reviewed. Appropriate shock for VT. Patient history of systolic dysfunction and prior VT, has ICD. Has been on mexilitene due to thryoid problems on amio. Has not been on beta blockers due to prior low output.  Last ICD shock 02/2016 from last EP note. Discussed with Dr Ladona Ridgel initially, later Dr Johney Frame as well. We will increase mexilitene to 200mg  tid, have patient f/u with Dr Johney Frame in [place] office.   Dominga Ferry MD

## 2016-06-13 NOTE — Telephone Encounter (Signed)
Spoke to RN from [facility] who states that patient was shocked today by his ICD. She states that he is unable to send in a remote transmission. She says that he has not c/o CP, ShOB, or dizziness today, but did c/o CP a couple of weeks ago. She said that he has not had any medication changes.  I told her that she needs to send patient to ED if he is shocked again or develops sx's.  Will forward to Glynda Jaeger to schedule appt with Dr.Allred or APP. (Patient lives in [place], but is able to come to [facility]).

## 2016-06-13 NOTE — ED Provider Notes (Signed)
[facility]-EMERGENCY DEPT Provider Note   CSN: [id number] Arrival date & time: 06/13/16  1136  By signing my name below, I, Johnathan Morrison, attest that this documentation has been prepared under the direction and in the presence of Johnathan BerkshireJoseph Mikiah Demond, MD  Electronically Signed: Clovis PuAvnee Morrison, ED Scribe. 06/13/16. 12:02 PM.   History   Chief Complaint Chief Complaint  Patient presents with  . Chest Pain   The history is provided by the patient. No language interpreter was used.  Chest Pain   This is a new problem. The current episode started 1 to 2 hours ago. The problem has been gradually improving. The pain is mild. The pain radiates to the left arm. Associated symptoms include dizziness, headaches and numbness. Pertinent negatives include no abdominal pain, no back pain and no cough. He has tried nothing for the symptoms.  His past medical history is significant for COPD.  Pertinent negatives for past medical history include no seizures.  Procedure history is positive for cardiac catheterization.   HPI Comments:  Johnathan Morrison is a 70 y.o. male, with a hx of COPD, who presents to the Emergency Department complaining of L sided chest pain which began PTA. Pt states his defibrillator went off about an hour ago. He also notes headaches, dizziness, and numbness to his LUE. Pt states he experienced a similar episode about 1 month ago. No alleviating factors noted. Pt denies any other symptoms or complaints at this time.  Past Medical History:  Diagnosis Date  . Atrial fibrillation (HCC)   . Cardiomyopathy (HCC)   . Chronic renal disease, stage III   . Chronic systolic heart failure (HCC)   . COPD (chronic obstructive pulmonary disease) (HCC)   . 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 Diastolic dysfunction    Grade 3  . Essential hypertension   . PE (pulmonary embolism) March 2016  . Pulmonary HTN    Rt heart cath June 2016  . SAH (subarachnoid hemorrhage) (HCC) Dec 2015  .  Seizures [facility](HCC)     Patient Active Problem List   Diagnosis Date Noted  . Protein-calorie malnutrition, severe 03/08/2016  . Severe protein-energy malnutrition (HCC) 03/08/2016  . Hyperthyroidism   . Goals of care, counseling/discussion   . Palliative care encounter   . Loss of weight   . Chest pain 03/05/2016  . NSVT (nonsustained ventricular tachycardia) (HCC) 02/24/2016  . Chronic renal disease, stage III   . Diastolic dysfunction   . Pulmonary HTN - Rt heart cath June 2016   . COPD (chronic obstructive pulmonary disease) (HCC)   . History of PE-March 2016   . Cardiomyopathy-presumably NICM   . Acute on chronic systolic congestive heart failure (HCC)   . A-fib (HCC) 02/23/2016  . Chronic anticoagulation 02/23/2016  . 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 HTN (hypertension) 02/23/2016  . History of seizure 02/23/2016  . Acute on chronic systolic (congestive) heart failure 02/23/2016  . 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 SAH (subarachnoid hemorrhage)-Dec 2015 07/15/2014    Past Surgical History:  Procedure Laterality Date  . ANKLE SURGERY    . CARDIAC CATHETERIZATION N/A 02/25/2016   Procedure: Right/Left Heart Cath and Coronary Angiography;  Surgeon: Laurey Moralealton S McLean, MD;  [facility];  Service: Cardiovascular;  Laterality: N/A;  . CARDIAC CATHETERIZATION N/A 03/08/2016   Procedure: Right Heart Cath;  Surgeon: Laurey Moralealton S McLean, MD;  [facility];  Service: Cardiovascular;  Laterality: N/A;  .  CORONARY ANGIOPLASTY    . CORONARY ANGIOPLASTY    . INSERTION OF ICD  09/25/12   Bi V [employer] Jude   . IVC FILTER PLACEMENT [facility] HX)  March 2016  . IVC FILTER REMOVAL [facility] HX)  Oct 2016  . TOTAL KNEE ARTHROPLASTY Bilateral        Home Medications    Prior to Admission medications   Medication Sig Start Date End Date Taking? Authorizing Provider  acetaminophen (TYLENOL) 325 MG tablet Take 2 tablets (650 mg total) by mouth every 4  (four) hours as needed for headache or mild pain. 03/10/16   Renae Fickle, MD  albuterol (PROVENTIL HFA;VENTOLIN HFA) 108 (90 Base) MCG/ACT inhaler Inhale 2 puffs into the lungs every 6 (six) hours as needed for wheezing or shortness of breath. 02/12/16   [name], MD  atorvastatin (LIPITOR) 40 MG tablet Take 40 mg by mouth every morning.  01/27/16   Historical Provider, MD  digoxin (LANOXIN) 0.125 MG tablet Take 125 mcg by mouth daily. 03/01/16   Historical Provider, MD  guaiFENesin (MUCINEX) 600 MG 12 hr tablet Take 2 tablets (1,200 mg total) by mouth 2 (two) times daily. 03/25/16   Dolores Patty, MD  levETIRAcetam (KEPPRA) 500 MG tablet Take 500 mg by mouth daily.     Historical Provider, MD  LORazepam (ATIVAN) 0.5 MG tablet Take 0.5 mg by mouth every 8 (eight) hours as needed for anxiety. Reported on 02/23/2016    Historical Provider, MD  meclizine (ANTIVERT) 25 MG tablet Take 25 mg by mouth 2 (two) times daily as needed for dizziness.    Historical Provider, MD  Melatonin 3 MG TABS Take 3 mg by mouth at bedtime.    Historical Provider, MD  methimazole (TAPAZOLE) 10 MG tablet Take 10 mg by mouth every 8 (eight) hours.    Historical Provider, MD  mexiletine (MEXITIL) 150 MG capsule Take 1 capsule (150 mg total) by mouth every 8 (eight) hours. 03/10/16   Renae Fickle, MD  nitroGLYCERIN (NITROSTAT) 0.4 MG SL tablet Place 1 tablet (0.4 mg total) under the tongue every 5 (five) minutes as needed for chest pain. Patient not taking: Reported on 05/24/2016 02/03/16   Devoria Albe, MD  omeprazole (PRILOSEC) 20 MG capsule Take 20 mg by mouth daily.    Historical Provider, MD  polyethylene glycol (MIRALAX / GLYCOLAX) packet Take 17 g by mouth daily. 03/10/16   Renae Fickle, MD  Rivaroxaban (XARELTO) 15 MG TABS tablet Take 15 mg by mouth daily with supper.    Historical Provider, MD  senna (SENOKOT) 8.6 MG tablet Take 1 tablet by mouth every 4 (four) hours as needed for constipation.    Historical  Provider, MD  sertraline (ZOLOFT) 50 MG tablet Take 50 mg by mouth daily.    Historical Provider, MD  spironolactone (ALDACTONE) 25 MG tablet Take 25 mg by mouth daily.    Historical Provider, MD  temazepam (RESTORIL) 7.5 MG capsule Take 7.5 mg by mouth at bedtime as needed for sleep.    Historical Provider, MD  traMADol (ULTRAM) 50 MG tablet Take 50 mg by mouth every 6 (six) hours as needed for moderate pain or severe pain.    Historical Provider, MD    Family History Family History  Problem Relation Age of Onset  . Cardiomyopathy    . Sudden death      Social History Social History  Substance Use Topics  . Smoking status: Former Games developer  . Smokeless tobacco: Never Used  Comment: smoked 1.5 ppd x 30 years, quit in 2012)  . Alcohol use No     Allergies   Amiodarone   Review of Systems Review of Systems  Constitutional: Negative for appetite change and fatigue.  HENT: Negative for congestion, ear discharge and sinus pressure.   Eyes: Negative for discharge.  Respiratory: Negative for cough.   Cardiovascular: Positive for chest pain.  Gastrointestinal: Negative for abdominal pain and diarrhea.  Genitourinary: Negative for frequency and hematuria.  Musculoskeletal: Positive for myalgias. Negative for back pain.  Skin: Negative for rash.  Neurological: Positive for dizziness, numbness and headaches. Negative for seizures.  Psychiatric/Behavioral: Negative for hallucinations.     Physical Exam Updated Vital Signs BP 104/76 (BP Location: Right Arm)   Pulse 84   Temp 97.5 F (36.4 C) (Oral)   Resp 17   Ht 6\' 3"  (1.905 m)   Wt 163 lb (73.9 kg)   SpO2 98%   BMI 20.37 kg/m   Physical Exam  Constitutional: He is oriented to person, place, and time. He appears well-developed.  HENT:  Head: Normocephalic.  Eyes: Conjunctivae and EOM are normal. No scleral icterus.  Neck: Neck supple. No thyromegaly present.  Cardiovascular: Normal rate and regular rhythm.  Exam  reveals no gallop and no friction rub.   No murmur heard. Pulmonary/Chest: No stridor. He has no wheezes. He has no rales. He exhibits tenderness.  Minor tenderness to L chest.   Abdominal: He exhibits no distension. There is no tenderness. There is no rebound.  Musculoskeletal: Normal range of motion. He exhibits no edema.  Lymphadenopathy:    He has no cervical adenopathy.  Neurological: He is oriented to person, place, and time. He exhibits normal muscle tone. Coordination normal.  Skin: No rash noted. No erythema.  Psychiatric: He has a normal mood and affect. His behavior is normal.  Nursing note and vitals reviewed.    ED Treatments / Results  DIAGNOSTIC STUDIES:  Oxygen Saturation is 98% on RA, normal by my interpretation.    COORDINATION OF CARE:  11:57 AM Discussed treatment plan with pt at bedside and pt agreed to plan.  Labs (all labs ordered are listed, but only abnormal results are displayed) Labs Reviewed  BASIC METABOLIC PANEL  CBC  I-STAT TROPOININ, ED    EKG  EKG Interpretation None       Radiology No results found.  Procedures Procedures (including critical care time)  Medications Ordered in ED Medications - No data to display   Initial Impression / Assessment and Plan / ED Course  I have reviewed the triage vital signs and the nursing notes.  Pertinent labs & imaging results that were available during my care of the patient were reviewed by me and considered in my medical decision making (see chart for details).  Clinical Course    Patient had his defibrillator fired today. He will get this interrogated and cardiology will be called  Final Clinical Impressions(s) / ED Diagnoses   Final diagnoses:  None    New Prescriptions New Prescriptions   No medications on file  The chart was scribed for me under my direct supervision.  I personally performed the history, physical, and medical decision making and all procedures in the  evaluation of this patient.Johnathan Berkshire, MD 06/13/16 [phone]

## 2016-06-13 NOTE — ED Triage Notes (Signed)
Pt states his defibrillator went off about an hour ago. States it woke him up and he is having pain in the left side of his chest now.

## 2016-06-13 NOTE — ED Notes (Signed)
[employer] called and has rep on way. nad

## 2016-06-13 NOTE — Progress Notes (Unsigned)
Orders placed, appt made for Dr. Johney Frame in [place]

## 2016-06-13 NOTE — ED Notes (Signed)
Pt taken to xray 

## 2016-06-13 NOTE — ED Notes (Signed)
[employer] rep here at this time. No change in pt

## 2016-06-13 NOTE — ED Notes (Signed)
[facility] called for transportation

## 2016-06-14 LAB — T3: T3, Total: 136 ng/dL (ref 71–180)

## 2016-06-20 ENCOUNTER — Encounter: Payer: Self-pay | Admitting: Internal Medicine

## 2016-06-20 ENCOUNTER — Ambulatory Visit (INDEPENDENT_AMBULATORY_CARE_PROVIDER_SITE_OTHER): Payer: Medicare Other | Admitting: Internal Medicine

## 2016-06-20 VITALS — BP 100/62 | HR 104 | Ht 73.0 in | Wt 158.0 lb

## 2016-06-20 DIAGNOSIS — E0590 Thyrotoxicosis, unspecified without thyrotoxic crisis or storm: Secondary | ICD-10-CM | POA: Diagnosis not present

## 2016-06-20 LAB — T4, FREE: Free T4: 1.23 ng/dL (ref 0.60–1.60)

## 2016-06-20 LAB — T3, FREE: T3 FREE: 3.8 pg/mL (ref 2.3–4.2)

## 2016-06-20 LAB — TSH

## 2016-06-20 NOTE — Progress Notes (Signed)
Patient ID: Johnathan Morrison, male   DOB: 06-07-1946, 70 y.o.   MRN: [id number]    HPI  Johnathan Morrison is a 70 y.o.-year-old male, referred by his PCP, Dr. Shirlee Latch, for evaluation and management of thyrotoxicosis. He moved to [place] from [place] this summer.  Patient does not remember exactly when he was diagnosed with thyroid dysfunction. Per records from [facility]) >> pt has Toxic multinodular goiter, however, I do not have records supporting this. He cannot remember whether he was on Amiodarone before, but I see that this was given in 02/2016 during his hospitalization.  Pt has a h/o stroke (2015) >> lost 100 lbs since then.   He also has NICMP, CHF- EF 15-20%, A fib, on ICD. He was recently admitted with CP >> VTach 06/13/2016. His ICD fired more frequently.  He had a chest CT angio (02/12/2016).   He also had a thyroid U/S (03/08/2016) showing almost no blood flow.  I reviewed pt's thyroid tests: Lab Results  Component Value Date   TSH 0.041 (L) 04/26/2016   TSH 0.027 (L) 03/07/2016   TSH 0.138 (L) 02/23/2016   FREET4 1.50 (H) 06/13/2016   FREET4 5.38 (H) 03/07/2016   FREET4 5.24 (H) 03/05/2016    He was started on MMI 10 mg 3x a day. He continues this today.  Pt denies feeling nodules in neck, hoarseness, dysphagia/odynophagia, SOB with lying down; he c/o: - + weight loss - + fatigue; + poor sleep - + excessive sweating/heat intolerance - + tremors - no anxiety - on meds - + palpitations - no hyperdefecation  Pt does not have a FH of thyroid ds. No FH of thyroid cancer. No h/o radiation tx to head or neck.  No seaweed or kelp, no recent contrast studies. No steroid use. No herbal supplements. No Biotin use.  Pt. also has a history of seizures after his stroke >> now on Keppra; CKD; protein-caloric malnutrition.  ROS: Constitutional: + see HPI Eyes: no blurry vision, no xerophthalmia ENT: no sore throat, + see HPI Cardiovascular: no CP/SOB/+  palpitations/no leg swelling Respiratory: no cough/SOB Gastrointestinal: no N/V/D/C Musculoskeletal: no muscle/joint aches Skin: no rashes Neurological: + tremors/no numbness/tingling/dizziness Psychiatric: no depression/+ anxiety  Past Medical History:  Diagnosis Date  . Atrial fibrillation (HCC)   . Cardiomyopathy (HCC)   . Chronic renal disease, stage III   . Chronic systolic heart failure (HCC)   . COPD (chronic obstructive pulmonary disease) (HCC)   . 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 Diastolic dysfunction    Grade 3  . Essential hypertension   . PE (pulmonary embolism) March 2016  . Pulmonary HTN    Rt heart cath June 2016  . SAH (subarachnoid hemorrhage) (HCC) Dec 2015  . Seizures (HCC)    Past Surgical History:  Procedure Laterality Date  . ANKLE SURGERY    . CARDIAC CATHETERIZATION N/A 02/25/2016   Procedure: Right/Left Heart Cath and Coronary Angiography;  Surgeon: Laurey Morale, MD;  Location: [facility] INVASIVE CV LAB;  Service: Cardiovascular;  Laterality: N/A;  . CARDIAC CATHETERIZATION N/A 03/08/2016   Procedure: Right Heart Cath;  Surgeon: Laurey Morale, MD;  Location: [facility] INVASIVE CV LAB;  Service: Cardiovascular;  Laterality: N/A;  . CORONARY ANGIOPLASTY    . CORONARY ANGIOPLASTY    . INSERTION OF ICD  09/25/12   Bi V [employer]   . IVC FILTER PLACEMENT [facility] HX)  March 2016  . IVC FILTER REMOVAL ([facility] HX)  Oct 2016  . TOTAL KNEE ARTHROPLASTY Bilateral    Social History   Social History  . Marital status: Single    Spouse name: N/A  . Number of children: N/A  . Years of education: N/A   Occupational History  . Not on file.   Social History Main Topics  . Smoking status: Former Games developermoker  . Smokeless tobacco: Never Used     Comment: smoked 1.5 ppd x 30 years, quit in 2012)  . Alcohol use No  . Drug use: No  . Sexual activity: Not on file   Other Topics Concern  . Not on file   Social History Narrative  . No narrative on file    Current Outpatient Prescriptions on File Prior to Visit  Medication Sig Dispense Refill  . acetaminophen (TYLENOL) 325 MG tablet Take 2 tablets (650 mg total) by mouth every 4 (four) hours as needed for headache or mild pain.    Marland Kitchen. albuterol (PROVENTIL HFA;VENTOLIN HFA) 108 (90 Base) MCG/ACT inhaler Inhale 2 puffs into the lungs every 6 (six) hours as needed for wheezing or shortness of breath. 1 Inhaler 0  . atorvastatin (LIPITOR) 40 MG tablet Take 40 mg by mouth every morning.   0  . digoxin (LANOXIN) 0.125 MG tablet Take 125 mcg by mouth daily.  6  . guaiFENesin (MUCINEX) 600 MG 12 hr tablet Take 2 tablets (1,200 mg total) by mouth 2 (two) times daily. 120 tablet 6  . levETIRAcetam (KEPPRA) 500 MG tablet Take 500 mg by mouth daily.     Marland Kitchen. LORazepam (ATIVAN) 0.5 MG tablet Take 0.5 mg by mouth every 8 (eight) hours as needed for anxiety. Reported on 02/23/2016    . meclizine (ANTIVERT) 25 MG tablet Take 25 mg by mouth 2 (two) times daily as needed for dizziness.    . Melatonin 3 MG TABS Take 3 mg by mouth at bedtime.    . methimazole (TAPAZOLE) 10 MG tablet Take 10 mg by mouth every 8 (eight) hours.    . mexiletine (MEXITIL) 200 MG capsule Take 1 capsule (200 mg total) by mouth 3 (three) times daily. 90 capsule 6  . omeprazole (PRILOSEC) 20 MG capsule Take 20 mg by mouth daily.    . polyethylene glycol (MIRALAX / GLYCOLAX) packet Take 17 g by mouth daily. 14 each 0  . Rivaroxaban (XARELTO) 15 MG TABS tablet Take 15 mg by mouth daily with supper.    . senna (SENOKOT) 8.6 MG tablet Take 1 tablet by mouth every 4 (four) hours as needed for constipation.    . sertraline (ZOLOFT) 50 MG tablet Take 50 mg by mouth daily.    Marland Kitchen. spironolactone (ALDACTONE) 25 MG tablet Take 25 mg by mouth daily.    . temazepam (RESTORIL) 7.5 MG capsule Take 7.5 mg by mouth at bedtime as needed for sleep.    . traMADol (ULTRAM) 50 MG tablet Take 50 mg by mouth every 6 (six) hours as needed for moderate pain or severe pain.     . nitroGLYCERIN (NITROSTAT) 0.4 MG SL tablet Place 1 tablet (0.4 mg total) under the tongue every 5 (five) minutes as needed for chest pain. (Patient not taking: Reported on 06/20/2016) 30 tablet 0   No current facility-administered medications on file prior to visit.    Allergies  Allergen Reactions  . 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   Family History  Problem Relation Age of Onset  . Cardiomyopathy    .  Sudden death      PE: BP 100/62 (BP Location: Left Arm, Patient Position: Sitting)   Pulse (!) 104   Ht 6\' 1"  (1.854 m)   Wt 158 lb (71.7 kg)   SpO2 97%   BMI 20.85 kg/m  Wt Readings from Last 3 Encounters:  06/20/16 158 lb (71.7 kg)  06/13/16 163 lb (73.9 kg)  05/24/16 160 lb 9.6 oz (72.8 kg)   Constitutional: thin, in NAD Eyes: PERRLA, EOMI, no exophthalmos, no lid lag, no stare ENT: moist mucous membranes, no thyromegaly, no thyroid bruits, no cervical lymphadenopathy Cardiovascular: RRR, No MRG Respiratory: CTA B Gastrointestinal: abdomen soft, NT, ND, BS+ Musculoskeletal: no deformities, strength intact in all 4 Skin: moist, warm, no rashes Neurological: + mild tremor with outstretched hands, DTR normal in all 4  ASSESSMENT: 1. Thyrotoxicosis  PLAN:  1. Patient with a recently found low TSH, with thyrotoxic sxs: weight loss, heat intolerance, hyperdefecation, palpitations, anxiety.  - he does not appear to have exogenous causes for the low TSH.  - We discussed that possible causes of thyrotoxicosis are:  Graves ds  (unlikely with decreased blood flow on U/S, but he does appear to have had hyperthyroidism since ~ 2015 >> drastic weight loss after his stroke) Thyroiditis  toxic multinodular goiter/ toxic adenoma (no nodules at palpation of his thyroid and on recent thyroid U/S. He has this in records from the SNF, but I do not have records of a thyroid Uptake and scan). - I suggested that we check the TSH, fT3  and fT4 and also add thyroid stimulating antibodies to screen for Graves' disease.  - If the tests remain abnormal, we may need an uptake and scan to differentiate between the 3 above possible etiologies  - we discussed about possible modalities of treatment for the above conditions, to include methimazole use, radioactive iodine ablation or (last resort) surgery. Due to his severe heart ds. >> we need definitive tx. - he is not on beta blockers, but he is tachycardic >> will not add Toprol for now - will check with cardiology - I advised him to join my chart to communicate easier but does not have a computer - RTC in 3 months, but likely sooner for repeat labs  Component     Latest Ref Rng & Units 06/20/2016  TSH     0.35 - 4.50 uIU/mL 0.13 Repeated and verified X2. (L)  T4,Free(Direct)     0.60 - 1.60 ng/dL 1.61  Triiodothyronine,Free,Serum     2.3 - 4.2 pg/mL 3.8  TSI     <140 % baseline 240 (H)   Patient has elevated TSI antibodies and may point towards a diagnosis of Graves' disease. I would like to obtain an uptake and scan to clarify (I believe that this will be accurate now 4 months after his amiodarone dosing). If his diagnosis is Graves' disease, he can remain on methimazole and we can attempt to decrease the dose in the future. If his diagnosis is toxic multinodular goiter, I would suggest RAI treatment .  For now, I will suggest that he remains on methimazole, but I will decrease the dose to 10 mg twice a day  since his free thyroid hormones have normalized and I will advise him to stop the methimazole 4 days prior to the uptake and scan. He is then to restart methimazole right after the scan.  I would like to check another set of labs in 1.5 months.  I will addend the results  of the uptake and scan when they become available.  Carlus Pavlov, MD PhD [facility] Endocrinology

## 2016-06-20 NOTE — Patient Instructions (Addendum)
Please continue Methimazole 10 mg 3x a day.  Please stop at the lab.  If the results are still abnormal, we may need a thyroid uptake and scan. If we need to do this test, you need to stop the Methimazole 4 days before the test and start it immediately after the test.  Please come back for a follow-up appointment in 3 months.   Hyperthyroidism Hyperthyroidism is when the thyroid is too active (overactive). Your thyroid is a large gland that is located in your neck. The thyroid helps to control how your body uses food (metabolism). When your thyroid is overactive, it produces too much of a hormone called thyroxine.  CAUSES Causes of hyperthyroidism may include:  Graves disease. This is when your immune system attacks the thyroid gland. This is the most common cause.  Inflammation of the thyroid gland.  Tumor in the thyroid gland or somewhere else.  Excessive use of thyroid medicines, including:  Prescription thyroid supplement.  Herbal supplements that mimic thyroid hormones.  Solid or fluid-filled lumps within your thyroid gland (thyroid nodules).  Excessive ingestion of iodine. RISK FACTORS  Being male.  Having a family history of thyroid conditions. SIGNS AND SYMPTOMS Signs and symptoms of hyperthyroidism may include:  Nervousness.  Inability to tolerate heat.  Unexplained weight loss.  Diarrhea.  Change in the texture of hair or skin.  Heart skipping beats or making extra beats.  Rapid heart rate.  Loss of menstruation.  Shaky hands.  Fatigue.  Restlessness.  Increased appetite.  Sleep problems.  Enlarged thyroid gland or nodules. DIAGNOSIS  Diagnosis of hyperthyroidism may include:  Medical history and physical exam.  Blood tests.  Ultrasound tests. TREATMENT Treatment may include:  Medicines to control your thyroid.  Surgery to remove your thyroid.  Radiation therapy. HOME CARE INSTRUCTIONS   Take medicines only as directed by  your health care provider.  Do not use any tobacco products, including cigarettes, chewing tobacco, or electronic cigarettes. If you need help quitting, ask your health care provider.  Do not exercise or do physical activity until your health care provider approves.  Keep all follow-up appointments as directed by your health care provider. This is important. SEEK MEDICAL CARE IF:  Your symptoms do not get better with treatment.  You have fever.  You are taking thyroid replacement medicine and you:  Have depression.  Feel mentally and physically slow.  Have weight gain. SEEK IMMEDIATE MEDICAL CARE IF:   You have decreased alertness or a change in your awareness.  You have abdominal pain.  You feel dizzy.  You have a rapid heartbeat.  You have an irregular heartbeat.   This information is not intended to replace advice given to you by your health care provider. Make sure you discuss any questions you have with your health care provider.   Document Released: 08/01/2005 Document Revised: 08/22/2014 Document Reviewed: 12/17/2013 Elsevier Interactive Patient Education 2016 Elsevier Inc.   Radioiodine (I-131) Therapy for Hyperthyroidism Radioiodine (I-131) therapy for hyperthyroidism is a procedure used to treat an overactive thyroid (hyperthyroidism). The patient swallows I-131, which is a radioactive form of iodine. The I-131 destroys thyroid cells. The thyroid is a gland in the neck. It makes thyroid hormones, which control how cells throughout the body use energy. Hyperthyroidism is usually caused by Grave's disease or by growths within the thyroid (nodules). Symptoms may include:  Nervousness.  Irritability.  Problems with sleep.  Tiredness.  Fast heart rate.  Shaky hands.  Sweating.  Heat sensitivity.  Unintended weight loss.  Brittle hair.  Enlarged thyroid gland.  Menstrual changes.  Frequent stools. LET YOUR CAREGIVER KNOW ABOUT:   All  allergies.  All medications that you are taking, including over-the-counter and prescription drugs, dietary supplements, vitamins, or herbal preparations.  Any previous complications from this or other procedures.  Smoking history.  Possibility of pregnancy.  History of bleeding problems.  Any other health problems. RISKS AND COMPLICATIONS  Risks of the procedure include:  Slight pain in the area of the thyroid gland. BEFORE THE PROCEDURE  If you are a woman, you may be asked to have pregnancy testing before the procedure.  If you have been taking thyroid medicines, you will usually be asked to stop them three days before your procedure.  You will usually be asked to stop eating and drinking at midnight the day of your procedure.  You will need to plan for someone to drive you home after the procedure. PROCEDURE  You will be asked to swallow the radioactive iodine in either pill or liquid form. It can take 1-3 months for the treatment to work. The treatment is most effective at about 3-6 months after the dose of iodine has been given. In most people, a single dose of radioactive iodine resolves their hyperthyroidism, but a few people will require a second dose.  AFTER THE PROCEDURE  Because you will be giving off tiny amounts of radiation for several days, there are some special precautions you will be asked to follow for about 2-4 days after the procedure:  Avoid being around babies or pregnant women.  Do not use public bathrooms.  Flush twice after using the toilet.  Take a bath or shower every day.  Practice good hand washing.  Drink fluids normally.  Use disposable utensils, or clean your utensils separately from those of others.  Sleep alone.  Do not engage in intimate contact.  Wash your sheets, towels, and clothes each day, by themselves.  Do not make food for other people. Other precautions include:  Stopping breastfeeding.  Do not attempt to become  pregnant for at least a year after you have had the procedure.  When traveling, bring a letter of explanation from your caregiver for three months. Radioactivity may trip detectors in airports or other places. Because the point of the procedure is to destroy your thyroid gland, you will need to take thyroid hormone by mouth for the rest of your life. HOME CARE INSTRUCTIONS   Ask your caregiver when you should resume or start thyroid medications.  Take all medications exactly as directed.  Follow any prescribed diet.  Follow instructions regarding both rest and physical activity. SEEK IMMEDIATE MEDICAL CARE IF:  You have a dry mouth.  You have a sore throat.  You have neck pain.  You have a tight sensation in the throat.  You have nausea and vomiting.  You are fatigued.  You have flushing.  You have bowel changes (either diarrhea or constipation).   This information is not intended to replace advice given to you by your health care provider. Make sure you discuss any questions you have with your health care provider.   Document Released: 12/18/2008 Document Revised: 10/24/2011 Document Reviewed: 11/26/2014 Elsevier Interactive Patient Education [employer]2016 Elsevier Inc.

## 2016-06-21 ENCOUNTER — Ambulatory Visit (HOSPITAL_COMMUNITY)
Admission: RE | Admit: 2016-06-21 | Discharge: 2016-06-21 | Disposition: A | Discharge status: Home / Self Care | Payer: Medicare Other | Source: Ambulatory Visit | Attending: Cardiology | Admitting: Cardiology

## 2016-06-21 VITALS — BP 132/82 | HR 101 | Wt 160.1 lb

## 2016-06-21 DIAGNOSIS — R0600 Dyspnea, unspecified: Secondary | ICD-10-CM

## 2016-06-21 DIAGNOSIS — Z87891 Personal history of nicotine dependence: Secondary | ICD-10-CM | POA: Insufficient documentation

## 2016-06-21 DIAGNOSIS — I428 Other cardiomyopathies: Secondary | ICD-10-CM | POA: Insufficient documentation

## 2016-06-21 DIAGNOSIS — N183 Chronic kidney disease, stage 3 (moderate): Secondary | ICD-10-CM | POA: Insufficient documentation

## 2016-06-21 DIAGNOSIS — E0590 Thyrotoxicosis, unspecified without thyrotoxic crisis or storm: Secondary | ICD-10-CM | POA: Diagnosis not present

## 2016-06-21 DIAGNOSIS — Z79899 Other long term (current) drug therapy: Secondary | ICD-10-CM | POA: Diagnosis not present

## 2016-06-21 DIAGNOSIS — I2510 Atherosclerotic heart disease of native coronary artery without angina pectoris: Secondary | ICD-10-CM | POA: Diagnosis not present

## 2016-06-21 DIAGNOSIS — Z888 Allergy status to other drugs, medicaments and biological substances status: Secondary | ICD-10-CM | POA: Diagnosis not present

## 2016-06-21 DIAGNOSIS — I5022 Chronic systolic (congestive) heart failure: Secondary | ICD-10-CM | POA: Insufficient documentation

## 2016-06-21 DIAGNOSIS — G40909 Epilepsy, unspecified, not intractable, without status epilepticus: Secondary | ICD-10-CM | POA: Insufficient documentation

## 2016-06-21 DIAGNOSIS — Z95810 Presence of automatic (implantable) cardiac defibrillator: Secondary | ICD-10-CM | POA: Insufficient documentation

## 2016-06-21 DIAGNOSIS — I480 Paroxysmal atrial fibrillation: Secondary | ICD-10-CM | POA: Diagnosis not present

## 2016-06-21 DIAGNOSIS — I472 Ventricular tachycardia, unspecified: Secondary | ICD-10-CM

## 2016-06-21 DIAGNOSIS — J449 Chronic obstructive pulmonary disease, unspecified: Secondary | ICD-10-CM | POA: Diagnosis not present

## 2016-06-21 DIAGNOSIS — I4819 Other persistent atrial fibrillation: Secondary | ICD-10-CM

## 2016-06-21 DIAGNOSIS — R05 Cough: Secondary | ICD-10-CM | POA: Diagnosis not present

## 2016-06-21 DIAGNOSIS — I481 Persistent atrial fibrillation: Secondary | ICD-10-CM

## 2016-06-21 DIAGNOSIS — Z86711 Personal history of pulmonary embolism: Secondary | ICD-10-CM | POA: Insufficient documentation

## 2016-06-21 DIAGNOSIS — Z8673 Personal history of transient ischemic attack (TIA), and cerebral infarction without residual deficits: Secondary | ICD-10-CM | POA: Insufficient documentation

## 2016-06-21 DIAGNOSIS — Z7901 Long term (current) use of anticoagulants: Secondary | ICD-10-CM | POA: Diagnosis not present

## 2016-06-21 DIAGNOSIS — R0683 Snoring: Secondary | ICD-10-CM

## 2016-06-21 LAB — CBC
HCT: 38.3 % — ABNORMAL LOW (ref 39.0–52.0)
HEMOGLOBIN: 12.7 g/dL — AB (ref 13.0–17.0)
MCH: 31.1 pg (ref 26.0–34.0)
MCHC: 33.2 g/dL (ref 30.0–36.0)
MCV: 93.9 fL (ref 78.0–100.0)
Platelets: 300 10*3/uL (ref 150–400)
RBC: 4.08 MIL/uL — AB (ref 4.22–5.81)
RDW: 14.7 % (ref 11.5–15.5)
WBC: 5.5 10*3/uL (ref 4.0–10.5)

## 2016-06-21 LAB — BASIC METABOLIC PANEL
ANION GAP: 11 (ref 5–15)
BUN: 16 mg/dL (ref 6–20)
CO2: 21 mmol/L — ABNORMAL LOW (ref 22–32)
Calcium: 10 mg/dL (ref 8.9–10.3)
Chloride: 104 mmol/L (ref 101–111)
Creatinine, Ser: 1.19 mg/dL (ref 0.61–1.24)
GLUCOSE: 105 mg/dL — AB (ref 65–99)
POTASSIUM: 4 mmol/L (ref 3.5–5.1)
SODIUM: 136 mmol/L (ref 135–145)

## 2016-06-21 LAB — BRAIN NATRIURETIC PEPTIDE: B NATRIURETIC PEPTIDE 5: 760.3 pg/mL — AB (ref 0.0–100.0)

## 2016-06-21 LAB — DIGOXIN LEVEL: DIGOXIN LVL: 1.1 ng/mL (ref 0.8–2.0)

## 2016-06-21 MED ORDER — LOSARTAN POTASSIUM 25 MG PO TABS: 12.5000 mg | ORAL_TABLET | Freq: Every day | ORAL | Status: DC

## 2016-06-21 MED ORDER — TORSEMIDE 20 MG PO TABS: 20.0000 mg | ORAL_TABLET | Freq: Every day | ORAL | Status: DC

## 2016-06-21 NOTE — Patient Instructions (Signed)
Labs today (will call for abnormal results, otherwise no news is good news)  EKG today  Chest Xray today  Start Losartan 12.5mg  (1/2 Tab) once Daily at bedtime  Increase Torsemide 20mg  (1 Tab) once Daily  Referral for Sleep Study, they will contact you for appointment  Follow up in 1 week with Dr. Shirlee Latch

## 2016-06-21 NOTE — Progress Notes (Signed)
    ReDS Vest - 06/21/16 1100      ReDS Vest   MR  No   Estimated volume prior to reading Med   Fitting Posture Standing   Height Marker Tall   Ruler Value 15.5   Center Strip Aligned   ReDS Value 41

## 2016-06-22 LAB — THYROID STIMULATING IMMUNOGLOBULIN: TSI: 240 % baseline — ABNORMAL HIGH (ref <140)

## 2016-06-22 NOTE — Progress Notes (Signed)
Patient ID: Johnathan Morrison, male   DOB: Mar 16, 1946, 70 y.o.   MRN: [id number]    Advanced Heart Failure Clinic Note    Primary Care: [facility] Primary Cardiologist: Dr. Diona BrownerMcDowell Primary HF: Dr. Shirlee LatchMcLean   HPI: Johnathan Morrison is a 70 y.o. 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  He was admitted to [facility]/23/16 with SOB and ?syncopal episode. ICD interrogation showed 2 shocks. Transferred to [facility] for HF and EP evaluation.  Echo with LVEF 15-20% range. Started on milrinone and weaned off prior to d/c. He was over-diuresed with CVP around 2 and received gentle IVF and diuretics adjusted.   He presented to ED on 03/03/16 with lightheadedness and received IV fluid and diuretics were held.  He had very close follow up scheduled but again reported to ED on 03/05/16 with chest pain, dyspnea, and poor appetite. Had repeat RHC that admission with marginal cardiac output.  He was being considered as a VAD but there were a concerns about lack of social support.  He was also noted to be hyperthyroid in the setting of amiodarone use. Amiodarone was stopped and he is now on mexiletine.  On 06/13/16, He went to the ER with another ICD discharge.  VT was noted.  EP was consulted and increased his mexiletine.   Today he returns for HF followup.  He is living at a SNF.  At last appointment, torsemide was stopped.  Weight is stable today, but he has been more short of breath for about a month.  Dyspnea walking short distance.  Sleeps on side with occasional PND. Sleeping poorly overall, has roommate who wakes him up a lot.  No chest pain.  He snores and has daytime sleepiness. He has seen endocrinology for treatment of hyperthyroidism and is now on methimazole.   Corevue: Recent fluid  accumulation with lower impedance, no atrial fibrillation, no VT.   ReDs vest measurement 41  Labs (8/17): digoxin 0.7 Labs (10/17): K 3.8, creatinine 1.2   PMH: 1. Chronic systolic CHF: Nonischemic cardiomyopathy.   - St Jude CRT-D => nonresponder, BiV pacing off.  - LHC/RHC (02/25/16): RA 9, PA 36/12, PCWP mean 13, CI 1.89.  Nonobstructive CAD.  - RHC (03/08/16): RA 2, PA 31/12, PCWP mean 7, CI 2.15 - Echo (7/17): Moderate LV dilation with EF 15-20%, severe diffuse hypokinesis, moderate to severe biatrial enlargement, moderately dilated RV with decreased systolic function, moderate MR.  2. Atrial fibrillation: Paroxysmal.  3. PE: 3/16, had IVC filter that was removed in 10/16.  4. CVA: 2/16.  5. CKD stage III.  6. H/o VT 7. Seizure disorder 8. COPD: Prior smoker.  9. H/o SAH 10. CAD: Nonobstructive on 7/17 cath.  11. 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   Current Outpatient Prescriptions  Medication Sig Dispense Refill  . acetaminophen (TYLENOL) 325 MG tablet Take 2 tablets (650 mg total) by mouth every 4 (four) hours as needed for headache or mild pain.    Marland Kitchen. albuterol (PROVENTIL HFA;VENTOLIN HFA) 108 (90 Base) MCG/ACT inhaler Inhale 2 puffs into the lungs every 6 (six) hours as needed for wheezing or shortness of breath. 1 Inhaler 0  . atorvastatin (LIPITOR) 40 MG tablet Take 40 mg by mouth every morning.   0  . digoxin (LANOXIN) 0.125 MG tablet Take 125 mcg by  mouth daily.  6  . guaiFENesin (MUCINEX) 600 MG 12 hr tablet Take 2 tablets (1,200 mg total) by mouth 2 (two) times daily. 120 tablet 6  . levETIRAcetam (KEPPRA) 500 MG tablet Take 500 mg by mouth daily.     Marland Kitchen LORazepam (ATIVAN) 0.5 MG tablet Take 0.5 mg by mouth every 8 (eight) hours as needed for anxiety. Reported on 02/23/2016    . meclizine (ANTIVERT) 25 MG tablet Take 25 mg by mouth 2 (two) times daily as needed for dizziness.    . Melatonin 3 MG TABS Take 3 mg by mouth at bedtime.    . methimazole (TAPAZOLE)  10 MG tablet Take 10 mg by mouth every 8 (eight) hours.    . mexiletine (MEXITIL) 200 MG capsule Take 1 capsule (200 mg total) by mouth 3 (three) times daily. 90 capsule 6  . nitroGLYCERIN (NITROSTAT) 0.4 MG SL tablet Place 1 tablet (0.4 mg total) under the tongue every 5 (five) minutes as needed for chest pain. 30 tablet 0  . omeprazole (PRILOSEC) 20 MG capsule Take 20 mg by mouth daily.    . polyethylene glycol (MIRALAX / GLYCOLAX) packet Take 17 g by mouth daily. 14 each 0  . Rivaroxaban (XARELTO) 15 MG TABS tablet Take 15 mg by mouth daily with supper.    . senna (SENOKOT) 8.6 MG tablet Take 1 tablet by mouth every 4 (four) hours as needed for constipation.    . sertraline (ZOLOFT) 50 MG tablet Take 50 mg by mouth daily.    Marland Kitchen spironolactone (ALDACTONE) 25 MG tablet Take 25 mg by mouth daily.    . temazepam (RESTORIL) 7.5 MG capsule Take 7.5 mg by mouth at bedtime as needed for sleep.    Marland Kitchen torsemide (DEMADEX) 20 MG tablet Take 1 tablet (20 mg total) by mouth daily.    . traMADol (ULTRAM) 50 MG tablet Take 50 mg by mouth every 6 (six) hours as needed for moderate pain or severe pain.    Marland Kitchen losartan (COZAAR) 25 MG tablet Take 0.5 tablets (12.5 mg total) by mouth at bedtime.     No current facility-administered medications for this encounter.     Allergies  Allergen Reactions  . 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 Marital status: Single    Spouse name: N/A  . Number of children: N/A  . Years of education: N/A   Occupational History  . Not on file.   Social History Main Topics  . Smoking status: Former Games developer  . Smokeless tobacco: Never Used     Comment: smoked 1.5 ppd x 30 years, quit in 2012)  . Alcohol use No  . Drug use: No  . Sexual activity: Not on file   Other Topics Concern  . Not on file   Social History Narrative  . No narrative on file      Family History  Problem  Relation Age of Onset  . Cardiomyopathy    . Sudden death      Vitals:   2016-07-14 1026  BP: 132/82  Pulse: (!) 101  SpO2: 99%  Weight: 160 lb 1.9 oz (72.6 kg)   Wt Readings from Last 3 Encounters:  07/14/16 160 lb 1.9 oz (72.6 kg)  06/20/16 158 lb (71.7 kg)  06/13/16 163 lb (73.9 kg)    Physical Exam: General: NAD.  HEENT: normal Neck: supple. JVP not clearly  elevated. Carotids 2+ bilat; no bruits. No  thyromegaly or nodule noted. Cor: PMI nondisplaced. Mildly tachy, RRR. No rubs, or murmurs. No S3. Lungs: Occasional rhonchi.  Abdomen: soft, NT, ND, no HSM. No bruits or masses. +BS  Extremities: no cyanosis, clubbing, rash. No peripheral edema Neuro: alert & orientedx3, cranial nerves grossly intact.    ASSESSMENT & PLAN:  1.Chronic Systolic Heart Failure: Nonischemic cardiomyopathy.  St Jude CRT-D device, not set to BiV pace b/c ineffective (baseline QRS complex is now narrow).  Nonischemic cardiomyopathy, EF 15-20% on 7/17 echo. NYHA class III symptoms.  Not markedly volume overloaded by exam, but volume overload is suggested by both Corevue and ReDs vest.  - Increase torsemide from 10 mg daily to 20 mg daily.  BMET today and in 10 days.  - Continue digoxin, check level. - Continue spironolactone.  - Add losartan 12.5 mg daily, move to evening dosing.  - Would not use Bidil, unable to tolerate Imdur due to headache.    - For now he is not a candidate for advanced therapies given lack of social support and poor insight into his disease process.   2. VT: Last shock 06/13/16. Off amiodarone due to hyperthyroidism.  Mexiletine was increased, no further events.  3. Atrial fibrillation: Paroxysmal.  He is on Xarelto 15 daily (renally dosed).  4. Hyperthyroidism: Seeing endocrine, he is on methimazole and we've stopped amiodarone.    5. H/O PE: On coumadin.  6. Suspect OSA: I will arrange for a sleep study.  7. Pulmonary: Rhonchi on lung exam, productive cough.  I will arrange for  CXR today to rule out PNA.   Followup with me in 1 week.   [name] 06/22/2016

## 2016-06-23 ENCOUNTER — Other Ambulatory Visit: Payer: Self-pay | Admitting: Internal Medicine

## 2016-06-23 DIAGNOSIS — E0590 Thyrotoxicosis, unspecified without thyrotoxic crisis or storm: Secondary | ICD-10-CM

## 2016-06-23 MED ORDER — METHIMAZOLE 10 MG PO TABS: 10.0000 mg | ORAL_TABLET | Freq: Two times a day (BID) | ORAL | 2 refills | Status: AC

## 2016-06-27 ENCOUNTER — Telehealth (HOSPITAL_COMMUNITY): Payer: Self-pay | Admitting: *Deleted

## 2016-06-27 ENCOUNTER — Encounter (HOSPITAL_COMMUNITY): Payer: Self-pay | Admitting: *Deleted

## 2016-06-27 NOTE — Telephone Encounter (Signed)
-----   Message from Laurey Morale, MD sent at 06/21/2016 10:36 PM EST ----- Elevated digoxin level, repeat digoxin level as trough at next appt (draw blood before am digoxin is taken).

## 2016-06-27 NOTE — Telephone Encounter (Signed)
Notes Recorded by Modesta Messing, CMA on 06/27/2016 at 1:37 PM EST Letter mailed. ------  Notes Recorded by Modesta Messing, CMA on 06/23/2016 at 3:33 PM EST Unable to reach patient. No answer. If unable to reach patient tomorrow will mail letter.  ------  Notes Recorded by Modesta Messing, CMA on 06/23/2016 at 10:53 AM EST No answer at patients extension could not leave a voice message. Will try again later today. ------  Notes Recorded by Laurey Morale, MD on 06/21/2016 at 10:36 PM EST Elevated digoxin level, repeat digoxin level as trough at next appt (draw blood before am digoxin is taken).    Ref Range & Units 6d ago 2wk ago 38mo ago   Sodium 135 - 145 mmol/L 136  136  137    Potassium 3.5 - 5.1 mmol/L 4.0  3.8  4.3    Chloride 101 - 111 mmol/L 104  106  103    CO2 22 - 32 mmol/L 21   24  23     Glucose, Bld 65 - 99 mg/dL 106   269   98    BUN 6 - 20 mg/dL 16  21   10     Creatinine, Ser 0.61 - 1.24 mg/dL 4.85  4.62  7.03    Calcium 8.9 - 10.3 mg/dL 50.0  9.4  93.8     GFR calc non Af Amer >60 mL/min >60  60   >60    GFR calc Af Amer >60 mL/min >60  >60CM  >60CM

## 2016-06-28 ENCOUNTER — Encounter (HOSPITAL_COMMUNITY): Payer: Self-pay | Admitting: *Deleted

## 2016-06-28 ENCOUNTER — Ambulatory Visit (HOSPITAL_COMMUNITY)
Admission: RE | Admit: 2016-06-28 | Discharge: 2016-06-28 | Disposition: A | Discharge status: Home / Self Care | Payer: Medicare Other | Source: Ambulatory Visit | Attending: Cardiology | Admitting: Cardiology

## 2016-06-28 VITALS — BP 100/70 | Wt 163.8 lb

## 2016-06-28 DIAGNOSIS — I480 Paroxysmal atrial fibrillation: Secondary | ICD-10-CM | POA: Insufficient documentation

## 2016-06-28 DIAGNOSIS — Z888 Allergy status to other drugs, medicaments and biological substances status: Secondary | ICD-10-CM | POA: Insufficient documentation

## 2016-06-28 DIAGNOSIS — Z79899 Other long term (current) drug therapy: Secondary | ICD-10-CM | POA: Insufficient documentation

## 2016-06-28 DIAGNOSIS — N183 Chronic kidney disease, stage 3 (moderate): Secondary | ICD-10-CM | POA: Insufficient documentation

## 2016-06-28 DIAGNOSIS — Z87891 Personal history of nicotine dependence: Secondary | ICD-10-CM

## 2016-06-28 DIAGNOSIS — I2510 Atherosclerotic heart disease of native coronary artery without angina pectoris: Secondary | ICD-10-CM | POA: Insufficient documentation

## 2016-06-28 DIAGNOSIS — Z7901 Long term (current) use of anticoagulants: Secondary | ICD-10-CM

## 2016-06-28 DIAGNOSIS — I5022 Chronic systolic (congestive) heart failure: Secondary | ICD-10-CM | POA: Insufficient documentation

## 2016-06-28 DIAGNOSIS — I472 Ventricular tachycardia, unspecified: Secondary | ICD-10-CM

## 2016-06-28 DIAGNOSIS — J449 Chronic obstructive pulmonary disease, unspecified: Secondary | ICD-10-CM

## 2016-06-28 DIAGNOSIS — E0590 Thyrotoxicosis, unspecified without thyrotoxic crisis or storm: Secondary | ICD-10-CM

## 2016-06-28 DIAGNOSIS — I428 Other cardiomyopathies: Secondary | ICD-10-CM | POA: Insufficient documentation

## 2016-06-28 DIAGNOSIS — I4819 Other persistent atrial fibrillation: Secondary | ICD-10-CM

## 2016-06-28 DIAGNOSIS — F329 Major depressive disorder, single episode, unspecified: Secondary | ICD-10-CM

## 2016-06-28 DIAGNOSIS — G40909 Epilepsy, unspecified, not intractable, without status epilepticus: Secondary | ICD-10-CM

## 2016-06-28 DIAGNOSIS — Z95810 Presence of automatic (implantable) cardiac defibrillator: Secondary | ICD-10-CM | POA: Insufficient documentation

## 2016-06-28 DIAGNOSIS — Z86711 Personal history of pulmonary embolism: Secondary | ICD-10-CM

## 2016-06-28 LAB — BASIC METABOLIC PANEL
Anion gap: 10 (ref 5–15)
BUN: 14 mg/dL (ref 6–20)
CO2: 25 mmol/L (ref 22–32)
Calcium: 9.8 mg/dL (ref 8.9–10.3)
Chloride: 100 mmol/L — ABNORMAL LOW (ref 101–111)
Creatinine, Ser: 1.24 mg/dL (ref 0.61–1.24)
GFR calc Af Amer: >60 mL/min (ref >60)
GFR calc non Af Amer: 58 mL/min — ABNORMAL LOW (ref >60)
Glucose, Bld: 99 mg/dL (ref 65–99)
Potassium: 4.3 mmol/L (ref 3.5–5.1)
Sodium: 135 mmol/L (ref 135–145)

## 2016-06-28 LAB — DIGOXIN LEVEL: Digoxin Level: 1.1 ng/mL (ref 0.8–2.0)

## 2016-06-28 MED ORDER — TORSEMIDE 20 MG PO TABS: 40.0000 mg | ORAL_TABLET | Freq: Every day | ORAL | 3 refills | Status: DC

## 2016-06-28 NOTE — Patient Instructions (Signed)
Labs today (will call for abnormal results, otherwise no news is good news)  Increase Torsemide to 40 mg (2 Tabs) once Daily  Right Heart Cath has been scheduled for this Friday, November 17th @ 12:30.  Please see instructions.  Follow up in 3 weeks

## 2016-06-28 NOTE — Progress Notes (Signed)
CSW received referral to assist patient with information about changing SNF. Patient reports that he would like to move to a different SNF facility. Patient states that he has been exploring another facility in [place] and is considering a move to the new facility. Patient states frustration with length of time he has been in the facility awaiting medicaid approval. CSW provided supportive listening and encouraged patient to follow up with facility personnel regarding options. Patient verbalized understanding of follow up. CSW available if needed. Lasandra Beech, LCSW [phone]

## 2016-06-29 ENCOUNTER — Telehealth (HOSPITAL_COMMUNITY): Payer: Self-pay | Admitting: *Deleted

## 2016-06-29 MED ORDER — DIGOXIN 125 MCG PO TABS: 0.0625 mg | ORAL_TABLET | Freq: Every day | ORAL | 6 refills | Status: AC

## 2016-06-29 NOTE — Progress Notes (Signed)
Patient ID: Johnathan Morrison, male   DOB: [birth year], 70 y.o.   MRN: [id number]    Advanced Heart Failure Clinic Note    Primary Care: [facility] Primary Cardiologist: Dr. Diona Browner Primary HF: Dr. Shirlee Latch   HPI: Johnathan Morrison is a 70 y.o. 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  He was admitted to [facility] 02/23/16 with SOB and ?syncopal episode. ICD interrogation showed 2 shocks. Transferred to [facility] for HF and EP evaluation.  Echo with LVEF 15-20% range. Started on milrinone and weaned off prior to d/c. He was over-diuresed with CVP around 2 and received gentle IVF and diuretics adjusted.   He presented to ED on 03/03/16 with lightheadedness and received IV fluid and diuretics were held.  He had very close follow up scheduled but again reported to ED on 03/05/16 with chest pain, dyspnea, and poor appetite. Had repeat RHC that admission with marginal cardiac output.  He was being considered as a VAD but there were a concerns about lack of social support.  He was also noted to be hyperthyroid in the setting of amiodarone use. Amiodarone was stopped and he is now on mexiletine.  On 06/13/16, He went to the ER with another ICD discharge.  VT was noted.  EP was consulted and increased his mexiletine.   Today he returns for HF followup.  He is living at a SNF.  At last appointment, he was short of breath with Corevue suggesting some fluid accumulation.  Torsemide was restarted at 20 mg daily.  Today, he is still short of breath.  This varies, can by dyspnea after walking 15 feet or can go 75-100 yards, depends on the day.  He has had chronic chest pain worse with deep inspiration for months now.  No orthopnea/PND.  No palpitations.  No lightheadedness.  His mood is depressed.   He really does not like being in SNF.  Weight is up 3 lbs.   Labs (8/17): digoxin 0.7 Labs (10/17): K 3.8, creatinine 1.2  Labs (11/17): K 4, creatinine 1.19, BNP 760, digoxin 1.1, hgb 12.7  PMH: 1. Chronic systolic CHF: Nonischemic cardiomyopathy.   - St Jude CRT-D => nonresponder, BiV pacing off.  - LHC/RHC (02/25/16): RA 9, PA 36/12, PCWP mean 13, CI 1.89.  Nonobstructive CAD.  - RHC (03/08/16): RA 2, PA 31/12, PCWP mean 7, CI 2.15 - Echo (7/17): Moderate LV dilation with EF 15-20%, severe diffuse hypokinesis, moderate to severe biatrial enlargement, moderately dilated RV with decreased systolic function, moderate MR.  2. Atrial fibrillation: Paroxysmal.  3. PE: 3/16, had IVC filter that was removed in 10/16.  4. CVA: 2/16.  5. CKD stage III.  6. H/o VT 7. Seizure disorder 8. COPD: Prior smoker.  9. H/o SAH 10. CAD: Nonobstructive on 7/17 cath.  11. 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   Current Outpatient Prescriptions  Medication Sig Dispense Refill  . acetaminophen (TYLENOL) 325 MG tablet Take 2 tablets (650 mg total) by mouth every 4 (four) hours as needed for headache or mild pain.    Marland Kitchen albuterol (PROVENTIL HFA;VENTOLIN HFA) 108 (90 Base) MCG/ACT inhaler Inhale 2 puffs into the lungs every 6 (six) hours as needed for wheezing or shortness of breath. 1 Inhaler 0  . atorvastatin (LIPITOR) 40 MG tablet Take 40 mg by mouth every  morning.   0  . guaiFENesin (MUCINEX) 600 MG 12 hr tablet Take 2 tablets (1,200 mg total) by mouth 2 (two) times daily. 120 tablet 6  . levETIRAcetam (KEPPRA) 500 MG tablet Take 500 mg by mouth daily.     Marland Kitchen. LORazepam (ATIVAN) 0.5 MG tablet Take 0.5 mg by mouth every 8 (eight) hours as needed for anxiety. Reported on 02/23/2016    . losartan (COZAAR) 25 MG tablet Take 0.5 tablets (12.5 mg total) by mouth at bedtime.    . meclizine (ANTIVERT) 25 MG tablet Take 25 mg by mouth 2 (two) times daily as needed for dizziness.    . Melatonin 3 MG TABS Take  3 mg by mouth at bedtime.    . methimazole (TAPAZOLE) 10 MG tablet Take 1 tablet (10 mg total) by mouth 2 (two) times daily. (Patient taking differently: Take 10 mg by mouth 3 (three) times daily. ) 60 tablet 2  . mexiletine (MEXITIL) 200 MG capsule Take 1 capsule (200 mg total) by mouth 3 (three) times daily. 90 capsule 6  . nitroGLYCERIN (NITROSTAT) 0.4 MG SL tablet Place 1 tablet (0.4 mg total) under the tongue every 5 (five) minutes as needed for chest pain. 30 tablet 0  . omeprazole (PRILOSEC) 20 MG capsule Take 20 mg by mouth daily.    . polyethylene glycol (MIRALAX / GLYCOLAX) packet Take 17 g by mouth daily. 14 each 0  . Rivaroxaban (XARELTO) 15 MG TABS tablet Take 15 mg by mouth daily with supper.    . sertraline (ZOLOFT) 50 MG tablet Take 50 mg by mouth daily.    Marland Kitchen. spironolactone (ALDACTONE) 25 MG tablet Take 25 mg by mouth daily.    . temazepam (RESTORIL) 7.5 MG capsule Take 7.5 mg by mouth at bedtime as needed for sleep.    Marland Kitchen. torsemide (DEMADEX) 20 MG tablet Take 2 tablets (40 mg total) by mouth daily. 60 tablet 3  . traMADol (ULTRAM) 50 MG tablet Take 50 mg by mouth every 6 (six) hours as needed for moderate pain or severe pain.    Marland Kitchen. digoxin (LANOXIN) 0.125 MG tablet Take 0.5 tablets (0.0625 mg total) by mouth daily. 15 tablet 6   No current facility-administered medications for this encounter.     Allergies  Allergen Reactions  . 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 Marital status: Single    Spouse name: N/A  . Number of children: N/A  . Years of education: N/A   Occupational History  . Not on file.   Social History Main Topics  . Smoking status: Former Games developermoker  . Smokeless tobacco: Never Used     Comment: smoked 1.5 ppd x 30 years, quit in 2012)  . Alcohol use No  . Drug use: No  . Sexual activity: Not on file   Other Topics Concern  . Not on file   Social History  Narrative  . No narrative on file      Family History  Problem Relation Age of Onset  . Cardiomyopathy    . Sudden death      Vitals:   06/28/16 1124  BP: 100/70  Weight: 163 lb 12.8 oz (74.3 kg)   Wt Readings from Last 3 Encounters:  06/28/16 163 lb 12.8 oz (74.3 kg)  06/21/16 160 lb 1.9 oz (72.6 kg)  06/20/16 158 lb (71.7 kg)    Physical Exam:  General: NAD.  HEENT: normal Neck: supple. JVP 8 cm. Carotids 2+ bilat; no bruits. No  thyromegaly or nodule noted. Cor: PMI nondisplaced. Regular S1S2. No rubs, or murmurs. No S3. Lungs: Occasional rhonchi.  Abdomen: soft, NT, ND, no HSM. No bruits or masses. +BS  Extremities: no cyanosis, clubbing, rash. No peripheral edema Neuro: alert & orientedx3, cranial nerves grossly intact.    ASSESSMENT & PLAN:  1.Chronic Systolic Heart Failure: Nonischemic cardiomyopathy.  St Jude CRT-D device, not set to BiV pace b/c ineffective (baseline QRS complex is now narrow).  Nonischemic cardiomyopathy, EF 15-20% on 7/17 echo. NYHA class III symptoms.  I think he still has some volume overload though exam is difficult.  Weight is up despite increasing torsemide at last appointment.  I am also concerned for possible low output.  - Increase torsemide to 40 mg daily today with BMET today and in 10 days.   - Continue digoxin, check level today => was mildly elevated when last checked. - Continue spironolactone and losartan.  - Would not use Bidil, unable to tolerate Imdur due to headache.    - I am going to do an RHC this week to confirm filling pressures and assess for low output. We discussed risks/benefits of the procedure and he agrees to go forward with it.  - For now he is not a candidate for advanced therapies given lack of social support => living in SNF currently.  Son lives near but cannot help much due to work schedule. 2. VT: Last shock 06/13/16. Off amiodarone due to hyperthyroidism.  Mexiletine was increased, no further events.  3. Atrial  fibrillation: Paroxysmal.  He is on Xarelto 15 daily (renally dosed). Hold Xarelto a day before RHC.  4. Hyperthyroidism: Seeing endocrine, he is on methimazole and we've stopped amiodarone.    5. H/O PE: On coumadin.  6. Suspect OSA: Sleep study on Sunday.  7. Depression: Depressed mood, very down about living at [facility].   [name] 06/29/2016

## 2016-06-29 NOTE — Telephone Encounter (Signed)
-----   Message from Laurey Morale, MD sent at 06/28/2016  5:14 PM EST ----- Decrease digoxin to 0.0625 daily

## 2016-06-29 NOTE — Telephone Encounter (Signed)
Pt aware of results 

## 2016-07-01 ENCOUNTER — Ambulatory Visit (HOSPITAL_COMMUNITY): Payer: Medicare Other

## 2016-07-01 ENCOUNTER — Inpatient Hospital Stay (HOSPITAL_COMMUNITY)
Admission: RE | Admit: 2016-07-01 | Discharge: 2016-07-04 | DRG: 286 | Disposition: A | Discharge status: Skilled Nursing Facility | Payer: Medicare Other | Source: Ambulatory Visit | Attending: Cardiology | Admitting: Cardiology

## 2016-07-01 ENCOUNTER — Encounter (HOSPITAL_COMMUNITY): Payer: Self-pay | Admitting: *Deleted

## 2016-07-01 ENCOUNTER — Encounter (HOSPITAL_COMMUNITY)
Admission: RE | Disposition: A | Discharge status: Skilled Nursing Facility | Payer: Self-pay | Source: Ambulatory Visit | Attending: Cardiology

## 2016-07-01 DIAGNOSIS — I5023 Acute on chronic systolic (congestive) heart failure: Secondary | ICD-10-CM | POA: Diagnosis present

## 2016-07-01 DIAGNOSIS — I429 Cardiomyopathy, unspecified: Secondary | ICD-10-CM

## 2016-07-01 DIAGNOSIS — I4729 Other ventricular tachycardia: Secondary | ICD-10-CM

## 2016-07-01 DIAGNOSIS — I130 Hypertensive heart and chronic kidney disease with heart failure and stage 1 through stage 4 chronic kidney disease, or unspecified chronic kidney disease: Principal | ICD-10-CM | POA: Diagnosis present

## 2016-07-01 DIAGNOSIS — R0602 Shortness of breath: Secondary | ICD-10-CM | POA: Diagnosis present

## 2016-07-01 DIAGNOSIS — E0580 Other thyrotoxicosis without thyrotoxic crisis or storm: Secondary | ICD-10-CM | POA: Diagnosis present

## 2016-07-01 DIAGNOSIS — Z7901 Long term (current) use of anticoagulants: Secondary | ICD-10-CM | POA: Diagnosis not present

## 2016-07-01 DIAGNOSIS — I472 Ventricular tachycardia: Secondary | ICD-10-CM

## 2016-07-01 DIAGNOSIS — I428 Other cardiomyopathies: Secondary | ICD-10-CM | POA: Diagnosis present

## 2016-07-01 DIAGNOSIS — N183 Chronic kidney disease, stage 3 unspecified: Secondary | ICD-10-CM | POA: Diagnosis present

## 2016-07-01 DIAGNOSIS — Z87891 Personal history of nicotine dependence: Secondary | ICD-10-CM

## 2016-07-01 DIAGNOSIS — Z8673 Personal history of transient ischemic attack (TIA), and cerebral infarction without residual deficits: Secondary | ICD-10-CM

## 2016-07-01 DIAGNOSIS — I493 Ventricular premature depolarization: Secondary | ICD-10-CM | POA: Diagnosis present

## 2016-07-01 DIAGNOSIS — R569 Unspecified convulsions: Secondary | ICD-10-CM | POA: Diagnosis present

## 2016-07-01 DIAGNOSIS — Z515 Encounter for palliative care: Secondary | ICD-10-CM | POA: Diagnosis not present

## 2016-07-01 DIAGNOSIS — Z95810 Presence of automatic (implantable) cardiac defibrillator: Secondary | ICD-10-CM

## 2016-07-01 DIAGNOSIS — I272 Pulmonary hypertension, unspecified: Secondary | ICD-10-CM | POA: Diagnosis present

## 2016-07-01 DIAGNOSIS — I509 Heart failure, unspecified: Secondary | ICD-10-CM

## 2016-07-01 DIAGNOSIS — I2510 Atherosclerotic heart disease of native coronary artery without angina pectoris: Secondary | ICD-10-CM | POA: Diagnosis present

## 2016-07-01 DIAGNOSIS — Z789 Other specified health status: Secondary | ICD-10-CM

## 2016-07-01 DIAGNOSIS — Z86711 Personal history of pulmonary embolism: Secondary | ICD-10-CM

## 2016-07-01 DIAGNOSIS — I4891 Unspecified atrial fibrillation: Secondary | ICD-10-CM | POA: Diagnosis present

## 2016-07-01 DIAGNOSIS — F329 Major depressive disorder, single episode, unspecified: Secondary | ICD-10-CM | POA: Diagnosis present

## 2016-07-01 DIAGNOSIS — J449 Chronic obstructive pulmonary disease, unspecified: Secondary | ICD-10-CM | POA: Diagnosis present

## 2016-07-01 DIAGNOSIS — E876 Hypokalemia: Secondary | ICD-10-CM | POA: Diagnosis not present

## 2016-07-01 DIAGNOSIS — T462X5S Adverse effect of other antidysrhythmic drugs, sequela: Secondary | ICD-10-CM | POA: Diagnosis not present

## 2016-07-01 DIAGNOSIS — I480 Paroxysmal atrial fibrillation: Secondary | ICD-10-CM | POA: Diagnosis present

## 2016-07-01 DIAGNOSIS — Z8249 Family history of ischemic heart disease and other diseases of the circulatory system: Secondary | ICD-10-CM

## 2016-07-01 DIAGNOSIS — Z888 Allergy status to other drugs, medicaments and biological substances status: Secondary | ICD-10-CM | POA: Diagnosis not present

## 2016-07-01 DIAGNOSIS — Z95828 Presence of other vascular implants and grafts: Secondary | ICD-10-CM

## 2016-07-01 HISTORY — PX: CARDIAC CATHETERIZATION: SHX172

## 2016-07-01 LAB — BASIC METABOLIC PANEL
Anion gap: 9 (ref 5–15)
BUN: 13 mg/dL (ref 6–20)
CALCIUM: 9.3 mg/dL (ref 8.9–10.3)
CHLORIDE: 100 mmol/L — AB (ref 101–111)
CO2: 27 mmol/L (ref 22–32)
CREATININE: 1.09 mg/dL (ref 0.61–1.24)
Glucose, Bld: 108 mg/dL — ABNORMAL HIGH (ref 65–99)
Potassium: 3.5 mmol/L (ref 3.5–5.1)
SODIUM: 136 mmol/L (ref 135–145)

## 2016-07-01 LAB — POCT I-STAT 3, VENOUS BLOOD GAS (G3P V)
ACID-BASE EXCESS: 1 mmol/L (ref 0.0–2.0)
BICARBONATE: 26.1 mmol/L (ref 20.0–28.0)
Bicarbonate: 24.6 mmol/L (ref 20.0–28.0)
O2 SAT: 50 %
O2 Saturation: 49 %
PO2 VEN: 27 mmHg — AB (ref 32.0–45.0)
PO2 VEN: 27 mmHg — AB (ref 32.0–45.0)
TCO2: 26 mmol/L (ref 0–100)
TCO2: 27 mmol/L (ref 0–100)
pCO2, Ven: 39.8 mmHg — ABNORMAL LOW (ref 44.0–60.0)
pCO2, Ven: 41.7 mmHg — ABNORMAL LOW (ref 44.0–60.0)
pH, Ven: 7.399 (ref 7.250–7.430)
pH, Ven: 7.404 (ref 7.250–7.430)

## 2016-07-01 LAB — PROTIME-INR
INR: 1.18
Prothrombin Time: 15.1 seconds (ref 11.4–15.2)

## 2016-07-01 LAB — CBC
HCT: 36.6 % — ABNORMAL LOW (ref 39.0–52.0)
Hemoglobin: 11.9 g/dL — ABNORMAL LOW (ref 13.0–17.0)
MCH: 31.1 pg (ref 26.0–34.0)
MCHC: 32.5 g/dL (ref 30.0–36.0)
MCV: 95.6 fL (ref 78.0–100.0)
PLATELETS: 264 10*3/uL (ref 150–400)
RBC: 3.83 MIL/uL — AB (ref 4.22–5.81)
RDW: 14.5 % (ref 11.5–15.5)
WBC: 4.6 10*3/uL (ref 4.0–10.5)

## 2016-07-01 LAB — MRSA PCR SCREENING: MRSA BY PCR: NEGATIVE

## 2016-07-01 SURGERY — RIGHT HEART CATH
Anesthesia: LOCAL

## 2016-07-01 MED ORDER — POLYETHYLENE GLYCOL 3350 17 G PO PACK
17.0000 g | PACK | Freq: Every day | ORAL | Status: DC
  Filled 2016-07-01 (×3): qty 1

## 2016-07-01 MED ORDER — SODIUM CHLORIDE 0.9% FLUSH
3.0000 mL | Freq: Two times a day (BID) | INTRAVENOUS | Status: DC
  Administered 2016-07-02 – 2016-07-04 (×3): 3 mL via INTRAVENOUS

## 2016-07-01 MED ORDER — SODIUM CHLORIDE 0.9% FLUSH
10.0000 mL | Freq: Two times a day (BID) | INTRAVENOUS | Status: DC
  Administered 2016-07-01 – 2016-07-04 (×6): 10 mL

## 2016-07-01 MED ORDER — FENTANYL CITRATE (PF) 100 MCG/2ML IJ SOLN
INTRAMUSCULAR | Status: AC
  Filled 2016-07-01: qty 2

## 2016-07-01 MED ORDER — SODIUM CHLORIDE 0.9% FLUSH: 3.0000 mL | INTRAVENOUS | Status: DC | PRN

## 2016-07-01 MED ORDER — SODIUM CHLORIDE 0.9 % IV SOLN: 250.0000 mL | INTRAVENOUS | Status: DC | PRN

## 2016-07-01 MED ORDER — LIDOCAINE HCL (PF) 1 % IJ SOLN
INTRAMUSCULAR | Status: AC
  Filled 2016-07-01: qty 30

## 2016-07-01 MED ORDER — ACETAMINOPHEN 325 MG PO TABS: 650.0000 mg | ORAL_TABLET | ORAL | Status: DC | PRN

## 2016-07-01 MED ORDER — SODIUM CHLORIDE 0.9% FLUSH
3.0000 mL | INTRAVENOUS | Status: DC | PRN
  Administered 2016-07-04: 3 mL via INTRAVENOUS
  Filled 2016-07-01: qty 3

## 2016-07-01 MED ORDER — LEVETIRACETAM 500 MG PO TABS
500.0000 mg | ORAL_TABLET | Freq: Every day | ORAL | Status: DC
  Administered 2016-07-01 – 2016-07-04 (×4): 500 mg via ORAL
  Filled 2016-07-01 (×4): qty 1

## 2016-07-01 MED ORDER — FENTANYL CITRATE (PF) 100 MCG/2ML IJ SOLN
INTRAMUSCULAR | Status: DC | PRN
  Administered 2016-07-01: 25 ug via INTRAVENOUS

## 2016-07-01 MED ORDER — MIDAZOLAM HCL 2 MG/2ML IJ SOLN
INTRAMUSCULAR | Status: DC | PRN
  Administered 2016-07-01: 1 mg via INTRAVENOUS

## 2016-07-01 MED ORDER — ONDANSETRON HCL 4 MG/2ML IJ SOLN: 4.0000 mg | Freq: Four times a day (QID) | INTRAMUSCULAR | Status: DC | PRN

## 2016-07-01 MED ORDER — HEPARIN (PORCINE) IN NACL 2-0.9 UNIT/ML-% IJ SOLN
INTRAMUSCULAR | Status: AC
  Filled 2016-07-01: qty 500

## 2016-07-01 MED ORDER — SPIRONOLACTONE 25 MG PO TABS
25.0000 mg | ORAL_TABLET | Freq: Every day | ORAL | Status: DC
  Administered 2016-07-01 – 2016-07-04 (×4): 25 mg via ORAL
  Filled 2016-07-01 (×4): qty 1

## 2016-07-01 MED ORDER — ACETAMINOPHEN 325 MG PO TABS
650.0000 mg | ORAL_TABLET | ORAL | Status: DC | PRN
  Administered 2016-07-01 – 2016-07-03 (×3): 650 mg via ORAL
  Filled 2016-07-01 (×3): qty 2

## 2016-07-01 MED ORDER — LIDOCAINE HCL (PF) 1 % IJ SOLN
INTRAMUSCULAR | Status: DC | PRN
  Administered 2016-07-01: 5 mL

## 2016-07-01 MED ORDER — MECLIZINE HCL 12.5 MG PO TABS: 25.0000 mg | ORAL_TABLET | Freq: Two times a day (BID) | ORAL | Status: DC | PRN

## 2016-07-01 MED ORDER — ATORVASTATIN CALCIUM 40 MG PO TABS
40.0000 mg | ORAL_TABLET | Freq: Every day | ORAL | Status: DC
Start: 2016-07-02 — End: 2016-07-04
  Administered 2016-07-02 – 2016-07-04 (×3): 40 mg via ORAL
  Filled 2016-07-01 (×3): qty 1

## 2016-07-01 MED ORDER — MIDAZOLAM HCL 2 MG/2ML IJ SOLN
INTRAMUSCULAR | Status: AC
  Filled 2016-07-01: qty 2

## 2016-07-01 MED ORDER — LORAZEPAM 0.5 MG PO TABS: 0.5000 mg | ORAL_TABLET | Freq: Three times a day (TID) | ORAL | Status: DC | PRN

## 2016-07-01 MED ORDER — MEXILETINE HCL 200 MG PO CAPS
200.0000 mg | ORAL_CAPSULE | Freq: Three times a day (TID) | ORAL | Status: DC
  Administered 2016-07-01 – 2016-07-04 (×9): 200 mg via ORAL
  Filled 2016-07-01 (×9): qty 1

## 2016-07-01 MED ORDER — TRAMADOL HCL 50 MG PO TABS: 50.0000 mg | ORAL_TABLET | Freq: Four times a day (QID) | ORAL | Status: DC | PRN

## 2016-07-01 MED ORDER — MELATONIN 3 MG PO TABS
3.0000 mg | ORAL_TABLET | Freq: Every day | ORAL | Status: DC
  Administered 2016-07-01 – 2016-07-03 (×3): 3 mg via ORAL
  Filled 2016-07-01 (×4): qty 1

## 2016-07-01 MED ORDER — HEPARIN (PORCINE) IN NACL 2-0.9 UNIT/ML-% IJ SOLN
INTRAMUSCULAR | Status: DC | PRN
  Administered 2016-07-01: 500 mL

## 2016-07-01 MED ORDER — MILRINONE LACTATE IN DEXTROSE 20-5 MG/100ML-% IV SOLN
0.2500 ug/kg/min | INTRAVENOUS | Status: DC
  Administered 2016-07-01 – 2016-07-04 (×5): 0.25 ug/kg/min via INTRAVENOUS
  Filled 2016-07-01 (×6): qty 100

## 2016-07-01 MED ORDER — METHIMAZOLE 10 MG PO TABS
10.0000 mg | ORAL_TABLET | Freq: Two times a day (BID) | ORAL | Status: DC
  Administered 2016-07-01 – 2016-07-04 (×6): 10 mg via ORAL
  Filled 2016-07-01 (×7): qty 1

## 2016-07-01 MED ORDER — DIGOXIN 125 MCG PO TABS
0.0625 mg | ORAL_TABLET | Freq: Every day | ORAL | Status: DC
  Administered 2016-07-02 – 2016-07-04 (×3): 0.0625 mg via ORAL
  Filled 2016-07-01 (×3): qty 1

## 2016-07-01 MED ORDER — TORSEMIDE 20 MG PO TABS
40.0000 mg | ORAL_TABLET | Freq: Every day | ORAL | Status: DC
  Administered 2016-07-01 – 2016-07-02 (×2): 40 mg via ORAL
  Filled 2016-07-01 (×2): qty 2

## 2016-07-01 MED ORDER — TEMAZEPAM 7.5 MG PO CAPS: 7.5000 mg | ORAL_CAPSULE | Freq: Every evening | ORAL | Status: DC | PRN

## 2016-07-01 MED ORDER — SERTRALINE HCL 50 MG PO TABS
50.0000 mg | ORAL_TABLET | Freq: Every day | ORAL | Status: DC
  Administered 2016-07-02 – 2016-07-04 (×3): 50 mg via ORAL
  Filled 2016-07-01 (×3): qty 1

## 2016-07-01 MED ORDER — GUAIFENESIN ER 600 MG PO TB12
1200.0000 mg | ORAL_TABLET | Freq: Two times a day (BID) | ORAL | Status: DC
  Administered 2016-07-01 – 2016-07-04 (×6): 1200 mg via ORAL
  Filled 2016-07-01 (×6): qty 2

## 2016-07-01 MED ORDER — SODIUM CHLORIDE 0.9% FLUSH
10.0000 mL | INTRAVENOUS | Status: DC | PRN
  Administered 2016-07-01: 20 mL
  Filled 2016-07-01: qty 40

## 2016-07-01 MED ORDER — SODIUM CHLORIDE 0.9 % IV SOLN
INTRAVENOUS | Status: DC
  Administered 2016-07-01: 12:00:00 via INTRAVENOUS

## 2016-07-01 MED ORDER — ALBUTEROL SULFATE (2.5 MG/3ML) 0.083% IN NEBU: 3.0000 mL | INHALATION_SOLUTION | Freq: Four times a day (QID) | RESPIRATORY_TRACT | Status: DC | PRN

## 2016-07-01 MED ORDER — RIVAROXABAN 15 MG PO TABS
15.0000 mg | ORAL_TABLET | Freq: Every day | ORAL | Status: DC
  Administered 2016-07-01: 15 mg via ORAL
  Filled 2016-07-01: qty 1

## 2016-07-01 MED ORDER — PANTOPRAZOLE SODIUM 40 MG PO TBEC
40.0000 mg | DELAYED_RELEASE_TABLET | Freq: Every day | ORAL | Status: DC
  Administered 2016-07-04: 40 mg via ORAL
  Filled 2016-07-01 (×3): qty 1

## 2016-07-01 SURGICAL SUPPLY — 7 items
CATH BALLN WEDGE 5F 110CM (CATHETERS) ×2 IMPLANT
PACK CARDIAC CATHETERIZATION (CUSTOM PROCEDURE TRAY) ×2 IMPLANT
SHEATH FAST CATH BRACH 5F 5CM (SHEATH) ×6 IMPLANT
TRANSDUCER W/STOPCOCK (MISCELLANEOUS) ×2 IMPLANT
TUBING ART PRESS 72  MALE/FEM (TUBING) ×1
TUBING ART PRESS 72 MALE/FEM (TUBING) ×1 IMPLANT
WIRE EMERALD 3MM-J .025X260CM (WIRE) ×2 IMPLANT

## 2016-07-01 NOTE — Interval H&P Note (Signed)
History and Physical Interval Note:  07/01/2016 1:19 PM  Johnathan Morrison  has presented today for surgery, with the diagnosis of pulmonary hypertension  The various methods of treatment have been discussed with the patient and family. After consideration of risks, benefits and other options for treatment, the patient has consented to  Procedure(s): Right Heart Cath (N/A) as a surgical intervention .  The patient's history has been reviewed, patient examined, no change in status, stable for surgery.  I have reviewed the patient's chart and labs.  Questions were answered to the patient's satisfaction.     Stevi Hollinshead [employer]

## 2016-07-01 NOTE — Progress Notes (Addendum)
Patient ID: Johnathan Morrison, male   DOB: 24-Jan-1946, 70 y.o.   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   He has been living in a SNF with limited social support.  He has been markedly fatigued recently with significant dyspnea as well.  I was concerned for low output HF and brought him in today for RHC.  This showed relatively optimized filling pressures but cardiac index was 1.5.    I am going to admit him and place PICC line.  Will start milrinone 0.25.  The milrinone will be for palliation.  He has very limited social support and lives in a SNF currently.  I do not think that he will be an LVAD candidate.  Will ask palliative care to talk with him while he is here.  If he feels better symptomatically on milrinone, would be reasonable to continue at home.    We will continue his mexiletine, methimazole, spironolactone, and torsemide.  Would hold losartan for now. Resume Xarelto tonight. Can stay on digoxin.   Awaiting labs.   [name] 07/01/2016 5:23 PM

## 2016-07-01 NOTE — Progress Notes (Signed)
Asked by Dr. Shirlee Latch to followup on CBC and BMET. Checked with lab and nurse, lab has not resulted at this time as patient will need to have PICC line adjusted. Discussed with overnight fellow who will followup on the lab.  Ramond Dial PA Pager: [phone]

## 2016-07-01 NOTE — Progress Notes (Signed)
Peripherally Inserted Central Catheter/Midline Placement  The IV Nurse has discussed with the patient and/or persons authorized to consent for the patient, the purpose of this procedure and the potential benefits and risks involved with this procedure.  The benefits include less needle sticks, lab draws from the catheter, and the patient may be discharged home with the catheter. Risks include, but not limited to, infection, bleeding, blood clot (thrombus formation), and puncture of an artery; nerve damage and irregular heartbeat and possibility to perform a PICC exchange if needed/ordered by physician.  Alternatives to this procedure were also discussed.  Bard Power PICC patient education guide, fact sheet on infection prevention and patient information card has been provided to patient /or left at bedside.    PICC/Midline Placement Documentation        Johnathan Morrison, [name] 07/01/2016, 6:10 PM

## 2016-07-01 NOTE — H&P (View-Only) (Signed)
Patient ID: Johnathan Morrison, male   DOB: [birth year], 70 y.o.   MRN: [id number]    Advanced Heart Failure Clinic Note    Primary Care: [facility] Primary Cardiologist: Dr. Diona Browner Primary HF: Dr. Shirlee Latch   HPI: Johnathan Morrison is a 70 y.o. 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  He was admitted to [facility] 02/23/16 with SOB and ?syncopal episode. ICD interrogation showed 2 shocks. Transferred to [facility] for HF and EP evaluation.  Echo with LVEF 15-20% range. Started on milrinone and weaned off prior to d/c. He was over-diuresed with CVP around 2 and received gentle IVF and diuretics adjusted.   He presented to ED on 03/03/16 with lightheadedness and received IV fluid and diuretics were held.  He had very close follow up scheduled but again reported to ED on 03/05/16 with chest pain, dyspnea, and poor appetite. Had repeat RHC that admission with marginal cardiac output.  He was being considered as a VAD but there were a concerns about lack of social support.  He was also noted to be hyperthyroid in the setting of amiodarone use. Amiodarone was stopped and he is now on mexiletine.  On 06/13/16, He went to the ER with another ICD discharge.  VT was noted.  EP was consulted and increased his mexiletine.   Today he returns for HF followup.  He is living at a SNF.  At last appointment, he was short of breath with Corevue suggesting some fluid accumulation.  Torsemide was restarted at 20 mg daily.  Today, he is still short of breath.  This varies, can by dyspnea after walking 15 feet or can go 75-100 yards, depends on the day.  He has had chronic chest pain worse with deep inspiration for months now.  No orthopnea/PND.  No palpitations.  No lightheadedness.  His mood is depressed.   He really does not like being in SNF.  Weight is up 3 lbs.   Labs (8/17): digoxin 0.7 Labs (10/17): K 3.8, creatinine 1.2  Labs (11/17): K 4, creatinine 1.19, BNP 760, digoxin 1.1, hgb 12.7  PMH: 1. Chronic systolic CHF: Nonischemic cardiomyopathy.   - St Jude CRT-D => nonresponder, BiV pacing off.  - LHC/RHC (02/25/16): RA 9, PA 36/12, PCWP mean 13, CI 1.89.  Nonobstructive CAD.  - RHC (03/08/16): RA 2, PA 31/12, PCWP mean 7, CI 2.15 - Echo (7/17): Moderate LV dilation with EF 15-20%, severe diffuse hypokinesis, moderate to severe biatrial enlargement, moderately dilated RV with decreased systolic function, moderate MR.  2. Atrial fibrillation: Paroxysmal.  3. PE: 3/16, had IVC filter that was removed in 10/16.  4. CVA: 2/16.  5. CKD stage III.  6. H/o VT 7. Seizure disorder 8. COPD: Prior smoker.  9. H/o SAH 10. CAD: Nonobstructive on 7/17 cath.  11. 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   Current Outpatient Prescriptions  Medication Sig Dispense Refill  . acetaminophen (TYLENOL) 325 MG tablet Take 2 tablets (650 mg total) by mouth every 4 (four) hours as needed for headache or mild pain.    Marland Kitchen albuterol (PROVENTIL HFA;VENTOLIN HFA) 108 (90 Base) MCG/ACT inhaler Inhale 2 puffs into the lungs every 6 (six) hours as needed for wheezing or shortness of breath. 1 Inhaler 0  . atorvastatin (LIPITOR) 40 MG tablet Take 40 mg by mouth every  morning.   0  . guaiFENesin (MUCINEX) 600 MG 12 hr tablet Take 2 tablets (1,200 mg total) by mouth 2 (two) times daily. 120 tablet 6  . levETIRAcetam (KEPPRA) 500 MG tablet Take 500 mg by mouth daily.     Marland Kitchen. LORazepam (ATIVAN) 0.5 MG tablet Take 0.5 mg by mouth every 8 (eight) hours as needed for anxiety. Reported on 02/23/2016    . losartan (COZAAR) 25 MG tablet Take 0.5 tablets (12.5 mg total) by mouth at bedtime.    . meclizine (ANTIVERT) 25 MG tablet Take 25 mg by mouth 2 (two) times daily as needed for dizziness.    . Melatonin 3 MG TABS Take  3 mg by mouth at bedtime.    . methimazole (TAPAZOLE) 10 MG tablet Take 1 tablet (10 mg total) by mouth 2 (two) times daily. (Patient taking differently: Take 10 mg by mouth 3 (three) times daily. ) 60 tablet 2  . mexiletine (MEXITIL) 200 MG capsule Take 1 capsule (200 mg total) by mouth 3 (three) times daily. 90 capsule 6  . nitroGLYCERIN (NITROSTAT) 0.4 MG SL tablet Place 1 tablet (0.4 mg total) under the tongue every 5 (five) minutes as needed for chest pain. 30 tablet 0  . omeprazole (PRILOSEC) 20 MG capsule Take 20 mg by mouth daily.    . polyethylene glycol (MIRALAX / GLYCOLAX) packet Take 17 g by mouth daily. 14 each 0  . Rivaroxaban (XARELTO) 15 MG TABS tablet Take 15 mg by mouth daily with supper.    . sertraline (ZOLOFT) 50 MG tablet Take 50 mg by mouth daily.    Marland Kitchen. spironolactone (ALDACTONE) 25 MG tablet Take 25 mg by mouth daily.    . temazepam (RESTORIL) 7.5 MG capsule Take 7.5 mg by mouth at bedtime as needed for sleep.    Marland Kitchen. torsemide (DEMADEX) 20 MG tablet Take 2 tablets (40 mg total) by mouth daily. 60 tablet 3  . traMADol (ULTRAM) 50 MG tablet Take 50 mg by mouth every 6 (six) hours as needed for moderate pain or severe pain.    Marland Kitchen. digoxin (LANOXIN) 0.125 MG tablet Take 0.5 tablets (0.0625 mg total) by mouth daily. 15 tablet 6   No current facility-administered medications for this encounter.     Allergies  Allergen Reactions  . 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 Marital status: Single    Spouse name: N/A  . Number of children: N/A  . Years of education: N/A   Occupational History  . Not on file.   Social History Main Topics  . Smoking status: Former Games developermoker  . Smokeless tobacco: Never Used     Comment: smoked 1.5 ppd x 30 years, quit in 2012)  . Alcohol use No  . Drug use: No  . Sexual activity: Not on file   Other Topics Concern  . Not on file   Social History  Narrative  . No narrative on file      Family History  Problem Relation Age of Onset  . Cardiomyopathy    . Sudden death      Vitals:   06/28/16 1124  BP: 100/70  Weight: 163 lb 12.8 oz (74.3 kg)   Wt Readings from Last 3 Encounters:  06/28/16 163 lb 12.8 oz (74.3 kg)  06/21/16 160 lb 1.9 oz (72.6 kg)  06/20/16 158 lb (71.7 kg)    Physical Exam:  General: NAD.  HEENT: normal Neck: supple. JVP 8 cm. Carotids 2+ bilat; no bruits. No  thyromegaly or nodule noted. Cor: PMI nondisplaced. Regular S1S2. No rubs, or murmurs. No S3. Lungs: Occasional rhonchi.  Abdomen: soft, NT, ND, no HSM. No bruits or masses. +BS  Extremities: no cyanosis, clubbing, rash. No peripheral edema Neuro: alert & orientedx3, cranial nerves grossly intact.    ASSESSMENT & PLAN:  1.Chronic Systolic Heart Failure: Nonischemic cardiomyopathy.  St Jude CRT-D device, not set to BiV pace b/c ineffective (baseline QRS complex is now narrow).  Nonischemic cardiomyopathy, EF 15-20% on 7/17 echo. NYHA class III symptoms.  I think he still has some volume overload though exam is difficult.  Weight is up despite increasing torsemide at last appointment.  I am also concerned for possible low output.  - Increase torsemide to 40 mg daily today with BMET today and in 10 days.   - Continue digoxin, check level today => was mildly elevated when last checked. - Continue spironolactone and losartan.  - Would not use Bidil, unable to tolerate Imdur due to headache.    - I am going to do an RHC this week to confirm filling pressures and assess for low output. We discussed risks/benefits of the procedure and he agrees to go forward with it.  - For now he is not a candidate for advanced therapies given lack of social support => living in SNF currently.  Son lives near but cannot help much due to work schedule. 2. VT: Last shock 06/13/16. Off amiodarone due to hyperthyroidism.  Mexiletine was increased, no further events.  3. Atrial  fibrillation: Paroxysmal.  He is on Xarelto 15 daily (renally dosed). Hold Xarelto a day before RHC.  4. Hyperthyroidism: Seeing endocrine, he is on methimazole and we've stopped amiodarone.    5. H/O PE: On coumadin.  6. Suspect OSA: Sleep study on Sunday.  7. Depression: Depressed mood, very down about living at [facility].   [name] 06/29/2016

## 2016-07-02 DIAGNOSIS — Z515 Encounter for palliative care: Secondary | ICD-10-CM

## 2016-07-02 DIAGNOSIS — I5023 Acute on chronic systolic (congestive) heart failure: Secondary | ICD-10-CM

## 2016-07-02 DIAGNOSIS — I480 Paroxysmal atrial fibrillation: Secondary | ICD-10-CM

## 2016-07-02 LAB — BASIC METABOLIC PANEL
Anion gap: 9 (ref 5–15)
BUN: 14 mg/dL (ref 6–20)
CALCIUM: 9.1 mg/dL (ref 8.9–10.3)
CO2: 28 mmol/L (ref 22–32)
CREATININE: 1.18 mg/dL (ref 0.61–1.24)
Chloride: 100 mmol/L — ABNORMAL LOW (ref 101–111)
Glucose, Bld: 132 mg/dL — ABNORMAL HIGH (ref 65–99)
Potassium: 3.5 mmol/L (ref 3.5–5.1)
SODIUM: 137 mmol/L (ref 135–145)

## 2016-07-02 LAB — CBC
HEMATOCRIT: 36.2 % — AB (ref 39.0–52.0)
HEMOGLOBIN: 11.8 g/dL — AB (ref 13.0–17.0)
MCH: 30.9 pg (ref 26.0–34.0)
MCHC: 32.6 g/dL (ref 30.0–36.0)
MCV: 94.8 fL (ref 78.0–100.0)
Platelets: 254 10*3/uL (ref 150–400)
RBC: 3.82 MIL/uL — ABNORMAL LOW (ref 4.22–5.81)
RDW: 13.9 % (ref 11.5–15.5)
WBC: 4.9 10*3/uL (ref 4.0–10.5)

## 2016-07-02 LAB — COOXEMETRY PANEL
CARBOXYHEMOGLOBIN: 1.7 % — AB (ref 0.5–1.5)
Methemoglobin: 0.6 % (ref 0.0–1.5)
O2 SAT: 57.4 %
Total hemoglobin: 12.2 g/dL (ref 12.0–16.0)

## 2016-07-02 MED ORDER — RIVAROXABAN 20 MG PO TABS
20.0000 mg | ORAL_TABLET | Freq: Every day | ORAL | Status: DC
  Administered 2016-07-02 – 2016-07-04 (×3): 20 mg via ORAL
  Filled 2016-07-02 (×3): qty 1

## 2016-07-02 MED ORDER — POTASSIUM CHLORIDE CRYS ER 20 MEQ PO TBCR
40.0000 meq | EXTENDED_RELEASE_TABLET | Freq: Every day | ORAL | Status: DC
  Administered 2016-07-02 – 2016-07-04 (×3): 40 meq via ORAL
  Filled 2016-07-02 (×3): qty 2

## 2016-07-02 NOTE — Consult Note (Signed)
Consultation Note Date: 07/02/2016   Patient Name: Johnathan Morrison  DOB: Nov 02, 1945  MRN: [id number]  Age / Sex: 70 y.o., male  PCP: [facility] Referring Physician: Laurey Moralealton S McLean, MD  Reason for Consultation: Establishing goals of care  HPI/Patient Profile: 70 y.o. male     admitted on 07/01/2016   Clinical Assessment and Goals of Care:  70 year old gentleman known to the palliative service, seen in a previous hospitalization, life limiting illness of acute on chronic systolic heart failure nonischemic cardiomyopathy. Patient has a St. Jude defibrillator device, ejection fraction 15-20 percent based on echocardiogram from July 2017. Patient [place]. He was seen by palliative services the last time. Patient knows he is not a candidate for advanced therapies and also knows that his heart is getting weaker and his overall health is declining. He believes that his ICD must have fired off at the nursing facility. Currently, he denies chest pain or dyspnea. Palliative services reconsulted for goals of care discussions. Patient is resting in bed. He is aware of palliative services and remembers meeting another provider for the palliative medicine team and a previous hospitalization. Ideally he would like to be home by himself in an apartment but this is not possible. He has a son and a daughter Who reportedly lives nearby. There is no family present at the bedside this morning. Patient denies any chest pain or dyspnea. Extensive discussions about the patient's overall condition. See recommendations below. Thank you for the consult.  [facility]  elects his son Johnathan Morrison to be his HCPOA agent.   SUMMARY OF RECOMMENDATIONS    DNI,   Goals of care discussions: Discussed with patient regarding his acute/chronic systolic CHF, his ICD, use of milrinone, also extensively  discussed about Hospice services. Patient wishes to continue to think about his wishes and options. He would like to be by himself in an apartment in [place], [place] but realizes he needs to be in a SNF for now.  Recommend SNF with palliative services following, with close follow up, monitor trajectory and continue hospice discussions.  No acute symptoms currently. Agree with milrinone.   Thank you for the consult.   Code Status/Advance Care Planning:  Limited code    Symptom Management:    continue current treatment   Palliative Prophylaxis:   Delirium Protocol  Psycho-social/Spiritual:   Desire for further Chaplaincy support:no  Additional Recommendations: Education on Hospice  Prognosis:   < 6 months  Discharge Planning: Skilled Nursing Facility for rehab with Palliative care service follow-up      Primary Diagnoses: Present on Admission: . Acute on chronic systolic heart failure, NYHA class 4 (HCC)   I have reviewed the medical record, interviewed the patient and family, and examined the patient. The following aspects are pertinent.  Past Medical History:  Diagnosis Date  . Atrial fibrillation (HCC)   . Cardiomyopathy (HCC)   . Chronic renal disease, stage III   . Chronic systolic heart failure (HCC)   .  COPD (chronic obstructive pulmonary disease) (HCC)   . 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 Diastolic dysfunction    Grade 3  . Essential hypertension   . PE (pulmonary embolism) March 2016  . Pulmonary HTN    Rt heart cath June 2016  . SAH (subarachnoid hemorrhage) (HCC) Dec 2015  . Seizures [facility])    Social History   Social History  . Marital status: Single    Spouse name: N/A  . Number of children: N/A  . Years of education: N/A   Social History Main Topics  . Smoking status: Former Games developer  . Smokeless tobacco: Never Used     Comment: smoked 1.5 ppd x 30 years, quit in 2012)  . Alcohol use No  . Drug use: No  . Sexual activity: Not  Asked   Other Topics Concern  . None   Social History Narrative  . None   Family History  Problem Relation Age of Onset  . Cardiomyopathy    . Sudden death     Scheduled Meds: . atorvastatin  40 mg Oral q1800  . digoxin  0.0625 mg Oral Daily  . guaiFENesin  1,200 mg Oral BID  . levETIRAcetam  500 mg Oral Daily  . Melatonin  3 mg Oral QHS  . methimazole  10 mg Oral BID  . mexiletine  200 mg Oral TID  . pantoprazole  40 mg Oral Daily  . polyethylene glycol  17 g Oral Daily  . potassium chloride  40 mEq Oral Daily  . rivaroxaban  20 mg Oral Q supper  . sertraline  50 mg Oral Daily  . sodium chloride flush  10-40 mL Intracatheter Q12H  . sodium chloride flush  3 mL Intravenous Q12H  . sodium chloride flush  3 mL Intravenous Q12H  . spironolactone  25 mg Oral Daily  . torsemide  40 mg Oral Daily   Continuous Infusions: . sodium chloride 10 mL/hr at 07/02/16 0500  . milrinone 0.25 mcg/kg/min (07/02/16 0500)   PRN Meds:.sodium chloride, sodium chloride, acetaminophen, albuterol, LORazepam, meclizine, ondansetron (ZOFRAN) IV, sodium chloride flush, sodium chloride flush, sodium chloride flush, temazepam, traMADol Medications Prior to Admission:  Prior to Admission medications   Medication Sig Start Date End Date Taking? Authorizing Provider  acetaminophen (TYLENOL) 325 MG tablet Take 2 tablets (650 mg total) by mouth every 4 (four) hours as needed for headache or mild pain. 03/10/16  Yes Renae Fickle, MD  albuterol (PROVENTIL HFA;VENTOLIN HFA) 108 (90 Base) MCG/ACT inhaler Inhale 2 puffs into the lungs every 6 (six) hours as needed for wheezing or shortness of breath. 02/12/16  Yes [name], MD  atorvastatin (LIPITOR) 40 MG tablet Take 40 mg by mouth every morning.  01/27/16  Yes Historical Provider, MD  digoxin (LANOXIN) 0.125 MG tablet Take 0.5 tablets (0.0625 mg total) by mouth daily. 06/29/16  Yes Johnathan Morale, MD  guaiFENesin (MUCINEX) 600 MG 12 hr tablet Take 2  tablets (1,200 mg total) by mouth 2 (two) times daily. 03/25/16  Yes Dolores Patty, MD  levETIRAcetam (KEPPRA) 500 MG tablet Take 500 mg by mouth daily.    Yes Historical Provider, MD  LORazepam (ATIVAN) 0.5 MG tablet Take 0.5 mg by mouth every 8 (eight) hours as needed for anxiety. Reported on 02/23/2016   Yes Historical Provider, MD  losartan (COZAAR) 25 MG tablet Take 0.5 tablets (12.5 mg total) by mouth at bedtime. 06/21/16  Yes Johnathan Morale, MD  meclizine Lezlie Octave)  25 MG tablet Take 25 mg by mouth 2 (two) times daily as needed for dizziness.   Yes Historical Provider, MD  Melatonin 3 MG TABS Take 3 mg by mouth at bedtime.   Yes Historical Provider, MD  methimazole (TAPAZOLE) 10 MG tablet Take 1 tablet (10 mg total) by mouth 2 (two) times daily. Patient taking differently: Take 10 mg by mouth 3 (three) times daily.  06/23/16  Yes Carlus Pavlov, MD  mexiletine (MEXITIL) 200 MG capsule Take 1 capsule (200 mg total) by mouth 3 (three) times daily. 06/13/16  Yes Antoine Poche, MD  nitroGLYCERIN (NITROSTAT) 0.4 MG SL tablet Place 1 tablet (0.4 mg total) under the tongue every 5 (five) minutes as needed for chest pain. 02/03/16  Yes Devoria Albe, MD  omeprazole (PRILOSEC) 20 MG capsule Take 20 mg by mouth daily.   Yes Historical Provider, MD  polyethylene glycol (MIRALAX / GLYCOLAX) packet Take 17 g by mouth daily. 03/10/16  Yes Renae Fickle, MD  Rivaroxaban (XARELTO) 15 MG TABS tablet Take 15 mg by mouth daily with supper.   Yes Historical Provider, MD  sertraline (ZOLOFT) 50 MG tablet Take 50 mg by mouth daily.   Yes Historical Provider, MD  spironolactone (ALDACTONE) 25 MG tablet Take 25 mg by mouth daily.   Yes Historical Provider, MD  temazepam (RESTORIL) 7.5 MG capsule Take 7.5 mg by mouth at bedtime as needed for sleep.   Yes Historical Provider, MD  torsemide (DEMADEX) 20 MG tablet Take 2 tablets (40 mg total) by mouth daily. 06/28/16  Yes Johnathan Morale, MD  traMADol (ULTRAM) 50 MG  tablet Take 50 mg by mouth every 6 (six) hours as needed for moderate pain or severe pain.   Yes Historical Provider, MD   Allergies  Allergen Reactions  . 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   Review of Systems Denies dyspnea.   Physical Exam General: Weak appearing.  NAD.  HEENT: normal S1 S2 Lungs: clear Abdomen: soft, NT,   Extremities:  No peripheral edema. RUE PICC Neuro: alert & orientedx3, cranial nerves grossly intact.  Vital Signs: BP 97/63   Pulse 78   Temp 97.7 F (36.5 C) (Oral)   Resp (!) 21   Ht 6\' 3"  (1.905 m)   Wt 71.5 kg (157 lb 10.1 oz)   SpO2 97%   BMI 19.70 kg/m  Pain Assessment: No/denies pain POSS *See Group Information*: 1-Acceptable,Awake and alert Pain Score: 0-No pain   SpO2: SpO2: 97 % O2 Device:SpO2: 97 % O2 Flow Rate: .   IO: Intake/output summary:  Intake/Output Summary (Last 24 hours) at 07/02/16 1148 Last data filed at 07/02/16 0500  Gross per 24 hour  Intake           218.63 ml  Output             2400 ml  Net         -2181.37 ml    LBM: Last BM Date: 07/01/16 Baseline Weight: Weight: 73.9 kg (163 lb) Most recent weight: Weight: 71.5 kg (157 lb 10.1 oz)     Palliative Assessment/Data:   Flowsheet Rows   Flowsheet Row Most Recent Value  Intake Tab  Referral Department  Cardiology  Unit at Time of Referral  Cardiac/Telemetry Unit  Palliative Care Primary Diagnosis  Cardiac  Palliative Care Type  Return patient Palliative Care  Reason for referral  Clarify Goals of Care, Counsel Regarding Hospice  Date first  seen by Palliative Care  07/02/16  Clinical Assessment  Palliative Performance Scale Score  40%  Pain Max last 24 hours  5  Pain Min Last 24 hours  4  Dyspnea Max Last 24 Hours  4  Dyspnea Min Last 24 hours  3  Psychosocial & Spiritual Assessment  Palliative Care Outcomes  Patient/Family meeting held?  Yes  Who was at the meeting?  patient who is decisional. no  family at the bedside.   Palliative Care Outcomes  Clarified goals of care      Time In:  9 Time Out:10   Time Total: 60  Greater than 50%  of this time was spent counseling and coordinating care related to the above assessment and plan.  Signed by: Rosalin Hawking, MD  [phone] Please contact Palliative Medicine Team phone at [phone] for questions and concerns.  For individual provider: See Loretha Stapler

## 2016-07-02 NOTE — Progress Notes (Addendum)
Advanced Heart Failure Rounding Note   Subjective:    Started on milrinone yesterday after cath with low output. Feels a bit better. Denies dyspnea.   RHC 06/30/16  RA mean 4 RV 44/2 PA 45/16, mean 31 PCWP mean 20 with v-waves to 31  Oxygen saturations: PA 49% AO 99%  Cardiac Output (Fick) 3.1  Cardiac Index (Fick) 1.54  Objective:   Weight Range:  Vital Signs:   Temp:  [97.4 F (36.3 C)-98.5 F (36.9 C)] 97.7 F (36.5 C) (11/18 0742) Pulse Rate:  [0-87] 78 (11/18 0800) Resp:  [0-26] 21 (11/18 0742) BP: (81-132)/(40-100) 97/63 (11/18 0800) SpO2:  [0 %-100 %] 97 % (11/18 0800) Weight:  [71.5 kg (157 lb 10.1 oz)-73.9 kg (162 lb 14.7 oz)] 71.5 kg (157 lb 10.1 oz) (11/18 0346) Last BM Date: 07/01/16  Weight change: Filed Weights   07/01/16 1049 07/01/16 1740 07/02/16 0346  Weight: 73.9 kg (163 lb) 73.9 kg (162 lb 14.7 oz) 71.5 kg (157 lb 10.1 oz)    Intake/Output:   Intake/Output Summary (Last 24 hours) at 07/02/16 1052 Last data filed at 07/02/16 0500  Gross per 24 hour  Intake           218.63 ml  Output             2400 ml  Net         -2181.37 ml    Physical Exam: General: Weak appearing. NAD.  HEENT: normal Neck: supple. JVP 6 cm. Carotids 2+ bilat; no bruits. No thyromegaly or nodule noted. Cor: PMI nondisplaced. Regular S1S2. No rubs, or murmurs. No S3. Lungs: clear Abdomen: soft, NT, ND, no HSM. No bruits or masses. +BS  Extremities: no cyanosis, clubbing, rash. No peripheral edema. RUE PICC Neuro: alert & orientedx3, cranial nerves grossly intact.   Telemetry: NSR 70-80 + PVCs (personally reviewed)  Labs: Basic Metabolic Panel:  Recent Labs Lab 06/28/16 1153 07/01/16 2133 07/02/16 0456  NA 135 136 137  K 4.3 3.5 3.5  CL 100* 100* 100*  CO2 25 27 28   GLUCOSE 99 108* 132*  BUN 14 13 14   CREATININE 1.24 1.09 1.18  CALCIUM 9.8 9.3 9.1    Liver Function Tests: No results for input(s): AST, ALT, ALKPHOS, BILITOT, PROT, ALBUMIN in  the last 168 hours. No results for input(s): LIPASE, AMYLASE in the last 168 hours. No results for input(s): AMMONIA in the last 168 hours.  CBC:  Recent Labs Lab 07/01/16 2133 07/02/16 0456  WBC 4.6 4.9  HGB 11.9* 11.8*  HCT 36.6* 36.2*  MCV 95.6 94.8  PLT 264 254    Cardiac Enzymes: No results for input(s): CKTOTAL, CKMB, CKMBINDEX, TROPONINI in the last 168 hours.  BNP: BNP (last 3 results)  Recent Labs  04/04/16 1300 04/26/16 1233 06/21/16 1146  BNP 327.0* 506.7* 760.3*    ProBNP (last 3 results) No results for input(s): PROBNP in the last 8760 hours.    Other results:  Imaging: Dg Chest Port 1 View  Result Date: 07/01/2016 CLINICAL DATA:  PICC line placement EXAM: PORTABLE CHEST 1 VIEW COMPARISON:  1827 hours on the same day FINDINGS: Right-sided PICC line tip is seen in the right atrium. Pullback approximately 3.7 cm placing the cavoatrial junction. Heart is enlarged. There is mild pulmonary vascular congestion consistent with CHF. Stable ICD and leads positions. No acute osseous abnormality. IMPRESSION: Right-sided PICC line tip is seen in the right atrium as before. Pullback approximately 3.7 cm. Cardiomegaly with mild  CHF. Electronically Signed   By: Tollie Ethavid  Kwon M.D.   On: 07/01/2016 19:41   Dg Chest Port 1 View  Result Date: 07/01/2016 CLINICAL DATA:  Right side PICC line placement EXAM: PORTABLE CHEST 1 VIEW COMPARISON:  06/21/2016 FINDINGS: Right PICC line has been placed with the tip in the mid right atrium approximately 3 cm from the cavoatrial junction. Left pacer is unchanged. Cardiomegaly. No confluent opacities, effusions or edema. IMPRESSION: Right PICC line tip in the mid right atrium approximately 3 cm below the cavoatrial junction. Stable cardiomegaly. Electronically Signed   By: Charlett NoseKevin  Dover M.D.   On: 07/01/2016 18:38      Medications:     Scheduled Medications: . atorvastatin  40 mg Oral q1800  . digoxin  0.0625 mg Oral Daily  .  guaiFENesin  1,200 mg Oral BID  . levETIRAcetam  500 mg Oral Daily  . Melatonin  3 mg Oral QHS  . methimazole  10 mg Oral BID  . mexiletine  200 mg Oral TID  . pantoprazole  40 mg Oral Daily  . polyethylene glycol  17 g Oral Daily  . Rivaroxaban  15 mg Oral Q supper  . sertraline  50 mg Oral Daily  . sodium chloride flush  10-40 mL Intracatheter Q12H  . sodium chloride flush  3 mL Intravenous Q12H  . sodium chloride flush  3 mL Intravenous Q12H  . spironolactone  25 mg Oral Daily  . torsemide  40 mg Oral Daily     Infusions: . sodium chloride 10 mL/hr at 07/02/16 0500  . milrinone 0.25 mcg/kg/min (07/02/16 0500)     PRN Medications:  sodium chloride, sodium chloride, acetaminophen, albuterol, LORazepam, meclizine, ondansetron (ZOFRAN) IV, sodium chloride flush, sodium chloride flush, sodium chloride flush, temazepam, traMADol   Assessment/Plan:    1.Acute/Chronic Systolic Heart Failure: Nonischemic cardiomyopathy.  St Jude CRT-D device, not set to BiV pace b/c ineffective (baseline QRS complex is now narrow). Nonischemic cardiomyopathy, EF 15-20% on 7/17 echo. NYHA class IIIB-IV symptoms. - RHC on 11/17. With optimized filling pressures and low output. Now on milrinone for palliation.  - Co-ox 49%-> 57% Continue milrinone 0.25.  - Losartan on hold. No b-blocker with low output.  - Continue torsemide, spiro and digoxin - For now he is not a candidate for advanced therapies given lack of social support => living in SNF currently.  Son lives near but cannot help much due to work schedule. 2. VT: Last shock 06/13/16. Off amiodarone due to hyperthyroidism.  Mexiletine was increased, no further events. No b-blocker due to low output. Now with PVCs and occasional NSVT - Keep K >= 4.0 Mg >= 2.0 3. Atrial fibrillation: Paroxysmal.  He is NSR today. Previously on Xarelto 15 daily (renally dosed). CrCl > 60.  Will increase to 20.    4. Hyperthyroidism: Seeing endocrine, he is on  methimazole and we've stopped amiodarone.    5. H/O PE: On Xarelto 6. Depression: Depressed mood[facility]  7. Hypokalemia: will supp 8. Dispo: Likely back to SNF on milrinone tomorrow or Monday. He prefers tomorrow.   Length of Stay: 1  Varian Innes MD 07/02/2016, 10:52 AM  Advanced Heart Failure Team Pager [phone] (M-F; 7a - 4p)  Please contact [facility] for night-coverage after hours (4p -7a ) and weekends on [email or web address]

## 2016-07-03 DIAGNOSIS — Z789 Other specified health status: Secondary | ICD-10-CM

## 2016-07-03 DIAGNOSIS — I509 Heart failure, unspecified: Secondary | ICD-10-CM

## 2016-07-03 LAB — BASIC METABOLIC PANEL
ANION GAP: 9 (ref 5–15)
Anion gap: 7 (ref 5–15)
BUN: 19 mg/dL (ref 6–20)
BUN: 18 mg/dL (ref 6–20)
CHLORIDE: 98 mmol/L — AB (ref 101–111)
CO2: 26 mmol/L (ref 22–32)
CO2: 27 mmol/L (ref 22–32)
CREATININE: 1.22 mg/dL (ref 0.61–1.24)
Calcium: 9.5 mg/dL (ref 8.9–10.3)
Calcium: 9.2 mg/dL (ref 8.9–10.3)
Chloride: 99 mmol/L — ABNORMAL LOW (ref 101–111)
Creatinine, Ser: 1.41 mg/dL — ABNORMAL HIGH (ref 0.61–1.24)
GFR calc non Af Amer: 50 mL/min — ABNORMAL LOW (ref >60)
GFR calc non Af Amer: 59 mL/min — ABNORMAL LOW (ref >60)
GFR, EST AFRICAN AMERICAN: 58 mL/min — AB (ref >60)
GLUCOSE: 187 mg/dL — AB (ref 65–99)
Glucose, Bld: 156 mg/dL — ABNORMAL HIGH (ref 65–99)
POTASSIUM: 3.9 mmol/L (ref 3.5–5.1)
Potassium: 3.9 mmol/L (ref 3.5–5.1)
SODIUM: 134 mmol/L — AB (ref 135–145)
Sodium: 132 mmol/L — ABNORMAL LOW (ref 135–145)

## 2016-07-03 LAB — MAGNESIUM
Magnesium: 1.7 mg/dL (ref 1.7–2.4)
Magnesium: 2.4 mg/dL (ref 1.7–2.4)

## 2016-07-03 LAB — COOXEMETRY PANEL
CARBOXYHEMOGLOBIN: 1.1 % (ref 0.5–1.5)
METHEMOGLOBIN: 0.8 % (ref 0.0–1.5)
O2 SAT: 54.6 %
TOTAL HEMOGLOBIN: 12.8 g/dL (ref 12.0–16.0)

## 2016-07-03 LAB — DIGOXIN LEVEL: Digoxin Level: 0.7 ng/mL — ABNORMAL LOW (ref 0.8–2.0)

## 2016-07-03 MED ORDER — MAGNESIUM SULFATE 2 GM/50ML IV SOLN
2.0000 g | Freq: Once | INTRAVENOUS | Status: AC
  Administered 2016-07-03: 2 g via INTRAVENOUS
  Filled 2016-07-03: qty 50

## 2016-07-03 MED ORDER — TORSEMIDE 20 MG PO TABS: 20.0000 mg | ORAL_TABLET | Freq: Every day | ORAL | Status: DC

## 2016-07-03 NOTE — Discharge Summary (Signed)
Discharge Summary    Patient ID: Johnathan Morrison,  MRN: [id number], DOB/AGE: Feb 20, 1946 70 y.o.  Admit date: 07/01/2016 Discharge date: 07/04/2016  Primary Care Provider: [facility] Primary Cardiologist: Dr. [name] (CHF Clinic) Primary Electrophysiologist: Dr. Hillis Range   Discharge Diagnoses    Principal Problem:   Acute on chronic systolic heart failure, NYHA class 4 (HCC) Active Problems:   A-fib (HCC)   ICD (St Jude) in place   Chronic renal disease, stage III   Cardiomyopathy-presumably NICM   NSVT (nonsustained ventricular tachycardia) (HCC)   Low output heart failure (HCC)   DNI (do not intubate)   Allergies Allergies  Allergen Reactions  . 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    Diagnostic Studies/Procedures    Right Heart Catheterization 07/01/16 RHC Procedural Findings (mmHg): Hemodynamics RA mean 4 RV 44/2 PA 45/16, mean 31 PCWP mean 20 with v-waves to 31 Oxygen saturations: PA 49% AO 99% Cardiac Output (Fick) 3.1  Cardiac Index (Fick) 1.54 1. Low output heart failure.  2. Elevated PCWP with prominent v-waves but RA pressure not elevated.  Will admit to place PICC, initiate milrinone.  Does not have social support for LVAD.  Will start milrinone, will also consult palliative care for goals of care as milrinone would be for palliation given significant low output symptoms.  _____________   History of Present Illness     Johnathan Morrison is a 70 y.o. 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 He has limited social support and lives in [place].  He is followed in the Advanced Heart Failure Clinic  and was recently noted to have marked fatigue and significant shortness of breath.  He was felt to possibly have low output congestive heart failure and he was set up for a R heart catheterization.  Hospital Course     Consultants: Palliative Care   1.Acute/Chronic Systolic Heart Failure: Nonischemic cardiomyopathy with St Jude CRT-D device, not set to BiV pace b/c ineffective (baseline QRS complex is now narrow).  EF 15-20% on 7/17 echo. NYHA class IIIB-IV symptoms.    He was brought in on 07/01/2016 for his procedure.  This demonstrated an elevated PCWP at 20 but mean RA was 4.  Cardiac index was 1.54.  He was felt to have relatively optimized filling pressures but did have evidence of low output congestive heart failure.  He was admitted for PICC line and to initiate IV Milrinone.  Losartan was placed on hold (low BP).  Of note, Milrinone was started strictly for palliation.  He is not felt to be an LVAD candidate.  Co-ox increased from 49% >> 57% >> 55% on Milrinone.  He is not on beta-blocker due to low output HF. He was evaluated this AM by Dr. [name].  The patient noted that he felt better with less dyspnea.  His CVP was 1. Therefore, his Torsemide was held and the plan is to resume at Torsemide 20 mg Once daily tomorrow, 07/05/16.   He will continue current digoxin and spironolactone.  He was seen by Palliative Care and decision was made to make him a limited code (DNI).  The plan is to DC him back to SNF with follow up  palliative services at the SNF.  He has minimal social support.  His son lives nearby but cannot help due to his work schedule.  As noted, he is not a candidate for advanced therapies at this time.   2. VT: Last shock 06/13/16. Off amiodarone due to hyperthyroidism. Mexiletine was increased, no further events.  He is not on b-blocker due to low output. He was noted to have PVCs on telemetry during this hospital stay.   3. Atrial fibrillation: Paroxysmal.  He has  maintained NSR.  He is on Xarelto 20 mg Once daily for anticoagulation.  4. CKD Creatinine improved to 1.12 on day of discharge with holding torsemide. He will need a BMET at his FU visit.   Dispo:   Pt will discharge today in stable condition to [facility] where they will jointly manage his Milrinone with [facility].   He will have close follow up as below.   _____________  Discharge Vitals Blood pressure 100/74, pulse 85, temperature 98.4 F (36.9 C), temperature source Oral, resp. rate 16, height 6\' 3"  (1.905 m), weight 153 lb 10.6 oz (69.7 kg), SpO2 95 %.  Filed Weights   07/02/16 0346 07/03/16 0300 07/04/16 0500  Weight: 157 lb 10.1 oz (71.5 kg) 155 lb 3.3 oz (70.4 kg) 153 lb 10.6 oz (69.7 kg)    Labs & Radiologic Studies    CBC  Recent Labs  07/02/16 0456 07/04/16 0500  WBC 4.9 6.2  HGB 11.8* 12.0*  HCT 36.2* 36.5*  MCV 94.8 94.3  PLT 254 241   Basic Metabolic Panel  Recent Labs  07/03/16 1639 07/04/16 0500  NA 132* 135  K 3.9 4.0  CL 98* 101  CO2 27 25  GLUCOSE 187* 113*  BUN 18 15  CREATININE 1.22 1.12  CALCIUM 9.2 9.3  MG 2.4 2.0   _____________    Dg Chest Port 1 View  Result Date: 07/01/2016 IMPRESSION: Right-sided PICC line tip is seen in the right atrium as before. Pullback approximately 3.7 cm. Cardiomegaly with mild CHF. Electronically Signed   By: Tollie Eth M.D.   On: 07/01/2016 19:41    Dg Chest Port 1 View   Result Date: 07/01/2016 IMPRESSION: Right PICC line tip in the mid right atrium approximately 3 cm below the cavoatrial junction. Stable cardiomegaly. Electronically Signed   By: Charlett Nose M.D.   On: 07/01/2016 18:38    Disposition   Pt is being discharged to SNF today in stable condition.  Follow-up Plans & Appointments    Follow-up Information    [facility] Follow up on 07/11/2016.   Specialty:  Cardiology Why:  at 2 pm for post hospital follow up. Please bring all  of your medications to your visit. The code for parking is 0004. Contact information: [address] [id number] mc [place] [address] [phone]         Discharge Instructions    Diet - low sodium heart healthy    Complete by:  As directed    Increase activity slowly    Complete by:  As directed       Discharge Medications   Current Discharge Medication List    START taking these medications   Details  milrinone (PRIMACOR) 20 MG/100 ML SOLN infusion Inject 18.475 mcg/min into the vein continuous. Qty: 100 mL, Refills: 0      CONTINUE these medications which have CHANGED   Details  torsemide (DEMADEX) 20 MG tablet  Take 1 tablet (20 mg total) by mouth daily. Qty: 60 tablet, Refills: 3      CONTINUE these medications which have NOT CHANGED   Details  acetaminophen (TYLENOL) 325 MG tablet Take 2 tablets (650 mg total) by mouth every 4 (four) hours as needed for headache or mild pain.    albuterol (PROVENTIL HFA;VENTOLIN HFA) 108 (90 Base) MCG/ACT inhaler Inhale 2 puffs into the lungs every 6 (six) hours as needed for wheezing or shortness of breath. Qty: 1 Inhaler, Refills: 0    atorvastatin (LIPITOR) 40 MG tablet Take 40 mg by mouth every morning.  Refills: 0    digoxin (LANOXIN) 0.125 MG tablet Take 0.5 tablets (0.0625 mg total) by mouth daily. Qty: 15 tablet, Refills: 6    guaiFENesin (MUCINEX) 600 MG 12 hr tablet Take 2 tablets (1,200 mg total) by mouth 2 (two) times daily. Qty: 120 tablet, Refills: 6    levETIRAcetam (KEPPRA) 500 MG tablet Take 500 mg by mouth daily.     LORazepam (ATIVAN) 0.5 MG tablet Take 0.5 mg by mouth every 8 (eight) hours as needed for anxiety. Reported on 02/23/2016    meclizine (ANTIVERT) 25 MG tablet Take 25 mg by mouth 2 (two) times daily as needed for dizziness.    Melatonin 3 MG TABS Take 3 mg by mouth at bedtime.    methimazole (TAPAZOLE) 10 MG tablet Take 1 tablet (10 mg total) by mouth 2 (two) times  daily. Qty: 60 tablet, Refills: 2    mexiletine (MEXITIL) 200 MG capsule Take 1 capsule (200 mg total) by mouth 3 (three) times daily. Qty: 90 capsule, Refills: 6    nitroGLYCERIN (NITROSTAT) 0.4 MG SL tablet Place 1 tablet (0.4 mg total) under the tongue every 5 (five) minutes as needed for chest pain. Qty: 30 tablet, Refills: 0    omeprazole (PRILOSEC) 20 MG capsule Take 20 mg by mouth daily.    polyethylene glycol (MIRALAX / GLYCOLAX) packet Take 17 g by mouth daily. Qty: 14 each, Refills: 0    Rivaroxaban (XARELTO) 15 MG TABS tablet Take 15 mg by mouth daily with supper.    sertraline (ZOLOFT) 50 MG tablet Take 50 mg by mouth daily.    spironolactone (ALDACTONE) 25 MG tablet Take 25 mg by mouth daily.    temazepam (RESTORIL) 7.5 MG capsule Take 7.5 mg by mouth at bedtime as needed for sleep.    traMADol (ULTRAM) 50 MG tablet Take 50 mg by mouth every 6 (six) hours as needed for moderate pain or severe pain.      STOP taking these medications     losartan (COZAAR) 25 MG tablet           Outstanding Labs/Studies   BMET at Follow Up visit   Duration of Discharge Encounter   Greater than 30 minutes including physician time.  Signed, Graciella FreerMichael Andrew Asja Frommer, PA-C  07/04/2016, 3:19 PM

## 2016-07-03 NOTE — Progress Notes (Signed)
Patient ID: Johnathan Morrison, male   DOB: 1946/06/08, 70 y.o.   MRN: [id number]    Advanced Heart Failure Rounding Note   Subjective:    Started on milrinone 11/17 after cath with low output. Feels better, no dyspnea, CVP 1, co-ox 55%.   Palliative care saw him yesterday, now DNI.   RHC 06/30/16  RA mean 4 RV 44/2 PA 45/16, mean 31 PCWP mean 20 with v-waves to 31 Oxygen saturations: PA 49% AO 99% Cardiac Output (Fick) 3.1  Cardiac Index (Fick) 1.54  Objective:   Weight Range:  Vital Signs:   Temp:  [97.2 F (36.2 C)-98.5 F (36.9 C)] 97.4 F (36.3 C) (11/19 0738) Pulse Rate:  [71-90] 81 (11/19 0738) Resp:  [18-19] 19 (11/18 1633) BP: (65-108)/(56-83) 101/66 (11/19 0738) SpO2:  [93 %-99 %] 94 % (11/19 0738) Weight:  [155 lb 3.3 oz (70.4 kg)] 155 lb 3.3 oz (70.4 kg) (11/19 0300) Last BM Date: 07/01/16  Weight change: Filed Weights   07/01/16 1740 07/02/16 0346 07/03/16 0300  Weight: 162 lb 14.7 oz (73.9 kg) 157 lb 10.1 oz (71.5 kg) 155 lb 3.3 oz (70.4 kg)    Intake/Output:   Intake/Output Summary (Last 24 hours) at 07/03/16 0850 Last data filed at 07/03/16 0600  Gross per 24 hour  Intake           1087.5 ml  Output             1450 ml  Net           -362.5 ml    Physical Exam: General: Weak appearing. NAD.  HEENT: normal Neck: supple. JVP 6 cm. Carotids 2+ bilat; no bruits. No thyromegaly or nodule noted. Cor: PMI nondisplaced. Regular S1S2. No rubs, or murmurs. No S3. Lungs: clear Abdomen: soft, NT, ND, no HSM. No bruits or masses. +BS  Extremities: no cyanosis, clubbing, rash. No peripheral edema. RUE PICC Neuro: alert & orientedx3, cranial nerves grossly intact.   Telemetry: NSR 70-80 + PVCs (personally reviewed)  Labs: Basic Metabolic Panel:  Recent Labs Lab 06/28/16 1153 07/01/16 2133 07/02/16 0456 07/03/16 0454  NA 135 136 137 134*  K 4.3 3.5 3.5 3.9  CL 100* 100* 100* 99*  CO2 25 27 28 26   GLUCOSE 99 108* 132* 156*  BUN 14 13 14  19   CREATININE 1.24 1.09 1.18 1.41*  CALCIUM 9.8 9.3 9.1 9.5  MG  --   --   --  1.7    Liver Function Tests: No results for input(s): AST, ALT, ALKPHOS, BILITOT, PROT, ALBUMIN in the last 168 hours. No results for input(s): LIPASE, AMYLASE in the last 168 hours. No results for input(s): AMMONIA in the last 168 hours.  CBC:  Recent Labs Lab 07/01/16 2133 07/02/16 0456  WBC 4.6 4.9  HGB 11.9* 11.8*  HCT 36.6* 36.2*  MCV 95.6 94.8  PLT 264 254    Cardiac Enzymes: No results for input(s): CKTOTAL, CKMB, CKMBINDEX, TROPONINI in the last 168 hours.  BNP: BNP (last 3 results)  Recent Labs  04/04/16 1300 04/26/16 1233 06/21/16 1146  BNP 327.0* 506.7* 760.3*    ProBNP (last 3 results) No results for input(s): PROBNP in the last 8760 hours.    Other results:  Imaging: Dg Chest Port 1 View  Result Date: 07/01/2016 CLINICAL DATA:  PICC line placement EXAM: PORTABLE CHEST 1 VIEW COMPARISON:  1827 hours on the same day FINDINGS: Right-sided PICC line tip is seen in the right atrium. Pullback approximately 3.7  cm placing the cavoatrial junction. Heart is enlarged. There is mild pulmonary vascular congestion consistent with CHF. Stable ICD and leads positions. No acute osseous abnormality. IMPRESSION: Right-sided PICC line tip is seen in the right atrium as before. Pullback approximately 3.7 cm. Cardiomegaly with mild CHF. Electronically Signed   By: Tollie Eth M.D.   On: 07/01/2016 19:41   Dg Chest Port 1 View  Result Date: 07/01/2016 CLINICAL DATA:  Right side PICC line placement EXAM: PORTABLE CHEST 1 VIEW COMPARISON:  06/21/2016 FINDINGS: Right PICC line has been placed with the tip in the mid right atrium approximately 3 cm from the cavoatrial junction. Left pacer is unchanged. Cardiomegaly. No confluent opacities, effusions or edema. IMPRESSION: Right PICC line tip in the mid right atrium approximately 3 cm below the cavoatrial junction. Stable cardiomegaly.  Electronically Signed   By: Charlett Nose M.D.   On: 07/01/2016 18:38     Medications:     Scheduled Medications: . atorvastatin  40 mg Oral q1800  . digoxin  0.0625 mg Oral Daily  . guaiFENesin  1,200 mg Oral BID  . levETIRAcetam  500 mg Oral Daily  . Melatonin  3 mg Oral QHS  . methimazole  10 mg Oral BID  . mexiletine  200 mg Oral TID  . pantoprazole  40 mg Oral Daily  . polyethylene glycol  17 g Oral Daily  . potassium chloride  40 mEq Oral Daily  . rivaroxaban  20 mg Oral Q supper  . sertraline  50 mg Oral Daily  . sodium chloride flush  10-40 mL Intracatheter Q12H  . sodium chloride flush  3 mL Intravenous Q12H  . sodium chloride flush  3 mL Intravenous Q12H  . spironolactone  25 mg Oral Daily  . [START ON 07/04/2016] torsemide  20 mg Oral Daily    Infusions: . sodium chloride 10 mL/hr at 07/03/16 0400  . milrinone 0.25 mcg/kg/min (07/03/16 0600)    PRN Medications: sodium chloride, sodium chloride, acetaminophen, albuterol, LORazepam, meclizine, ondansetron (ZOFRAN) IV, sodium chloride flush, sodium chloride flush, sodium chloride flush, temazepam, traMADol   Assessment/Plan:    1.Acute/Chronic Systolic Heart Failure: Nonischemic cardiomyopathy.  St Jude CRT-D device, not set to BiV pace b/c ineffective (baseline QRS complex is now narrow). Nonischemic cardiomyopathy, EF 15-20% on 7/17 echo. NYHA class IIIB-IV symptoms.  RHC on 11/17. With reasonable optimized filling pressures and low output. Now on milrinone for palliation.  - Co-ox 49%-> 57% -> 55%. Continue milrinone 0.25.  - Losartan on hold. No b-blocker with low output.  - Continue current digoxin and spironolactone.  - With CVP 1, hold torsemide today and decrease to 20 mg daily.  - For now, he is not a candidate for advanced therapies given minimal social support => living in SNF currently.  Son lives near but cannot help much due to work schedule.  Hopefully can get back to SNF on milrinone.  He was seen by  palliative care yesterday, now DNI.  Would like to have palliative services at SNF.  2. VT: Last shock 06/13/16. Off amiodarone due to hyperthyroidism.  Mexiletine was increased, no further events. No b-blocker due to low output. PVCs on telemetry overnight.  3. Atrial fibrillation: Paroxysmal.  He is NSR today. On Xarelto 20. 4. Hyperthyroidism: Seeing endocrine, he is on methimazole and we've stopped amiodarone.    5. H/O PE: On Xarelto 6. Depression: Depressed mood, very down about living at [facility].  7. Dispo: Likely back to SNF on milrinone  today or Monday.  He will need followup in CHF clinic with me or PA in 7-10 days.  Home meds: Milrinone 0.25, torsemide 20 mg daily, spironolactone 25 daily, mexiletine 200 tid, atorvastatin 40, Xarelto 20 daily, digoxin 0.0625 daily, methimazole 10 bid, non-cardiac meds as prior to admission.   Length of Stay: 2  [name] MD 07/03/2016, 8:50 AM  Advanced Heart Failure Team Pager [phone] (M-F; 7a - 4p)  Please contact [facility] for night-coverage after hours (4p -7a ) and weekends on [email or web address]

## 2016-07-03 NOTE — NC FL2 (Signed)
[place] MEDICAID FL2 LEVEL OF CARE SCREENING TOOL     IDENTIFICATION  Patient Name: Johnathan Morrison Birthdate: 10-27-1945 Sex: male Admission Date (Current Location): 07/01/2016  [facility] and [place] Number:  Producer, television/film/video and Address:  [facility], [address], [place], [place] [address]      Provider Number: [id number]  Attending Physician Name and Address:  Laurey Morale, MD  Relative Name and Phone Number:  Karter, Preiser, [phone]    Current Level of Care: Hospital Recommended Level of Care: Skilled Nursing Facility Prior Approval Number:    Date Approved/Denied:   PASRR Number: [id number]  Discharge Plan: SNF    Current Diagnoses: Patient Active Problem List   Diagnosis Date Noted  . Low output heart failure (HCC) 07/03/2016  . DNI (do not intubate) 07/03/2016  . Acute on chronic systolic heart failure, NYHA class 4 (HCC) 07/01/2016  . Protein-calorie malnutrition, severe 03/08/2016  . Severe protein-energy malnutrition (HCC) 03/08/2016  . Thyrotoxicosis   . Goals of care, counseling/discussion   . Palliative care encounter   . Loss of weight   . Chest pain 03/05/2016  . NSVT (nonsustained ventricular tachycardia) (HCC) 02/24/2016  . Chronic renal disease, stage III   . Diastolic dysfunction   . Pulmonary HTN - Rt heart cath June 2016   . COPD (chronic obstructive pulmonary disease) (HCC)   . History of PE-March 2016   . Cardiomyopathy-presumably NICM   . Acute on chronic systolic congestive heart failure (HCC)   . A-fib (HCC) 02/23/2016  . Chronic anticoagulation 02/23/2016  . 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 HTN (hypertension) 02/23/2016  . History of seizure 02/23/2016  . Acute on chronic systolic (congestive) heart failure 02/23/2016  . 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 SAH (subarachnoid hemorrhage)-Dec 2015 07/15/2014    Orientation RESPIRATION BLADDER Height & Weight      Self, Time, Situation, Place  Normal Continent Weight: 155 lb 3.3 oz (70.4 kg) Height:  6\' 3"  (190.5 cm)  BEHAVIORAL SYMPTOMS/MOOD NEUROLOGICAL BOWEL NUTRITION STATUS  Other (Comment) (None)   Continent Diet (See DC Summary)  AMBULATORY STATUS COMMUNICATION OF NEEDS Skin   Limited Assist Verbally Normal                       Personal Care Assistance Level of Assistance  Bathing, Dressing Bathing Assistance: Limited assistance   Dressing Assistance: Limited assistance     Functional Limitations Info             SPECIAL CARE FACTORS FREQUENCY  PT (By licensed PT)     PT Frequency: 5x wk OT Frequency: 5x wk            Contractures Contractures Info: Not present    Additional Factors Info  Code Status, Allergies Code Status Info: Partial Allergies Info: Amiodarone           Current Medications (07/03/2016):  This is the current hospital active medication list Current Facility-Administered Medications  Medication Dose Route Frequency Provider Last Rate Last Dose  . 0.9 %  sodium chloride infusion  250 mL Intravenous PRN Laurey Morale, MD      . 0.9 %  sodium chloride infusion   Intravenous Continuous Laurey Morale, MD 10 mL/hr at 07/03/16 0400    . 0.9 %  sodium chloride infusion  250 mL Intravenous PRN Amy Georgie Chard, NP      . acetaminophen (TYLENOL)  tablet 650 mg  650 mg Oral Q4H PRN Amy D Clegg, NP   650 mg at 07/03/16 0945  . albuterol (PROVENTIL) (2.5 MG/3ML) 0.083% nebulizer solution 3 mL  3 mL Inhalation Q6H PRN Amy D Clegg, NP      . atorvastatin (LIPITOR) tablet 40 mg  40 mg Oral q1800 Amy D Clegg, NP   40 mg at 07/02/16 1746  . digoxin (LANOXIN) tablet 0.0625 mg  0.0625 mg Oral Daily Amy D Clegg, NP   0.0625 mg at 07/03/16 0945  . guaiFENesin (MUCINEX) 12 hr tablet 1,200 mg  1,200 mg Oral BID Amy D Clegg, NP   1,200 mg at 07/03/16 0944  . levETIRAcetam (KEPPRA) tablet 500 mg  500 mg Oral Daily Amy D Clegg, NP   500 mg at 07/03/16 0945  .  LORazepam (ATIVAN) tablet 0.5 mg  0.5 mg Oral Q8H PRN Amy D Clegg, NP      . meclizine (ANTIVERT) tablet 25 mg  25 mg Oral BID PRN Amy Georgie Chard Clegg, NP      . Melatonin TABS 3 mg  3 mg Oral QHS Amy D Clegg, NP   3 mg at 07/02/16 2202  . methimazole (TAPAZOLE) tablet 10 mg  10 mg Oral BID Amy D Clegg, NP   10 mg at 07/03/16 0945  . mexiletine (MEXITIL) capsule 200 mg  200 mg Oral TID Sherald HessAmy D Clegg, NP   200 mg at 07/03/16 0944  . milrinone (PRIMACOR) 20 MG/100 ML (0.2 mg/mL) infusion  0.25 mcg/kg/min Intravenous Continuous Laurey Moralealton S McLean, MD 5.5 mL/hr at 07/03/16 1200 0.25 mcg/kg/min at 07/03/16 1200  . ondansetron (ZOFRAN) injection 4 mg  4 mg Intravenous Q6H PRN Amy D Clegg, NP      . pantoprazole (PROTONIX) EC tablet 40 mg  40 mg Oral Daily Amy D Clegg, NP      . polyethylene glycol (MIRALAX / GLYCOLAX) packet 17 g  17 g Oral Daily Amy D Clegg, NP      . potassium chloride SA (K-DUR,KLOR-CON) CR tablet 40 mEq  40 mEq Oral Daily Laurey Moralealton S McLean, MD   40 mEq at 07/03/16 0945  . rivaroxaban (XARELTO) tablet 20 mg  20 mg Oral Q supper Laurey Moralealton S McLean, MD   20 mg at 07/02/16 1653  . sertraline (ZOLOFT) tablet 50 mg  50 mg Oral Daily Amy D Clegg, NP   50 mg at 07/03/16 0946  . sodium chloride flush (NS) 0.9 % injection 10-40 mL  10-40 mL Intracatheter Q12H Laurey Moralealton S McLean, MD   10 mL at 07/03/16 0949  . sodium chloride flush (NS) 0.9 % injection 10-40 mL  10-40 mL Intracatheter PRN Laurey Moralealton S McLean, MD   20 mL at 07/01/16 2224  . sodium chloride flush (NS) 0.9 % injection 3 mL  3 mL Intravenous Q12H Laurey Moralealton S McLean, MD   3 mL at 07/03/16 0949  . sodium chloride flush (NS) 0.9 % injection 3 mL  3 mL Intravenous PRN Laurey Moralealton S McLean, MD      . sodium chloride flush (NS) 0.9 % injection 3 mL  3 mL Intravenous Q12H Amy D Clegg, NP   3 mL at 07/03/16 0949  . sodium chloride flush (NS) 0.9 % injection 3 mL  3 mL Intravenous PRN Amy D Clegg, NP      . spironolactone (ALDACTONE) tablet 25 mg  25 mg Oral Daily Amy D  Clegg, NP   25 mg at 07/03/16 0944  . temazepam (  RESTORIL) capsule 7.5 mg  7.5 mg Oral QHS PRN Amy D Clegg, NP      . Melene Muller ON 07/04/2016] torsemide (DEMADEX) tablet 20 mg  20 mg Oral Daily Laurey Morale, MD      . traMADol Janean Sark) tablet 50 mg  50 mg Oral Q6H PRN Amy Georgie Chard, NP         Discharge Medications: Please see discharge summary for a list of discharge medications.  Relevant Imaging Results:  Relevant Lab Results:   Additional Information    Sherrise Liberto B, LCSWA

## 2016-07-03 NOTE — Progress Notes (Signed)
  Patient's SNF will not accept patients on IV medications. SW to find new placement. DC pending SNF placement. Tereso Newcomer, PA-C   07/03/2016 4:38 PM

## 2016-07-03 NOTE — Progress Notes (Signed)
Johnathan Morrison, [place] notified of 14 beat run of VT. Pt asymptomatic.

## 2016-07-03 NOTE — Care Management Note (Signed)
Case Management Note  Patient Details  Name: Johnathan Morrison MRN: [id number] Date of Birth: 1946-06-14  Subjective/Objective:                  SOB Action/Plan: Discharge planning Expected Discharge Date:               Expected Discharge Plan:  Skilled Nursing Facility  In-House Referral:  Clinical Social Work  Discharge planning Services  CM Consult  Post Acute Care Choice:    Choice offered to:     DME Arranged:  N/A DME Agency:  NA  HH Arranged:  NA HH Agency:  NA  Status of Service:  Completed, signed off  If discussed at [employer], dates discussed:    Additional Comments: CM received call from RN concerning return of pt to [facility] in [facility] April Beverely Pace [phone]).  CM explained this can be accomplished by CSW and this CM relayed message to CSW [facility]?) [phone].  CM also placed a consult reiterating need for return to SNF.  No other CM needs were communicated. Yves Dill, RN 07/03/2016, 1:50 PM

## 2016-07-04 ENCOUNTER — Encounter (HOSPITAL_COMMUNITY): Payer: Medicare Other

## 2016-07-04 ENCOUNTER — Encounter (HOSPITAL_COMMUNITY): Payer: Self-pay | Admitting: Cardiology

## 2016-07-04 LAB — CBC
HEMATOCRIT: 36.5 % — AB (ref 39.0–52.0)
HEMOGLOBIN: 12 g/dL — AB (ref 13.0–17.0)
MCH: 31 pg (ref 26.0–34.0)
MCHC: 32.9 g/dL (ref 30.0–36.0)
MCV: 94.3 fL (ref 78.0–100.0)
PLATELETS: 241 10*3/uL (ref 150–400)
RBC: 3.87 MIL/uL — AB (ref 4.22–5.81)
RDW: 14.1 % (ref 11.5–15.5)
WBC: 6.2 10*3/uL (ref 4.0–10.5)

## 2016-07-04 LAB — COOXEMETRY PANEL
Carboxyhemoglobin: 1 % (ref 0.5–1.5)
Methemoglobin: 0.7 % (ref 0.0–1.5)
O2 SAT: 54.8 %
Total hemoglobin: 18.7 g/dL — ABNORMAL HIGH (ref 12.0–16.0)

## 2016-07-04 LAB — BASIC METABOLIC PANEL
Anion gap: 9 (ref 5–15)
BUN: 15 mg/dL (ref 6–20)
CALCIUM: 9.3 mg/dL (ref 8.9–10.3)
CHLORIDE: 101 mmol/L (ref 101–111)
CO2: 25 mmol/L (ref 22–32)
CREATININE: 1.12 mg/dL (ref 0.61–1.24)
GFR calc non Af Amer: >60 mL/min (ref >60)
GLUCOSE: 113 mg/dL — AB (ref 65–99)
Potassium: 4 mmol/L (ref 3.5–5.1)
Sodium: 135 mmol/L (ref 135–145)

## 2016-07-04 LAB — MAGNESIUM: Magnesium: 2 mg/dL (ref 1.7–2.4)

## 2016-07-04 MED ORDER — MILRINONE LACTATE IN DEXTROSE 20-5 MG/100ML-% IV SOLN: 0.2500 ug/kg/min | INTRAVENOUS | 0 refills | Status: AC

## 2016-07-04 MED ORDER — TORSEMIDE 20 MG PO TABS: 20.0000 mg | ORAL_TABLET | Freq: Every day | ORAL | 3 refills | Status: AC

## 2016-07-04 MED ORDER — TORSEMIDE 20 MG PO TABS: 20.0000 mg | ORAL_TABLET | Freq: Every day | ORAL | Status: DC

## 2016-07-04 NOTE — Progress Notes (Signed)
Patient ID: Jovin Fester, male   DOB: Aug 13, 1946, 70 y.o.   MRN: [id number]    Advanced Heart Failure Rounding Note   Subjective:    Started on milrinone 11/17 after cath with low output.  Coox 54.8% this am. CVP 1-2. Creatinine, Mg, and K stable.   Feeling OK.  Does NOT want to go to SNF in [place]. Wants to be in [place].  Denies dyspnea, lightheadedness, or dizziness, but hasn't had meds this am.   Palliative care saw him 07/02/16. Pt now DNI.   RHC 06/30/16  RA mean 4 RV 44/2 PA 45/16, mean 31 PCWP mean 20 with v-waves to 31 Oxygen saturations: PA 49% AO 99% Cardiac Output (Fick) 3.1  Cardiac Index (Fick) 1.54  Objective:   Weight Range:  Vital Signs:   Temp:  [97.4 F (36.3 C)-97.9 F (36.6 C)] 97.8 F (36.6 C) (11/20 0400) Pulse Rate:  [81-92] 89 (11/19 1600) Resp:  [16-98] 16 (11/19 1557) BP: (84-111)/(62-85) 84/62 (11/20 0400) SpO2:  [94 %-100 %] 97 % (11/19 1600) Weight:  [153 lb 10.6 oz (69.7 kg)] 153 lb 10.6 oz (69.7 kg) (11/20 0500) Last BM Date: 07/01/16  Weight change: Filed Weights   07/02/16 0346 07/03/16 0300 07/04/16 0500  Weight: 157 lb 10.1 oz (71.5 kg) 155 lb 3.3 oz (70.4 kg) 153 lb 10.6 oz (69.7 kg)    Intake/Output:   Intake/Output Summary (Last 24 hours) at 07/04/16 0735 Last data filed at 07/04/16 0600  Gross per 24 hour  Intake           1226.5 ml  Output              300 ml  Net            926.5 ml    Physical Exam: CVP 1 General: Chronically ill and elderly appearing. NAD.  HEENT: Normal Neck: supple. JVP flat Carotids 2+ bilat; no bruits. No thyromegaly or nodule noted. Cor: PMI nondisplaced. Regular S1S2. No rubs, or murmurs. No S3. Lungs: CTAB, normal effort Abdomen: soft, NT, ND, no HSM. No bruits or masses. +BS  Extremities: no cyanosis, clubbing, rash. No peripheral edema. RUE PICC Neuro: alert & orientedx3, cranial nerves grossly intact.   Telemetry: Reviewed, 70-80s with PVCs  Labs: Basic Metabolic  Panel:  Recent Labs Lab 07/01/16 2133 07/02/16 0456 07/03/16 0454 07/03/16 1639 07/04/16 0500  NA 136 137 134* 132* 135  K 3.5 3.5 3.9 3.9 4.0  CL 100* 100* 99* 98* 101  CO2 27 28 26 27 25   GLUCOSE 108* 132* 156* 187* 113*  BUN 13 14 19 18 15   CREATININE 1.09 1.18 1.41* 1.22 1.12  CALCIUM 9.3 9.1 9.5 9.2 9.3  MG  --   --  1.7 2.4 2.0    Liver Function Tests: No results for input(s): AST, ALT, ALKPHOS, BILITOT, PROT, ALBUMIN in the last 168 hours. No results for input(s): LIPASE, AMYLASE in the last 168 hours. No results for input(s): AMMONIA in the last 168 hours.  CBC:  Recent Labs Lab 07/01/16 2133 07/02/16 0456 07/04/16 0500  WBC 4.6 4.9 6.2  HGB 11.9* 11.8* 12.0*  HCT 36.6* 36.2* 36.5*  MCV 95.6 94.8 94.3  PLT 264 254 241    Cardiac Enzymes: No results for input(s): CKTOTAL, CKMB, CKMBINDEX, TROPONINI in the last 168 hours.  BNP: BNP (last 3 results)  Recent Labs  04/04/16 1300 04/26/16 1233 06/21/16 1146  BNP 327.0* 506.7* 760.3*    ProBNP (last 3 results) No  results for input(s): PROBNP in the last 8760 hours.    Other results:  Imaging: No results found.   Medications:     Scheduled Medications: . atorvastatin  40 mg Oral q1800  . digoxin  0.0625 mg Oral Daily  . guaiFENesin  1,200 mg Oral BID  . levETIRAcetam  500 mg Oral Daily  . Melatonin  3 mg Oral QHS  . methimazole  10 mg Oral BID  . mexiletine  200 mg Oral TID  . pantoprazole  40 mg Oral Daily  . polyethylene glycol  17 g Oral Daily  . potassium chloride  40 mEq Oral Daily  . rivaroxaban  20 mg Oral Q supper  . sertraline  50 mg Oral Daily  . sodium chloride flush  10-40 mL Intracatheter Q12H  . sodium chloride flush  3 mL Intravenous Q12H  . sodium chloride flush  3 mL Intravenous Q12H  . spironolactone  25 mg Oral Daily  . torsemide  20 mg Oral Daily    Infusions: . sodium chloride 10 mL/hr at 07/04/16 0400  . milrinone 0.25 mcg/kg/min (07/04/16 0600)    PRN  Medications: sodium chloride, sodium chloride, acetaminophen, albuterol, LORazepam, meclizine, ondansetron (ZOFRAN) IV, sodium chloride flush, sodium chloride flush, sodium chloride flush, temazepam, traMADol   Assessment/Plan:    1.Acute/Chronic Systolic Heart Failure: Nonischemic cardiomyopathy.  St Jude CRT-D device, not set to BiV pace b/c ineffective (baseline QRS complex is now narrow). Nonischemic cardiomyopathy, EF 15-20% on 7/17 echo. NYHA class IIIB-IV symptoms.  RHC on 11/17. With reasonable optimized filling pressures and low output. Now on milrinone for palliation.  - Co-ox 49%-> 57% -> 55% ->55%. Continue milrinone 0.25.  - Losartan on hold. No b-blocker with low output.  - Continue current digoxin and spironolactone.  - CVP remains 1-2. Hold torsemide again today. Will plan on 20 mg daily tomorrow. hold torsemide today and decrease to 20 mg daily.  - For now, he is not a candidate for advanced therapies given minimal social support => living in SNF currently.  Son lives near but cannot help much due to work schedule. - Plan to d/c to SNF on milrinone once appropriate SNF arranged.    - Pt was seen 07/02/16 by palliative. Now DNI. Would like to have palliative services at SNF.  2. VT: Last shock 06/13/16. Off amiodarone due to hyperthyroidism.  Mexiletine was increased, no further events. No b-blocker due to low output.  - PVCs on tele.   3. Atrial fibrillation: Paroxysmal.  - Remains in NSR.  - Continue Xarelto 20. 4. Hyperthyroidism: Seeing endocrine, he is on methimazole and we've stopped amiodarone.    5. H/O PE: On Xarelto 6. Depression: Depressed mood, very down about living at [facility].  7. Dispo: To SNF on milrinone once arranged.  - Will plan follow up in CHF clinic next week.   Home meds: Milrinone 0.25, torsemide 20 mg daily, spironolactone 25 daily, mexiletine 200 tid, atorvastatin 40, Xarelto 20 daily, digoxin 0.0625 daily, methimazole 10 bid, non-cardiac meds as prior  to admission.   Length of Stay: 3  Luane School 07/04/2016, 7:35 AM  Advanced Heart Failure Team Pager [phone] (M-F; 7a - 4p)  Please contact [facility] for night-coverage after hours (4p -7a ) and weekends on [email or web address]  Patient seen with PA, agree with the above note.  He feels better on milrinone.  Co-ox improved but still marginal.  Would try not to increase more as long as he is doing  well symptomatically as he has had VT in the past, now on mexiletine.  CVP 3 for me, restart torsemide tomorrow.  Will work on finding him SNF where he can be on milrinone.  Need social work involvement today.   [name] Samual Beals 07/04/2016 8:19 AM

## 2016-07-04 NOTE — Clinical Social Work Placement (Signed)
   CLINICAL SOCIAL WORK PLACEMENT  NOTE  Date:  07/04/2016  Patient Details  Name: Johnathan Morrison MRN: [id number] Date of Birth: 09-15-1945  Clinical Social Work is seeking post-discharge placement for this patient at the Skilled  Nursing Facility level of care (*CSW will initial, date and re-position this form in  chart as items are completed):  Yes   Patient/family provided with [place] Clinical Social Work Department's list of facilities offering this level of care within the geographic area requested by the patient (or if unable, by the patient's family).  Yes   Patient/family informed of their freedom to choose among providers that offer the needed level of care, that participate in Medicare, Medicaid or managed care program needed by the patient, have an available bed and are willing to accept the patient.  Yes   Patient/family informed of [facility]'s ownership interest in [facility] and [facility], as well as of the fact that they are under no obligation to receive care at these facilities.  PASRR submitted to EDS on       PASRR number received on       Existing PASRR number confirmed on 07/04/16     FL2 transmitted to all facilities in geographic area requested by pt/family on 07/04/16     FL2 transmitted to all facilities within larger geographic area on       Patient informed that his/her managed care company has contracts with or will negotiate with certain facilities, including the following:        Yes   Patient/family informed of bed offers received.  Patient chooses bed at [facility]     Physician recommends and patient chooses bed at      Patient to be transferred to [facility] on 07/04/16.  Patient to be transferred to facility by PTAR     Patient family notified on 07/04/16 of transfer.  Name of family member notified:        PHYSICIAN Please sign FL2     Additional Comment:     _______________________________________________ Mearl Latin, LCSWA 07/04/2016, 4:47 PM

## 2016-07-04 NOTE — Progress Notes (Signed)
Pt discharge instructions complete. Pt expresses understanding and has no questions, concerns or comments at this time. Pt is transferring to [facility] with IV Milrinone infusing into a right upper arm PICC. PICC patent and intact. Dressing dry clean and intact. Attempts made to call report to receiving nurse at [facility] but was unable to reach anyone. Spoke with Burna Mortimer at the front desk who stated she couldn't get anyone to pick up. RN's name and number left for receiving nurse to call back to receive report. Burna Mortimer was advised that patient was leaving facility when EMS arrived

## 2016-07-04 NOTE — Progress Notes (Signed)
Patient will DC to: [facility]  Anticipated DC date: 07/04/16 Family notified: Son Transport by: [facility] 5:30pm   Per MD patient ready for DC to [facility]. RN, patient, patient's family, and facility notified of DC. Discharge Summary sent to facility. RN given number for report. DC packet on chart. Ambulance transport requested for patient.   CSW signing off.  Cristobal Goldmann, [place] Clinical Social Worker [phone]

## 2016-07-04 NOTE — Discharge Instructions (Signed)

## 2016-07-04 NOTE — Progress Notes (Addendum)
Report given to Nurse Bill at [facility]. All questions answered, name and number given to call if any further questions arise. Nurse Bill informed of pt having already received evening xarelto

## 2016-07-04 NOTE — Progress Notes (Signed)
CARDIAC REHAB PHASE I   Pt initially agreed to ambulation, however, requested to speak with MD prior to ambulation regarding SNF placement. Upon return to room, pt states he received some upsetting news and does not want to walk. Pt declines ambulation at this time, offered to return this afternoon, pt declined. Pt in bed, call bell within reach.   Joylene Grapes, RN, BSN 07/04/2016 10:58 AM

## 2016-07-04 NOTE — Clinical Social Work Note (Signed)
Clinical Social Work Assessment  Patient Details  Name: Johnathan Morrison MRN: [id number] Date of Birth: 06-27-1946  Date of referral:  07/04/16               Reason for consult:  Facility Placement                Permission sought to share information with:  Facility Sport and exercise psychologist, Family Supports Permission granted to share information::  Yes, Verbal Permission Granted  Name::     Johnathan Morrison  Agency::  SNFs  Relationship::  Son  Contact Information:  [phone]  Housing/Transportation Living arrangements for the past 2 months:  [place] of Information:  Patient Patient Interpreter Needed:  None Criminal Activity/Legal Involvement Pertinent to Current Situation/Hospitalization:  No - Comment as needed Significant Relationships:  Adult Children Lives with:  Facility Resident Do you feel safe going back to the place where you live?  Yes Need for family participation in patient care:  No (Coment)  Care giving concerns:  CSW received consult for possible SNF placement at time of discharge. CSW met with patient regarding PT recommendation of SNF placement at time of discharge. Patient expressed understanding of PT recommendation and is agreeable to SNF placement at time of discharge. However, patient reports frustration that he has to go to snf in [place] instead of [place] due to the milrinone. Patient was staying at [facility]. CSW to continue to follow and assist with discharge planning needs.   Social Worker assessment / plan:  CSW spoke with patient  concerning possibility of rehab at [facility] before returning home.  Employment status:  Retired Forensic scientist:  Information systems manager, Medicaid In [place] PT Recommendations:  [place] / Referral to community resources:  [place]  Patient/Family's Response to care:  Patient recognizes need for rehab snd may be agreeable to a SNF in [place].    Patient/Family's Understanding of and Emotional Response to Diagnosis, Current Treatment, and Prognosis:  Patient/family is realistic regarding therapy needs and expressed being hopeful for SNF placement. Patient expressed understanding of CSW role and discharge process. No questions/concerns about plan or treatment.    Emotional Assessment Appearance:  Appears stated age Attitude/Demeanor/Rapport:  Other (Appropriate) Affect (typically observed):  Angry Orientation:  Oriented to Self, Oriented to Situation, Oriented to Place, Oriented to  Time Alcohol / Substance use:  Not Applicable Psych involvement (Current and /or in the community):  No (Comment)  Discharge Needs  Concerns to be addressed:  Care Coordination Readmission within the last 30 days:  No Current discharge risk:  None Barriers to Discharge:  No Barriers Identified   Benard Halsted, [place] 07/04/2016, 12:35 PM

## 2016-07-05 ENCOUNTER — Encounter (HOSPITAL_COMMUNITY): Payer: Medicare Other

## 2016-07-11 ENCOUNTER — Inpatient Hospital Stay (HOSPITAL_COMMUNITY): Admit: 2016-07-11 | Payer: Medicare Other

## 2016-07-13 ENCOUNTER — Encounter: Payer: Medicare Other | Admitting: *Deleted

## 2016-07-13 ENCOUNTER — Telehealth: Payer: Self-pay | Admitting: Cardiology

## 2016-07-13 ENCOUNTER — Ambulatory Visit (HOSPITAL_COMMUNITY)
Admission: RE | Admit: 2016-07-13 | Discharge: 2016-07-13 | Disposition: A | Discharge status: Home / Self Care | Payer: No Typology Code available for payment source | Source: Ambulatory Visit | Attending: Internal Medicine | Admitting: Internal Medicine

## 2016-07-13 VITALS — BP 102/60 | HR 100 | Wt 163.6 lb

## 2016-07-13 DIAGNOSIS — F329 Major depressive disorder, single episode, unspecified: Secondary | ICD-10-CM | POA: Insufficient documentation

## 2016-07-13 DIAGNOSIS — Z87891 Personal history of nicotine dependence: Secondary | ICD-10-CM | POA: Insufficient documentation

## 2016-07-13 DIAGNOSIS — Z8673 Personal history of transient ischemic attack (TIA), and cerebral infarction without residual deficits: Secondary | ICD-10-CM | POA: Insufficient documentation

## 2016-07-13 DIAGNOSIS — Z95810 Presence of automatic (implantable) cardiac defibrillator: Secondary | ICD-10-CM | POA: Insufficient documentation

## 2016-07-13 DIAGNOSIS — Z888 Allergy status to other drugs, medicaments and biological substances status: Secondary | ICD-10-CM | POA: Diagnosis not present

## 2016-07-13 DIAGNOSIS — I5022 Chronic systolic (congestive) heart failure: Secondary | ICD-10-CM | POA: Insufficient documentation

## 2016-07-13 DIAGNOSIS — Z79899 Other long term (current) drug therapy: Secondary | ICD-10-CM | POA: Diagnosis not present

## 2016-07-13 DIAGNOSIS — Z7901 Long term (current) use of anticoagulants: Secondary | ICD-10-CM | POA: Insufficient documentation

## 2016-07-13 DIAGNOSIS — E0590 Thyrotoxicosis, unspecified without thyrotoxic crisis or storm: Secondary | ICD-10-CM | POA: Diagnosis not present

## 2016-07-13 DIAGNOSIS — I428 Other cardiomyopathies: Secondary | ICD-10-CM | POA: Insufficient documentation

## 2016-07-13 DIAGNOSIS — Z86711 Personal history of pulmonary embolism: Secondary | ICD-10-CM | POA: Insufficient documentation

## 2016-07-13 DIAGNOSIS — G40909 Epilepsy, unspecified, not intractable, without status epilepticus: Secondary | ICD-10-CM | POA: Diagnosis not present

## 2016-07-13 DIAGNOSIS — I5189 Other ill-defined heart diseases: Secondary | ICD-10-CM

## 2016-07-13 DIAGNOSIS — J449 Chronic obstructive pulmonary disease, unspecified: Secondary | ICD-10-CM | POA: Insufficient documentation

## 2016-07-13 DIAGNOSIS — I519 Heart disease, unspecified: Secondary | ICD-10-CM | POA: Diagnosis present

## 2016-07-13 DIAGNOSIS — N183 Chronic kidney disease, stage 3 (moderate): Secondary | ICD-10-CM | POA: Insufficient documentation

## 2016-07-13 DIAGNOSIS — I509 Heart failure, unspecified: Secondary | ICD-10-CM

## 2016-07-13 DIAGNOSIS — I429 Cardiomyopathy, unspecified: Secondary | ICD-10-CM

## 2016-07-13 DIAGNOSIS — I481 Persistent atrial fibrillation: Secondary | ICD-10-CM

## 2016-07-13 DIAGNOSIS — I4819 Other persistent atrial fibrillation: Secondary | ICD-10-CM

## 2016-07-13 DIAGNOSIS — I2510 Atherosclerotic heart disease of native coronary artery without angina pectoris: Secondary | ICD-10-CM | POA: Insufficient documentation

## 2016-07-13 DIAGNOSIS — I480 Paroxysmal atrial fibrillation: Secondary | ICD-10-CM | POA: Diagnosis not present

## 2016-07-13 LAB — BASIC METABOLIC PANEL
ANION GAP: 10 (ref 5–15)
BUN: 16 mg/dL (ref 6–20)
CALCIUM: 9.4 mg/dL (ref 8.9–10.3)
CO2: 25 mmol/L (ref 22–32)
CREATININE: 1.24 mg/dL (ref 0.61–1.24)
Chloride: 102 mmol/L (ref 101–111)
GFR, EST NON AFRICAN AMERICAN: 58 mL/min — AB (ref >60)
Glucose, Bld: 109 mg/dL — ABNORMAL HIGH (ref 65–99)
Potassium: 3.6 mmol/L (ref 3.5–5.1)
Sodium: 137 mmol/L (ref 135–145)

## 2016-07-13 LAB — CBC
HCT: 37.7 % — ABNORMAL LOW (ref 39.0–52.0)
HEMOGLOBIN: 12.4 g/dL — AB (ref 13.0–17.0)
MCH: 31.3 pg (ref 26.0–34.0)
MCHC: 32.9 g/dL (ref 30.0–36.0)
MCV: 95.2 fL (ref 78.0–100.0)
PLATELETS: 230 10*3/uL (ref 150–400)
RBC: 3.96 MIL/uL — AB (ref 4.22–5.81)
RDW: 13.7 % (ref 11.5–15.5)
WBC: 4.4 10*3/uL (ref 4.0–10.5)

## 2016-07-13 LAB — COOXEMETRY PANEL
Carboxyhemoglobin: 1.1 % (ref 0.5–1.5)
METHEMOGLOBIN: 0.9 % (ref 0.0–1.5)
O2 SAT: 53.6 %
TOTAL HEMOGLOBIN: 13.2 g/dL (ref 12.0–16.0)

## 2016-07-13 NOTE — Patient Instructions (Signed)
Your physician recommends that you schedule a follow-up appointment in: 2-3 weeks  

## 2016-07-13 NOTE — Telephone Encounter (Signed)
Attempted to confirm remote transmission with pt. No answer and was unable to leave a message.   

## 2016-07-13 NOTE — Progress Notes (Signed)
Patient ID: Johnathan Morrison, male   DOB: July 09, 1946, 70 y.o.   MRN: [id number]    Advanced Heart Failure Clinic Note    Primary Care: [facility] Primary Cardiologist: Dr. Diona Browner Primary HF: Dr. Shirlee Latch   HPI: Kameryn Tisdel is a 70 y.o. 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  He was admitted to [facility] 02/23/16 with SOB and ?syncopal episode. ICD interrogation showed 2 shocks. Transferred to [facility] for HF and EP evaluation.  Echo with LVEF 15-20% range. Started on milrinone and weaned off prior to d/c. He was over-diuresed with CVP around 2 and received gentle IVF and diuretics adjusted.   He presented to ED on 03/03/16 with lightheadedness and received IV fluid and diuretics were held.  He had very close follow up scheduled but again reported to ED on 03/05/16 with chest pain, dyspnea, and poor appetite. Had repeat RHC that admission with marginal cardiac output.  He was being considered as a VAD but there were a concerns about lack of social support.  He was also noted to be hyperthyroid in the setting of amiodarone use. Amiodarone was stopped and he is now on mexiletine.  On 06/13/16, He went to the ER with another ICD discharge.  VT was noted.  EP was consulted and increased his mexiletine.   Admitted 07/01/16 with RHC showing low output as below. Torsemide decreased with CVP down to 1.  Coox remained marginal despite milrinone support.  Pt made a DNI but requested all else be done.  SNF had to be changed to one in [place] milrinone could be arranged as outpatient.   He returns today for post hospital follow up. Main complaint is fatigue. On going since discharge.  Breathing is difficult. SOB with minimal exertion including changing clothes and bathing. Feels  worse than when he came in to hospital the first time.  Continues to have atypical chest pain worse with deep inspiration.  No orthopnea/PND.  No palpitations. Occasional lightheadedness or dizziness.   RHC 07/01/16 Hemodynamics RA mean 4 RV 44/2 PA 45/16, mean 31 PCWP mean 20 with v-waves to 31 Oxygen saturations: PA 49% AO 99% Cardiac Output (Fick) 3.1  Cardiac Index (Fick) 1.54  Labs (8/17): digoxin 0.7 Labs (10/17): K 3.8, creatinine 1.2  Labs (11/17): K 4, creatinine 1.19, BNP 760, digoxin 1.1, hgb 12.7  PMH: 1. Chronic systolic CHF: Nonischemic cardiomyopathy.   - St Jude CRT-D => nonresponder, BiV pacing off.  - LHC/RHC (02/25/16): RA 9, PA 36/12, PCWP mean 13, CI 1.89.  Nonobstructive CAD.  - RHC (03/08/16): RA 2, PA 31/12, PCWP mean 7, CI 2.15 - Echo (7/17): Moderate LV dilation with EF 15-20%, severe diffuse hypokinesis, moderate to severe biatrial enlargement, moderately dilated RV with decreased systolic function, moderate MR.  2. Atrial fibrillation: Paroxysmal.  3. PE: 3/16, had IVC filter that was removed in 10/16.  4. CVA: 2/16.  5. CKD stage III.  6. H/o VT 7. Seizure disorder 8. COPD: Prior smoker.  9. H/o SAH 10. CAD: Nonobstructive on 7/17 cath.  11. 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   Current Outpatient Prescriptions  Medication Sig Dispense Refill  . acetaminophen (TYLENOL) 325 MG tablet Take 2 tablets (650 mg total) by mouth every 4 (four) hours as needed for headache or mild pain.    Marland Kitchen  atorvastatin (LIPITOR) 40 MG tablet Take 40 mg by mouth every morning.   0  . digoxin (LANOXIN) 0.125 MG tablet Take 0.5 tablets (0.0625 mg total) by mouth daily. 15 tablet 6  . guaiFENesin (MUCINEX) 600 MG 12 hr tablet Take 2 tablets (1,200 mg total) by mouth 2 (two) times daily. 120 tablet 6  . levETIRAcetam (KEPPRA) 500 MG tablet Take 500 mg by mouth daily.     Marland Kitchen LORazepam (ATIVAN) 0.5 MG tablet Take 0.5 mg by mouth every 8 (eight) hours as needed for  anxiety. Reported on 02/23/2016    . meclizine (ANTIVERT) 25 MG tablet Take 25 mg by mouth 2 (two) times daily as needed for dizziness.    . Melatonin 3 MG TABS Take 3 mg by mouth at bedtime.    . methimazole (TAPAZOLE) 10 MG tablet Take 1 tablet (10 mg total) by mouth 2 (two) times daily. (Patient taking differently: Take 10 mg by mouth 3 (three) times daily. ) 60 tablet 2  . mexiletine (MEXITIL) 200 MG capsule Take 1 capsule (200 mg total) by mouth 3 (three) times daily. 90 capsule 6  . milrinone (PRIMACOR) 20 MG/100 ML SOLN infusion Inject 18.475 mcg/min into the vein continuous. 100 mL 0  . nitroGLYCERIN (NITROSTAT) 0.4 MG SL tablet Place 1 tablet (0.4 mg total) under the tongue every 5 (five) minutes as needed for chest pain. 30 tablet 0  . omeprazole (PRILOSEC) 20 MG capsule Take 20 mg by mouth daily.    . polyethylene glycol (MIRALAX / GLYCOLAX) packet Take 17 g by mouth daily. 14 each 0  . Rivaroxaban (XARELTO) 15 MG TABS tablet Take 15 mg by mouth daily with supper.    . sertraline (ZOLOFT) 50 MG tablet Take 50 mg by mouth daily.    Marland Kitchen spironolactone (ALDACTONE) 25 MG tablet Take 25 mg by mouth daily.    . temazepam (RESTORIL) 7.5 MG capsule Take 7.5 mg by mouth at bedtime as needed for sleep.    Marland Kitchen torsemide (DEMADEX) 20 MG tablet Take 1 tablet (20 mg total) by mouth daily. 60 tablet 3  . traMADol (ULTRAM) 50 MG tablet Take 50 mg by mouth every 6 (six) hours as needed for moderate pain or severe pain.    Marland Kitchen albuterol (PROVENTIL HFA;VENTOLIN HFA) 108 (90 Base) MCG/ACT inhaler Inhale 2 puffs into the lungs every 6 (six) hours as needed for wheezing or shortness of breath. 1 Inhaler 0   No current facility-administered medications for this encounter.     Allergies  Allergen Reactions  . 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 Marital status: Single    Spouse name: N/A  . Number of children: N/A  .  Years of education: N/A   Occupational History  . Not on file.   Social History Main Topics  . Smoking status: Former Games developer  . Smokeless tobacco: Never Used     Comment: smoked 1.5 ppd x 30 years, quit in 2012)  . Alcohol use No  . Drug use: No  . Sexual activity: Not on file   Other Topics Concern  . Not on file   Social History Narrative  . No narrative on file      Family History  Problem Relation Age of Onset  . Cardiomyopathy    . Sudden death      Vitals:   Aug 05, 2016 1022  BP: 102/60  Pulse: 100  SpO2: 98%  Weight: 163 lb 9.6 oz (74.2 kg)   Wt Readings from Last 3 Encounters:  07/13/16 163 lb 9.6 oz (74.2 kg)  07/04/16 153 lb 10.6 oz (69.7 kg)  06/28/16 163 lb 12.8 oz (74.3 kg)    Physical Exam: General: Chronically ill and fatigued appearing.  HEENT: Normal Neck: supple. JVP 7-8 cm. Carotids 2+ bilat; no bruits. No  thyromegaly or nodule noted. Cor: PMI nondisplaced. Regular S1S2. No rubs, or murmurs. + S3.  Lungs: Clear, normal effort Abdomen: soft, NT, ND, no HSM. No bruits or masses. +BS  Extremities: no cyanosis, clubbing, rash. No peripheral edema Neuro: alert & orientedx3, cranial nerves grossly intact.    ASSESSMENT & PLAN:  1.Chronic Systolic Heart Failure: Nonischemic cardiomyopathy.  St Jude CRT-D device, not set to BiV pace b/c ineffective (baseline QRS complex is now narrow).  Nonischemic cardiomyopathy, EF 15-20% on 7/17 echo.  - NYHA class IIIb symptoms.  Continue Milrinone 0.25 mcg/kg/min.  Check Coox today.  - Continue torsemide 20 mg daily. BMET today.  - Continue digoxin 0.6325 mg daily.  Level stable earlier this month.  - Continue spironolactone 25 mg daily.  - Would not use Bidil, unable to tolerate Imdur due to headache.    - Pt is not a candidate for advanced therapies. He is aware that milrinone is his best, last option. 2. VT: Last shock 06/13/16. Off amiodarone due to hyperthyroidism.  Mexiletine was increased, no further  events.  3. Atrial fibrillation: Paroxysmal.  He is on Xarelto 15 daily (renally dosed). 4. Hyperthyroidism: Seeing endocrine, he is on methimazole and we've stopped amiodarone.    5. H/O PE: On coumadin.  6. Suspect OSA:  - Hasn't had sleep study.  Unlikely to improve his mortality at this point.  7. Depression: Depressed mood, [facility] and his overall poor prognosis.   Remains tenuous. Coox/BMET/CBC today. Will keep close MD follow up.  Overall, Mr Thad RangerReynolds has a very poor prognosis.  Suspect he likely has <6 months.  Would be appropriate to involve hospice, but patient has been resistant to this thus far, would also require cessation of milrinone for most Hospice companies.   Graciella FreerMichael Andrew Tillery, PA-C 07/13/2016  Total time spent > 25 minutes. Over half that spent discussing the above.

## 2016-07-15 ENCOUNTER — Encounter: Payer: Medicare Other | Admitting: Internal Medicine

## 2016-07-15 ENCOUNTER — Encounter: Payer: Self-pay | Admitting: Cardiology

## 2016-07-18 ENCOUNTER — Telehealth: Payer: Self-pay | Admitting: Internal Medicine

## 2016-07-18 NOTE — Telephone Encounter (Signed)
Daughter needs to have someone contact her to discuss his care.  Has questions about appts etc.

## 2016-07-19 ENCOUNTER — Telehealth (HOSPITAL_COMMUNITY): Payer: Self-pay | Admitting: Cardiology

## 2016-07-19 NOTE — Telephone Encounter (Signed)
Spoke with Dawn at [facility] facility - stated everything had been taken care of.  Patient had device check here in [place] office that patient refused to come to.  They have rescheduled him for [place] office & is being managed at nursing facility for any other symptoms.

## 2016-07-19 NOTE — Telephone Encounter (Signed)
Johnathan Morrison called to with concerns regarding severe increased SOB and increased weight  Weight today 163 (normally 159), weight has been increasing by 1 lb daily over the past week. Patient has been having a hard time breathing today, increased SOB  Per VO Amy Clegg,NP' Ok to take an additional tab of torsemide x 2 days ( torsemide 20 mg BID x 2 days)  Order faxed to [facility] @ [phone] attn Johnathan

## 2016-07-25 ENCOUNTER — Other Ambulatory Visit: Payer: Self-pay | Admitting: Internal Medicine

## 2016-07-25 ENCOUNTER — Telehealth (HOSPITAL_COMMUNITY): Payer: Self-pay | Admitting: *Deleted

## 2016-07-25 ENCOUNTER — Ambulatory Visit (HOSPITAL_COMMUNITY)
Admission: RE | Admit: 2016-07-25 | Discharge: 2016-07-25 | Disposition: A | Discharge status: Home / Self Care | Payer: No Typology Code available for payment source | Source: Ambulatory Visit | Attending: Cardiology | Admitting: Cardiology

## 2016-07-25 ENCOUNTER — Encounter (HOSPITAL_COMMUNITY): Payer: Self-pay

## 2016-07-25 VITALS — BP 95/71 | HR 104 | Wt 159.2 lb

## 2016-07-25 DIAGNOSIS — E0590 Thyrotoxicosis, unspecified without thyrotoxic crisis or storm: Secondary | ICD-10-CM | POA: Diagnosis not present

## 2016-07-25 DIAGNOSIS — Z86711 Personal history of pulmonary embolism: Secondary | ICD-10-CM | POA: Diagnosis not present

## 2016-07-25 DIAGNOSIS — Z95810 Presence of automatic (implantable) cardiac defibrillator: Secondary | ICD-10-CM | POA: Insufficient documentation

## 2016-07-25 DIAGNOSIS — Z888 Allergy status to other drugs, medicaments and biological substances status: Secondary | ICD-10-CM | POA: Insufficient documentation

## 2016-07-25 DIAGNOSIS — I509 Heart failure, unspecified: Secondary | ICD-10-CM

## 2016-07-25 DIAGNOSIS — Z8673 Personal history of transient ischemic attack (TIA), and cerebral infarction without residual deficits: Secondary | ICD-10-CM | POA: Insufficient documentation

## 2016-07-25 DIAGNOSIS — I2510 Atherosclerotic heart disease of native coronary artery without angina pectoris: Secondary | ICD-10-CM | POA: Diagnosis not present

## 2016-07-25 DIAGNOSIS — N183 Chronic kidney disease, stage 3 (moderate): Secondary | ICD-10-CM | POA: Insufficient documentation

## 2016-07-25 DIAGNOSIS — I472 Ventricular tachycardia, unspecified: Secondary | ICD-10-CM

## 2016-07-25 DIAGNOSIS — Z87891 Personal history of nicotine dependence: Secondary | ICD-10-CM | POA: Diagnosis not present

## 2016-07-25 DIAGNOSIS — Z7901 Long term (current) use of anticoagulants: Secondary | ICD-10-CM | POA: Insufficient documentation

## 2016-07-25 DIAGNOSIS — I480 Paroxysmal atrial fibrillation: Secondary | ICD-10-CM | POA: Diagnosis not present

## 2016-07-25 DIAGNOSIS — I428 Other cardiomyopathies: Secondary | ICD-10-CM | POA: Diagnosis not present

## 2016-07-25 DIAGNOSIS — G40909 Epilepsy, unspecified, not intractable, without status epilepticus: Secondary | ICD-10-CM | POA: Insufficient documentation

## 2016-07-25 DIAGNOSIS — Z79899 Other long term (current) drug therapy: Secondary | ICD-10-CM | POA: Insufficient documentation

## 2016-07-25 DIAGNOSIS — J449 Chronic obstructive pulmonary disease, unspecified: Secondary | ICD-10-CM | POA: Insufficient documentation

## 2016-07-25 DIAGNOSIS — F329 Major depressive disorder, single episode, unspecified: Secondary | ICD-10-CM | POA: Diagnosis not present

## 2016-07-25 DIAGNOSIS — R443 Hallucinations, unspecified: Secondary | ICD-10-CM | POA: Diagnosis not present

## 2016-07-25 DIAGNOSIS — I5022 Chronic systolic (congestive) heart failure: Secondary | ICD-10-CM | POA: Diagnosis not present

## 2016-07-25 LAB — DIGOXIN LEVEL: Digoxin Level: <0.2 ng/mL — ABNORMAL LOW (ref 0.8–2.0)

## 2016-07-25 LAB — COOXEMETRY PANEL
CARBOXYHEMOGLOBIN: 0.9 % (ref 0.5–1.5)
Methemoglobin: 0.8 % (ref 0.0–1.5)
O2 Saturation: 38.2 %
Total hemoglobin: 12.8 g/dL (ref 12.0–16.0)

## 2016-07-25 LAB — BASIC METABOLIC PANEL
Anion gap: 10 (ref 5–15)
BUN: 21 mg/dL — AB (ref 6–20)
CHLORIDE: 101 mmol/L (ref 101–111)
CO2: 25 mmol/L (ref 22–32)
Calcium: 8.8 mg/dL — ABNORMAL LOW (ref 8.9–10.3)
Creatinine, Ser: 1.23 mg/dL (ref 0.61–1.24)
GFR calc Af Amer: >60 mL/min (ref >60)
GFR calc non Af Amer: 59 mL/min — ABNORMAL LOW (ref >60)
GLUCOSE: 160 mg/dL — AB (ref 65–99)
POTASSIUM: 2.9 mmol/L — AB (ref 3.5–5.1)
SODIUM: 136 mmol/L (ref 135–145)

## 2016-07-25 LAB — CBC
HEMATOCRIT: 35.6 % — AB (ref 39.0–52.0)
HEMOGLOBIN: 11.8 g/dL — AB (ref 13.0–17.0)
MCH: 30.6 pg (ref 26.0–34.0)
MCHC: 33.1 g/dL (ref 30.0–36.0)
MCV: 92.5 fL (ref 78.0–100.0)
Platelets: 290 10*3/uL (ref 150–400)
RBC: 3.85 MIL/uL — ABNORMAL LOW (ref 4.22–5.81)
RDW: 14.2 % (ref 11.5–15.5)
WBC: 7.2 10*3/uL (ref 4.0–10.5)

## 2016-07-25 MED ORDER — POTASSIUM CHLORIDE CRYS ER 20 MEQ PO TBCR: 40.0000 meq | EXTENDED_RELEASE_TABLET | Freq: Every day | ORAL | 3 refills | Status: AC

## 2016-07-25 NOTE — Telephone Encounter (Signed)
Order to increase milrinone faxed to facility.

## 2016-07-25 NOTE — Progress Notes (Signed)
Patient ID: Johnathan Morrison, male   DOB: 11/27/1945, 70 y.o.   MRN: [id number]    Advanced Heart Failure Clinic Note    Primary Care: [facility] Primary Cardiologist: Dr. Diona Browner Primary HF: Dr. Shirlee Latch   HPI: [name] is a 70 y.o. 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  He was admitted to [facility] 02/23/16 with SOB and ?syncopal episode. ICD interrogation showed 2 shocks. Transferred to [facility] for HF and EP evaluation.  Echo with LVEF 15-20% range. Started on milrinone and weaned off prior to d/c. He was over-diuresed with CVP around 2 and received gentle IVF and diuretics adjusted.   He presented to ED on 03/03/16 with lightheadedness and received IV fluid and diuretics were held.  He had very close follow up scheduled but again reported to ED on 03/05/16 with chest pain, dyspnea, and poor appetite. Had repeat RHC that admission with marginal cardiac output.  He was being considered as a VAD but there were a concerns about lack of social support.  He was also noted to be hyperthyroid in the setting of amiodarone use. Amiodarone was stopped and he is now on mexiletine.  On 06/13/16, He went to the ER with another ICD discharge.  VT was noted.  EP was consulted and increased his mexiletine.   Admitted 07/01/16 with RHC showing low output as below. Torsemide decreased with CVP down to 1.  Coox remained marginal despite milrinone support.  Pt made a DNI but requested all else be done.  SNF had to be changed to one in [facility]) so that he could get milrinone infusion.   He remains profoundly fatigued.  He has lost another 4 lbs.  He is not walking much, very weak and hard to stand. Dyspnea with any walking.  No chest pain.  He has a very poor appetite.   Main complaint is lack of sleep and hallucinations at night that have been going on for 5 months.    ECG: NSR, PVCs, IVCD 156 msec  Labs (8/17): digoxin 0.7 Labs (10/17): K 3.8, creatinine 1.2  Labs (11/17): K 4 => 3.6, creatinine 1.19 => 1.24, BNP 760, digoxin 1.1, hgb 12.7, Co-ox 54%.  Labs (12/17): Co-ox 38%  PMH: 1. Chronic systolic CHF: Nonischemic cardiomyopathy.  End-stage, now on home milrinone.  - St Jude CRT-D => nonresponder, BiV pacing off.  - LHC/RHC (02/25/16): RA 9, PA 36/12, PCWP mean 13, CI 1.89.  Nonobstructive CAD.  - RHC (03/08/16): RA 2, PA 31/12, PCWP mean 7, CI 2.15 - Echo (7/17): Moderate LV dilation with EF 15-20%, severe diffuse hypokinesis, moderate to severe biatrial enlargement, moderately dilated RV with decreased systolic function, moderate MR.  - RHC (11/17): RA 4, PA 45/16 mean 31, mean PCWP 20, CI 1.54 2. Atrial fibrillation: Paroxysmal.  3. PE: 3/16, had IVC filter that was removed in 10/16.  4. CVA: 2/16.  5. CKD stage III.  6. H/o VT 7. Seizure disorder 8. COPD: Prior smoker.  9. H/o SAH 10. CAD: Nonobstructive on 7/17 cath.  11. 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   Current Outpatient Prescriptions  Medication Sig Dispense Refill  . acetaminophen (TYLENOL) 325 MG tablet Take 2 tablets (650 mg total) by mouth every 4 (four) hours as needed for headache or mild pain.    Marland Kitchen  atorvastatin (LIPITOR) 40 MG tablet Take 40 mg by mouth every morning.   0  . digoxin (LANOXIN) 0.125 MG tablet Take 0.5 tablets (0.0625 mg total) by mouth daily. 15 tablet 6  . guaiFENesin (MUCINEX) 600 MG 12 hr tablet Take 2 tablets (1,200 mg total) by mouth 2 (two) times daily. 120 tablet 6  . levETIRAcetam (KEPPRA) 500 MG tablet Take 500 mg by mouth daily.     Marland Kitchen LORazepam (ATIVAN) 0.5 MG tablet Take 0.5 mg by mouth every 8 (eight) hours as needed for anxiety. Reported on 02/23/2016    . meclizine (ANTIVERT) 25 MG tablet Take 25 mg by mouth 2 (two) times daily as  needed for dizziness.    . Melatonin 3 MG TABS Take 3 mg by mouth at bedtime.    . methimazole (TAPAZOLE) 10 MG tablet Take 1 tablet (10 mg total) by mouth 2 (two) times daily. (Patient taking differently: Take 10 mg by mouth 3 (three) times daily. ) 60 tablet 2  . mexiletine (MEXITIL) 200 MG capsule Take 1 capsule (200 mg total) by mouth 3 (three) times daily. 90 capsule 6  . milrinone (PRIMACOR) 20 MG/100 ML SOLN infusion Inject 18.475 mcg/min into the vein continuous. 100 mL 0  . omeprazole (PRILOSEC) 20 MG capsule Take 20 mg by mouth daily.    . polyethylene glycol (MIRALAX / GLYCOLAX) packet Take 17 g by mouth daily. 14 each 0  . Rivaroxaban (XARELTO) 15 MG TABS tablet Take 15 mg by mouth daily with supper.    . sertraline (ZOLOFT) 50 MG tablet Take 50 mg by mouth daily.    Marland Kitchen spironolactone (ALDACTONE) 25 MG tablet Take 25 mg by mouth daily.    . temazepam (RESTORIL) 7.5 MG capsule Take 7.5 mg by mouth at bedtime as needed for sleep.    Marland Kitchen torsemide (DEMADEX) 20 MG tablet Take 1 tablet (20 mg total) by mouth daily. 60 tablet 3  . traMADol (ULTRAM) 50 MG tablet Take 50 mg by mouth every 6 (six) hours as needed for moderate pain or severe pain.    Marland Kitchen albuterol (PROVENTIL HFA;VENTOLIN HFA) 108 (90 Base) MCG/ACT inhaler Inhale 2 puffs into the lungs every 6 (six) hours as needed for wheezing or shortness of breath. 1 Inhaler 0  . nitroGLYCERIN (NITROSTAT) 0.4 MG SL tablet Place 1 tablet (0.4 mg total) under the tongue every 5 (five) minutes as needed for chest pain. (Patient not taking: Reported on 07/25/2016) 30 tablet 0  . potassium chloride SA (K-DUR,KLOR-CON) 20 MEQ tablet Take 2 tablets (40 mEq total) by mouth daily. 60 tablet 3   No current facility-administered medications for this encounter.     Allergies  Allergen Reactions  . 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 Marital status: Single     Spouse name: N/A  . Number of children: N/A  . Years of education: N/A   Occupational History  . Not on file.   Social History Main Topics  . Smoking status: Former Games developer  . Smokeless tobacco: Never Used     Comment: smoked 1.5 ppd x 30 years, quit in 2012)  . Alcohol use No  . Drug use: No  . Sexual activity: Not on file   Other Topics Concern  . Not on file   Social History Narrative  . No narrative on file      Family History  Problem Relation Age of Onset  . Cardiomyopathy    . Sudden death      Vitals:   07/25/16 1142  BP: 95/71  Pulse: (!) 104  SpO2: 95%  Weight: 159 lb 4 oz (72.2 kg)   Wt Readings from Last 3 Encounters:  07/25/16 159 lb 4 oz (72.2 kg)  07/13/16 163 lb 9.6 oz (74.2 kg)  07/04/16 153 lb 10.6 oz (69.7 kg)    Physical Exam: General: Chronically ill and fatigued appearing. Thin.  HEENT: Normal Neck: supple. JVP 7 cm. Carotids 2+ bilat; no bruits. No  thyromegaly or nodule noted. Cor: PMI nondisplaced. Regular S1S2. No rubs, or murmurs.  No S3.   Lungs: Clear, normal effort Abdomen: soft, NT, ND, no HSM. No bruits or masses. +BS  Extremities: no cyanosis, clubbing, rash. No peripheral edema Neuro: alert & orientedx3, cranial nerves grossly intact.    ASSESSMENT & PLAN: 1.Chronic systolic CHF: Nonischemic cardiomyopathy.  St Jude CRT-D device, not set to BiV pace b/c ineffective.  EF 15-20% on 7/17 echo. End stage CHF on home milrinone.  He is not volume overloaded on exam.  Profound fatigue is explained by low output, as evidenced by co-ox 38% today despite milrinone 0.25.  - We increased milrinone to 0.375 mcg/kg/min today in clinic.  He will need to come back early next week.  - Continue torsemide 20 mg daily. BMET today.  - Continue digoxin 0.0625 mg daily.  Check level today.  - Continue spironolactone 25 mg daily.  - Would not use Bidil, unable to tolerate Imdur due to headache.    - Pt is not a candidate for advanced therapies due  to lack of social support.  [facility]. He is aware that milrinone is only going to be palliative. 2. VT: Last shock 06/13/16. Off amiodarone due to hyperthyroidism.  Mexiletine was increased, no further events.  3. Atrial fibrillation: Paroxysmal.  He is on Xarelto 15 daily (renally dosed).  He is in NSR today.  4. Hyperthyroidism: Seeing endocrine, he is on methimazole and we've stopped amiodarone.    5. H/O PE: On Xarelto.  6. Suspect OSA: Hasn't had sleep study.  Unlikely to improve his mortality at this point.  7. Depression: Depressed mood, very [facility] and his overall poor prognosis.  8. Hallucinations: Tend to occur at night.  ?Related to low output.  As above, increasing milrinone.  Will also check digoxin level.  They are significantly limiting his sleep.  I will make a neurology referral for him.   Very tenuous situation.  I do not think re-admission to the hospital is going to help him, will try to manage by titrating up milrinone.  Overall, Mr Thad RangerReynolds has a very poor prognosis.  Suspect he has <6 months.  Would be appropriate to involve hospice, but patient has been resistant to this thus far, would also require cessation of milrinone for most Hospice companies.   [name] Annaston Upham,  07/25/2016

## 2016-07-25 NOTE — Patient Instructions (Signed)
Routine lab work today. Will notify you of abnormal results, otherwise no news is good news!  Will refer you to [facility] for hallucinations. Address: [address], [place], [place] [address] Hours:  7:30AM-4:30PM Phone: [phone]  Follow up next week with Amy Clegg NP-C.  Do the following things EVERYDAY: 1) Weigh yourself in the morning before breakfast. Write it down and keep it in a log. 2) Take your medicines as prescribed 3) Eat low salt foods-Limit salt (sodium) to 2000 mg per day.  4) Stay as active as you can everyday 5) Limit all fluids for the day to less than 2 liters

## 2016-07-30 ENCOUNTER — Encounter (HOSPITAL_COMMUNITY): Payer: Self-pay

## 2016-07-30 ENCOUNTER — Inpatient Hospital Stay (HOSPITAL_COMMUNITY)
Admission: EM | Admit: 2016-07-30 | Discharge: 2016-08-15 | DRG: 292 | Disposition: E | Payer: Medicare Other | Attending: Cardiovascular Disease | Admitting: Cardiovascular Disease

## 2016-07-30 ENCOUNTER — Emergency Department (HOSPITAL_COMMUNITY): Payer: Medicare Other

## 2016-07-30 DIAGNOSIS — Z9861 Coronary angioplasty status: Secondary | ICD-10-CM

## 2016-07-30 DIAGNOSIS — R410 Disorientation, unspecified: Secondary | ICD-10-CM | POA: Diagnosis present

## 2016-07-30 DIAGNOSIS — F329 Major depressive disorder, single episode, unspecified: Secondary | ICD-10-CM | POA: Diagnosis present

## 2016-07-30 DIAGNOSIS — R442 Other hallucinations: Secondary | ICD-10-CM | POA: Diagnosis present

## 2016-07-30 DIAGNOSIS — I428 Other cardiomyopathies: Secondary | ICD-10-CM | POA: Diagnosis present

## 2016-07-30 DIAGNOSIS — Z95810 Presence of automatic (implantable) cardiac defibrillator: Secondary | ICD-10-CM

## 2016-07-30 DIAGNOSIS — Z681 Body mass index (BMI) 19 or less, adult: Secondary | ICD-10-CM | POA: Diagnosis not present

## 2016-07-30 DIAGNOSIS — I272 Pulmonary hypertension, unspecified: Secondary | ICD-10-CM | POA: Diagnosis present

## 2016-07-30 DIAGNOSIS — Z8673 Personal history of transient ischemic attack (TIA), and cerebral infarction without residual deficits: Secondary | ICD-10-CM

## 2016-07-30 DIAGNOSIS — I5043 Acute on chronic combined systolic (congestive) and diastolic (congestive) heart failure: Secondary | ICD-10-CM | POA: Diagnosis present

## 2016-07-30 DIAGNOSIS — Z7901 Long term (current) use of anticoagulants: Secondary | ICD-10-CM

## 2016-07-30 DIAGNOSIS — I472 Ventricular tachycardia: Secondary | ICD-10-CM

## 2016-07-30 DIAGNOSIS — R627 Adult failure to thrive: Secondary | ICD-10-CM | POA: Diagnosis present

## 2016-07-30 DIAGNOSIS — Z96653 Presence of artificial knee joint, bilateral: Secondary | ICD-10-CM | POA: Diagnosis present

## 2016-07-30 DIAGNOSIS — Z87891 Personal history of nicotine dependence: Secondary | ICD-10-CM

## 2016-07-30 DIAGNOSIS — Z66 Do not resuscitate: Secondary | ICD-10-CM | POA: Diagnosis not present

## 2016-07-30 DIAGNOSIS — I2510 Atherosclerotic heart disease of native coronary artery without angina pectoris: Secondary | ICD-10-CM | POA: Diagnosis present

## 2016-07-30 DIAGNOSIS — I130 Hypertensive heart and chronic kidney disease with heart failure and stage 1 through stage 4 chronic kidney disease, or unspecified chronic kidney disease: Secondary | ICD-10-CM | POA: Diagnosis present

## 2016-07-30 DIAGNOSIS — R569 Unspecified convulsions: Secondary | ICD-10-CM | POA: Diagnosis present

## 2016-07-30 DIAGNOSIS — J449 Chronic obstructive pulmonary disease, unspecified: Secondary | ICD-10-CM | POA: Diagnosis present

## 2016-07-30 DIAGNOSIS — F419 Anxiety disorder, unspecified: Secondary | ICD-10-CM | POA: Diagnosis present

## 2016-07-30 DIAGNOSIS — I5084 End stage heart failure: Secondary | ICD-10-CM | POA: Diagnosis present

## 2016-07-30 DIAGNOSIS — Z888 Allergy status to other drugs, medicaments and biological substances status: Secondary | ICD-10-CM

## 2016-07-30 DIAGNOSIS — I509 Heart failure, unspecified: Secondary | ICD-10-CM

## 2016-07-30 DIAGNOSIS — Z515 Encounter for palliative care: Secondary | ICD-10-CM | POA: Diagnosis not present

## 2016-07-30 DIAGNOSIS — Z86711 Personal history of pulmonary embolism: Secondary | ICD-10-CM | POA: Diagnosis not present

## 2016-07-30 DIAGNOSIS — I480 Paroxysmal atrial fibrillation: Secondary | ICD-10-CM

## 2016-07-30 DIAGNOSIS — E0590 Thyrotoxicosis, unspecified without thyrotoxic crisis or storm: Secondary | ICD-10-CM | POA: Diagnosis present

## 2016-07-30 DIAGNOSIS — I5022 Chronic systolic (congestive) heart failure: Secondary | ICD-10-CM

## 2016-07-30 DIAGNOSIS — N183 Chronic kidney disease, stage 3 (moderate): Secondary | ICD-10-CM | POA: Diagnosis present

## 2016-07-30 DIAGNOSIS — R0602 Shortness of breath: Secondary | ICD-10-CM | POA: Diagnosis present

## 2016-07-30 DIAGNOSIS — Z79899 Other long term (current) drug therapy: Secondary | ICD-10-CM

## 2016-07-30 DIAGNOSIS — E876 Hypokalemia: Secondary | ICD-10-CM

## 2016-07-30 LAB — BASIC METABOLIC PANEL
Anion gap: 13 (ref 5–15)
BUN: 22 mg/dL — AB (ref 6–20)
CO2: 21 mmol/L — ABNORMAL LOW (ref 22–32)
Calcium: 9 mg/dL (ref 8.9–10.3)
Chloride: 104 mmol/L (ref 101–111)
Creatinine, Ser: 1.31 mg/dL — ABNORMAL HIGH (ref 0.61–1.24)
GFR calc Af Amer: >60 mL/min (ref >60)
GFR, EST NON AFRICAN AMERICAN: 54 mL/min — AB (ref >60)
GLUCOSE: 103 mg/dL — AB (ref 65–99)
POTASSIUM: 3.3 mmol/L — AB (ref 3.5–5.1)
Sodium: 138 mmol/L (ref 135–145)

## 2016-07-30 LAB — TROPONIN I: Troponin I: 0.04 ng/mL (ref <0.03)

## 2016-07-30 LAB — CBC
HEMATOCRIT: 36.8 % — AB (ref 39.0–52.0)
Hemoglobin: 12.4 g/dL — ABNORMAL LOW (ref 13.0–17.0)
MCH: 30.9 pg (ref 26.0–34.0)
MCHC: 33.7 g/dL (ref 30.0–36.0)
MCV: 91.8 fL (ref 78.0–100.0)
Platelets: 360 10*3/uL (ref 150–400)
RBC: 4.01 MIL/uL — ABNORMAL LOW (ref 4.22–5.81)
RDW: 14.4 % (ref 11.5–15.5)
WBC: 8.2 10*3/uL (ref 4.0–10.5)

## 2016-07-30 LAB — DIGOXIN LEVEL: DIGOXIN LVL: 0.3 ng/mL — AB (ref 0.8–2.0)

## 2016-07-30 MED ORDER — MILRINONE LACTATE IN DEXTROSE 20-5 MG/100ML-% IV SOLN
0.3750 ug/kg/min | INTRAVENOUS | Status: DC
  Administered 2016-07-30 – 2016-07-31 (×2): 0.375 ug/kg/min via INTRAVENOUS
  Filled 2016-07-30 (×2): qty 100

## 2016-07-30 MED ORDER — IPRATROPIUM-ALBUTEROL 0.5-2.5 (3) MG/3ML IN SOLN
3.0000 mL | RESPIRATORY_TRACT | Status: DC | PRN
Start: 2016-07-30 — End: 2016-08-01

## 2016-07-30 MED ORDER — POTASSIUM CHLORIDE CRYS ER 20 MEQ PO TBCR
40.0000 meq | EXTENDED_RELEASE_TABLET | Freq: Once | ORAL | Status: AC
  Administered 2016-07-30: 40 meq via ORAL

## 2016-07-30 MED ORDER — ORAL CARE MOUTH RINSE
15.0000 mL | Freq: Two times a day (BID) | OROMUCOSAL | Status: DC
  Administered 2016-07-30 – 2016-07-31 (×2): 15 mL via OROMUCOSAL

## 2016-07-30 MED ORDER — GUAIFENESIN ER 600 MG PO TB12: 1200.0000 mg | ORAL_TABLET | Freq: Two times a day (BID) | ORAL | Status: DC

## 2016-07-30 MED ORDER — SODIUM CHLORIDE 0.9% FLUSH
10.0000 mL | Freq: Two times a day (BID) | INTRAVENOUS | Status: DC
  Administered 2016-07-30 – 2016-07-31 (×2): 10 mL

## 2016-07-30 MED ORDER — ACETAMINOPHEN 325 MG PO TABS: 650.0000 mg | ORAL_TABLET | ORAL | Status: DC | PRN

## 2016-07-30 MED ORDER — RIVAROXABAN 15 MG PO TABS
15.0000 mg | ORAL_TABLET | Freq: Every day | ORAL | Status: DC
  Filled 2016-07-30: qty 1

## 2016-07-30 MED ORDER — SENNA 8.6 MG PO TABS: 1.0000 | ORAL_TABLET | ORAL | Status: DC | PRN

## 2016-07-30 MED ORDER — MILRINONE LACTATE IN DEXTROSE 20-5 MG/100ML-% IV SOLN: 0.2500 ug/kg/min | INTRAVENOUS | Status: DC

## 2016-07-30 MED ORDER — MECLIZINE HCL 25 MG PO TABS
25.0000 mg | ORAL_TABLET | Freq: Two times a day (BID) | ORAL | Status: DC | PRN
  Filled 2016-07-30: qty 1

## 2016-07-30 MED ORDER — ALBUTEROL SULFATE HFA 108 (90 BASE) MCG/ACT IN AERS: 2.0000 | INHALATION_SPRAY | Freq: Four times a day (QID) | RESPIRATORY_TRACT | Status: DC | PRN

## 2016-07-30 MED ORDER — LORAZEPAM 0.5 MG PO TABS: 0.5000 mg | ORAL_TABLET | Freq: Three times a day (TID) | ORAL | Status: DC | PRN

## 2016-07-30 MED ORDER — TEMAZEPAM 7.5 MG PO CAPS: 7.5000 mg | ORAL_CAPSULE | Freq: Every evening | ORAL | Status: DC | PRN

## 2016-07-30 MED ORDER — METHIMAZOLE 10 MG PO TABS
10.0000 mg | ORAL_TABLET | Freq: Two times a day (BID) | ORAL | Status: DC
  Filled 2016-07-30 (×4): qty 1

## 2016-07-30 MED ORDER — SODIUM CHLORIDE 0.9% FLUSH
3.0000 mL | Freq: Two times a day (BID) | INTRAVENOUS | Status: DC
Start: 2016-07-30 — End: 2016-08-01
  Administered 2016-07-30 – 2016-07-31 (×2): 3 mL via INTRAVENOUS

## 2016-07-30 MED ORDER — MORPHINE SULFATE (PF) 2 MG/ML IV SOLN
2.0000 mg | INTRAVENOUS | Status: DC | PRN
  Administered 2016-07-31 (×2): 2 mg via INTRAVENOUS
  Filled 2016-07-30 (×2): qty 1

## 2016-07-30 MED ORDER — SPIRONOLACTONE 25 MG PO TABS: 25.0000 mg | ORAL_TABLET | Freq: Every day | ORAL | Status: DC

## 2016-07-30 MED ORDER — ALBUTEROL SULFATE (2.5 MG/3ML) 0.083% IN NEBU: 2.5000 mg | INHALATION_SOLUTION | Freq: Four times a day (QID) | RESPIRATORY_TRACT | Status: DC | PRN

## 2016-07-30 MED ORDER — LORAZEPAM 2 MG/ML IJ SOLN
1.0000 mg | INTRAMUSCULAR | Status: DC | PRN
  Administered 2016-07-31 (×3): 1 mg via INTRAVENOUS
  Filled 2016-07-30 (×3): qty 1

## 2016-07-30 MED ORDER — MELATONIN 3 MG PO TABS
3.0000 mg | ORAL_TABLET | Freq: Every day | ORAL | Status: DC
  Filled 2016-07-30 (×2): qty 1

## 2016-07-30 MED ORDER — SODIUM CHLORIDE 0.9 % IV SOLN: INTRAVENOUS | Status: DC

## 2016-07-30 MED ORDER — DIGOXIN 125 MCG PO TABS: 0.0625 mg | ORAL_TABLET | Freq: Every day | ORAL | Status: DC

## 2016-07-30 MED ORDER — SODIUM CHLORIDE 0.9% FLUSH
10.0000 mL | INTRAVENOUS | Status: DC | PRN
  Administered 2016-07-31 (×2): 10 mL
  Administered 2016-08-01: 20 mL
  Filled 2016-07-30 (×3): qty 40

## 2016-07-30 MED ORDER — MEXILETINE HCL 200 MG PO CAPS: 200.0000 mg | ORAL_CAPSULE | Freq: Three times a day (TID) | ORAL | Status: DC

## 2016-07-30 MED ORDER — NITROGLYCERIN 0.4 MG SL SUBL: 0.4000 mg | SUBLINGUAL_TABLET | SUBLINGUAL | Status: DC | PRN

## 2016-07-30 MED ORDER — ONDANSETRON HCL 4 MG/2ML IJ SOLN: 4.0000 mg | Freq: Four times a day (QID) | INTRAMUSCULAR | Status: DC | PRN

## 2016-07-30 MED ORDER — LORAZEPAM 2 MG/ML IJ SOLN
1.0000 mg | INTRAMUSCULAR | Status: DC | PRN
  Administered 2016-07-30 (×2): 1 mg via INTRAVENOUS
  Filled 2016-07-30 (×2): qty 1

## 2016-07-30 MED ORDER — FUROSEMIDE 10 MG/ML IJ SOLN
40.0000 mg | Freq: Once | INTRAMUSCULAR | Status: AC
Start: 2016-07-30 — End: 2016-07-30
  Administered 2016-07-30: 40 mg via INTRAVENOUS
  Filled 2016-07-30: qty 4

## 2016-07-30 MED ORDER — SODIUM CHLORIDE 0.9 % IV SOLN: 250.0000 mL | INTRAVENOUS | Status: DC | PRN

## 2016-07-30 MED ORDER — TORSEMIDE 20 MG PO TABS: 20.0000 mg | ORAL_TABLET | Freq: Every day | ORAL | Status: DC

## 2016-07-30 MED ORDER — SODIUM CHLORIDE 0.9% FLUSH: 3.0000 mL | INTRAVENOUS | Status: DC | PRN

## 2016-07-30 MED ORDER — GLYCOPYRROLATE 0.2 MG/ML IJ SOLN: 0.4000 mg | INTRAMUSCULAR | Status: DC | PRN

## 2016-07-30 MED ORDER — LEVETIRACETAM 500 MG PO TABS: 500.0000 mg | ORAL_TABLET | Freq: Every day | ORAL | Status: DC

## 2016-07-30 MED ORDER — MORPHINE SULFATE (PF) 2 MG/ML IV SOLN
1.0000 mg | INTRAVENOUS | Status: DC | PRN
  Administered 2016-07-30 (×2): 1 mg via INTRAVENOUS
  Filled 2016-07-30 (×2): qty 1

## 2016-07-30 MED ORDER — PANTOPRAZOLE SODIUM 40 MG PO TBEC: 40.0000 mg | DELAYED_RELEASE_TABLET | Freq: Every day | ORAL | Status: DC

## 2016-07-30 MED ORDER — SERTRALINE HCL 50 MG PO TABS: 75.0000 mg | ORAL_TABLET | Freq: Every day | ORAL | Status: DC

## 2016-07-30 MED ORDER — POTASSIUM CHLORIDE CRYS ER 20 MEQ PO TBCR: 40.0000 meq | EXTENDED_RELEASE_TABLET | Freq: Every day | ORAL | Status: DC

## 2016-07-30 MED ORDER — TRAMADOL HCL 50 MG PO TABS: 50.0000 mg | ORAL_TABLET | Freq: Four times a day (QID) | ORAL | Status: DC | PRN

## 2016-07-30 NOTE — ED Notes (Signed)
Pt. Given drink with cardiology approval.

## 2016-07-30 NOTE — Progress Notes (Addendum)
Patient's PICC will not draw back blood for lab draws.  Cardiology and Palliative MD's aware and IV team notified. IV team notified & instructed to re-consult IV team if tPa needed for lab draws.  Johnathan Morrison, Johnathan Morrison

## 2016-07-30 NOTE — ED Notes (Signed)
Delay in transport-cardiology to put in milronone inpatient orders and pt. To be transitioned off home pump. Also, palliative care consulted.

## 2016-07-30 NOTE — Progress Notes (Signed)
Patient arrived from ED to 4N32.  Patient with dyspnea at rest, dusky in color, respiratory rate 40, and alert and oriented.  Patient verbalized to RN that he does not want CPR in the event of his cardiac arrest.  Cardiology PA & MD notified of patient's change in condition and wishes. Palliative MD notified of patient's change in condition.  Family notified and at bedside.  Milrinone infusion switched to hospital pump & patient's milrinone pump turned off and given to daughter, Ottie Glazier.  Will continue to assess and offer support to patient and family. [place], Mitzi Hansen

## 2016-07-30 NOTE — Care Management Note (Addendum)
Case Management Note  Patient Details  Name: Johnathan Morrison MRN: [id number] Date of Birth: 1946/02/05  Subjective/Objective:   70 y.o. M seen in the ED from SNF. No CM interventions required at present. Will be available for additional disposition needs.                  Action/Plan: Return to [facility] when Medically Stable.    Expected Discharge Date:                  Expected Discharge Plan:     In-House Referral:     Discharge planning Services     Post Acute Care Choice:    Choice offered to:     DME Arranged:    DME Agency:     HH Arranged:    HH Agency:     Status of Service:     If discussed at [employer], dates discussed:    Additional Comments:  Yvone Neu, RN 08/03/2016, 9:10 AM

## 2016-07-30 NOTE — Consult Note (Signed)
Consultation Note Date: 2016/08/25   Patient Name: Johnathan Morrison  DOB: [birth year]  MRN: [id number]  Age / Sex: 70 y.o., male  PCP: [facility] Referring Physician: Vesta Mixer, MD  Reason for Consultation: Terminal Care  HPI/Patient Profile: 70 y.o. male    admitted on Aug 25, 2016     Clinical Assessment and Goals of Care:  70 yo gentleman, known to palliative service from previous hospitalization, history of chronic systolic CHF non ischemic cardiomyopathy, EF 15-20%, ICD, hyperthyroidism, history of PE, recent ongoing functional decline, worsening mental status. Patient came from [facility] with worsening CHF. Palliative care re consulted.   Paged by bedside RN shortly after patient's arrival from ED to floor. Patient with cheyne stokes respirations, with periods of tachypnea and apnea, low BPs, worsening mentation. Patient appears with end of life signs and symptoms. Call placed and daughter Ottie Glazier arrived at the bedside. I introduced myself and palliative care as follows: Palliative medicine is specialized medical care for people living with serious illness. It focuses on providing relief from the symptoms and stress of a serious illness. The goal is to improve quality of life for both the patient and the family.  Patient confused, he is not able to answer questions appropriately, he is in mild to moderate distress. Patient's condition discussed in detail with daughter Ottie Glazier. She is appreciative of the information she has received from Dr Elease Hashimoto from cardiology. Discussed with daughter in detail about the patient's current condition, end of life signs and symptoms discussed in detail. See recommendations below, thank you for the consult.   NEXT OF KIN  daughter Yidel Garbo [phone]    SUMMARY OF RECOMMENDATIONS    DNR DNI Comfort care PRN IV Moprhine,  Ativan and Robinul Magnet for ICD de activation once son arrives Anticipate hospital death Comfort cart for family Chaplain consult  Code Status/Advance Care Planning:  DNR    Symptom Management:     As above  Palliative Prophylaxis:   Delirium Protocol  Additional Recommendations (Limitations, Scope, Preferences):  Full Comfort Care  Psycho-social/Spiritual:   Desire for further Chaplaincy support:yes  Additional Recommendations: Education on Hospice  Prognosis:   Hours - Days  Discharge Planning: Anticipated Hospital Death      Primary Diagnoses: Present on Admission: . Acute on chronic combined systolic and diastolic CHF (congestive heart failure) (HCC)   I have reviewed the medical record, interviewed the patient and family, and examined the patient. The following aspects are pertinent.  Past Medical History:  Diagnosis Date  . Atrial fibrillation (HCC)   . Cardiomyopathy (HCC)   . Chronic renal disease, stage III   . Chronic systolic heart failure (HCC)   . COPD (chronic obstructive pulmonary disease) (HCC)   . 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 Diastolic dysfunction    Grade 3  . Essential hypertension   . PE (pulmonary embolism) March 2016  . Pulmonary HTN    Rt heart cath June 2016  . SAH (subarachnoid hemorrhage) (HCC) Dec 2015  .  Seizures [facility])    Social History   Social History  . Marital status: Single    Spouse name: N/A  . Number of children: N/A  . Years of education: N/A   Social History Main Topics  . Smoking status: Former Games developer  . Smokeless tobacco: Never Used     Comment: smoked 1.5 ppd x 30 years, quit in 2012)  . Alcohol use No  . Drug use: No  . Sexual activity: Not Asked   Other Topics Concern  . None   Social History Narrative  . None   Family History  Problem Relation Age of Onset  . Cardiomyopathy    . Sudden death     Scheduled Meds: . digoxin  0.0625 mg Oral Daily  . guaiFENesin   1,200 mg Oral BID  . levETIRAcetam  500 mg Oral Daily  . Melatonin  3 mg Oral QHS  . methimazole  10 mg Oral BID  . mexiletine  200 mg Oral TID  . [START ON 07/31/2016] pantoprazole  40 mg Oral Daily  . potassium chloride SA  40 mEq Oral Daily  . Rivaroxaban  15 mg Oral Q supper  . sertraline  75 mg Oral Daily  . sodium chloride flush  10-40 mL Intracatheter Q12H  . sodium chloride flush  3 mL Intravenous Q12H  . [START ON 07/31/2016] spironolactone  25 mg Oral Daily  . [START ON 07/31/2016] torsemide  20 mg Oral Daily   Continuous Infusions: . sodium chloride    . milrinone 0.375 mcg/kg/min (08-15-16 1519)   PRN Meds:.sodium chloride, acetaminophen, albuterol, glycopyrrolate, ipratropium-albuterol, LORazepam, meclizine, morphine injection, nitroGLYCERIN, ondansetron (ZOFRAN) IV, senna, sodium chloride flush, sodium chloride flush, traMADol Medications Prior to Admission:  Prior to Admission medications   Medication Sig Start Date End Date Taking? Authorizing Provider  acetaminophen (TYLENOL) 325 MG tablet Take 2 tablets (650 mg total) by mouth every 4 (four) hours as needed for headache or mild pain. 03/10/16  Yes Renae Fickle, MD  albuterol (PROVENTIL HFA;VENTOLIN HFA) 108 (90 Base) MCG/ACT inhaler Inhale 2 puffs into the lungs every 6 (six) hours as needed for wheezing or shortness of breath. 02/12/16  Yes [name], MD  digoxin (LANOXIN) 0.125 MG tablet Take 0.5 tablets (0.0625 mg total) by mouth daily. 06/29/16  Yes Laurey Morale, MD  guaiFENesin (MUCINEX) 600 MG 12 hr tablet Take 2 tablets (1,200 mg total) by mouth 2 (two) times daily. 03/25/16  Yes Bevelyn Buckles Bensimhon, MD  ipratropium-albuterol (DUONEB) 0.5-2.5 (3) MG/3ML SOLN Take 3 mLs by nebulization every 4 (four) hours as needed (wheezing).   Yes Historical Provider, MD  levETIRAcetam (KEPPRA) 500 MG tablet Take 500 mg by mouth daily.    Yes Historical Provider, MD  LORazepam (ATIVAN) 0.5 MG tablet Take 0.5 mg by mouth  every 8 (eight) hours as needed for anxiety. Reported on 02/23/2016   Yes Historical Provider, MD  meclizine (ANTIVERT) 25 MG tablet Take 25 mg by mouth 2 (two) times daily as needed for dizziness.   Yes Historical Provider, MD  Melatonin 3 MG TABS Take 3 mg by mouth at bedtime.   Yes Historical Provider, MD  methimazole (TAPAZOLE) 10 MG tablet Take 1 tablet (10 mg total) by mouth 2 (two) times daily. 06/23/16  Yes Carlus Pavlov, MD  mexiletine (MEXITIL) 200 MG capsule Take 1 capsule (200 mg total) by mouth 3 (three) times daily. 06/13/16  Yes Antoine Poche, MD  milrinone (PRIMACOR) 20 MG/100 ML SOLN infusion Inject  18.475 mcg/min into the vein continuous. 07/04/16  Yes Graciella FreerMichael Andrew Tillery, PA-C  nitroGLYCERIN (NITROSTAT) 0.4 MG SL tablet Place 1 tablet (0.4 mg total) under the tongue every 5 (five) minutes as needed for chest pain. 02/03/16  Yes Devoria AlbeIva Knapp, MD  Nutritional Supplements (NUTRITIONAL DRINK PO) Take 120 mLs by mouth daily. Med pass 2.0   Yes Historical Provider, MD  omeprazole (PRILOSEC) 20 MG capsule Take 20 mg by mouth daily.   Yes Historical Provider, MD  polyethylene glycol (MIRALAX / GLYCOLAX) packet Take 17 g by mouth daily. 03/10/16  Yes Renae FickleMackenzie Short, MD  potassium chloride SA (K-DUR,KLOR-CON) 20 MEQ tablet Take 2 tablets (40 mEq total) by mouth daily. 07/25/16  Yes Laurey Moralealton S McLean, MD  Rivaroxaban (XARELTO) 15 MG TABS tablet Take 15 mg by mouth daily with supper.   Yes Historical Provider, MD  senna (SENOKOT) 8.6 MG tablet Take 1 tablet by mouth every 4 (four) hours.   Yes Historical Provider, MD  sertraline (ZOLOFT) 50 MG tablet Take 75 mg by mouth daily.    Yes Historical Provider, MD  spironolactone (ALDACTONE) 25 MG tablet Take 25 mg by mouth daily.   Yes Historical Provider, MD  temazepam (RESTORIL) 7.5 MG capsule Take 7.5 mg by mouth at bedtime as needed for sleep.   Yes Historical Provider, MD  torsemide (DEMADEX) 20 MG tablet Take 1 tablet (20 mg total) by mouth  daily. 07/05/16  Yes Graciella FreerMichael Andrew Tillery, PA-C  traMADol (ULTRAM) 50 MG tablet Take 50 mg by mouth every 6 (six) hours as needed for moderate pain or severe pain.    Historical Provider, MD   Allergies  Allergen Reactions  . 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   Review of Systems + for confusion pain dyspnea  Physical Exam Chronically ill appearing Much more weak debilitated than previous admission Shallow breathing, apnea spells evident Thin extremities S1 S2 Abdomen soft No edema  Vital Signs: BP 96/73   Pulse 98   Temp 97.5 F (36.4 C) (Axillary)   Resp (!) 51   Ht 6\' 3"  (1.905 m)   Wt 69 kg (152 lb 1.9 oz)   SpO2 95%   BMI 19.01 kg/m  Pain Assessment: No/denies pain   Pain Score: 0-No pain   SpO2: SpO2: 95 % O2 Device:SpO2: 95 % O2 Flow Rate: .O2 Flow Rate (L/min): 3 L/min  IO: Intake/output summary:  Intake/Output Summary (Last 24 hours) at 05-31-16 1549 Last data filed at 05-31-16 1149  Gross per 24 hour  Intake                0 ml  Output              250 ml  Net             -250 ml    LBM:   Baseline Weight: Weight: 72.1 kg (159 lb) Most recent weight: Weight: 69 kg (152 lb 1.9 oz)     Palliative Assessment/Data:   Flowsheet Rows   Flowsheet Row Most Recent Value  Intake Tab  Referral Department  Cardiology  Unit at Time of Referral  Cardiac/Telemetry Unit  Palliative Care Primary Diagnosis  Cardiac  Palliative Care Type  Return patient Palliative Care  Reason for referral  Non-pain Symptom, End of Life Care Assistance  Clinical Assessment  Palliative Performance Scale Score  30%  Pain Max last 24 hours  4  Pain Min Last 24 hours  3  Dyspnea Max Last 24 Hours  4  Dyspnea Min Last 24 hours  3  Nausea Max Last 24 Hours  4  Nausea Min Last 24 Hours  3  Anxiety Max Last 24 Hours  4  Anxiety Min Last 24 Hours  3  Psychosocial & Spiritual Assessment  Palliative Care Outcomes  Patient/Family  meeting held?  Yes  Who was at the meeting?  patient daughter daughter's mother   Palliative Care Outcomes  Clarified goals of care      Time In:  1450 Time Out:  1550 Time Total:  60 Greater than 50%  of this time was spent counseling and coordinating care related to the above assessment and plan.  Signed by: Rosalin Hawking, MD  [phone]  Please contact Palliative Medicine Team phone at [phone] for questions and concerns.  For individual provider: See Loretha Stapler

## 2016-07-30 NOTE — ED Notes (Signed)
Cardiology at bedside.

## 2016-07-30 NOTE — H&P (Signed)
Patient ID: Johnathan Morrison MRN: [id number], DOB/AGE: [birth year]   Admit date: 08-25-16   Primary Physician: [facility] Primary Cardiologist: Dr. Diona Browner Primary HF: Dr. Shirlee Latch  Reason for admission: CHF  Pt. Profile:  Johnathan Morrison is a 70 y.o. 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   He was admitted to [facility] 02/23/16 with SOB and ?syncopal episode. ICD interrogation showed 2 shocks. Transferred to [facility] for HF and EP evaluation.  Echo with LVEF 15-20% range. Started on milrinone and weaned off prior to d/c. He was over-diuresed with CVP around 2 and received gentle IVF and diuretics adjusted.   He presented to ED on 03/03/16 with lightheadedness and received IV fluid and diuretics were held.  He had very close follow up scheduled but again reported to ED on 03/05/16 with chest pain, dyspnea, and poor appetite. Had repeat RHC that admission with marginal cardiac output.  He was being considered as a VAD but there were a concerns about lack of social support.  He was also noted to be hyperthyroid in the setting of amiodarone use. Amiodarone was stopped and he is now on mexiletine.  On 06/13/16, He went to the ER with another ICD discharge.  VT was noted.  EP was consulted and increased his mexiletine.   Admitted 07/01/16 with RHC showing low output as below. Torsemide decreased with CVP down to 1. Coox remained marginal despite milrinone support.  Pt made a DNI but requested all else be done.  SNF had to be changed to one in [facility]) so that he could get milrinone infusion.   Seen by Dr. Shirlee Latch on 07/25/16 in the office. He felt profoundly fatigued and hallucinations at night. Dr. Shirlee Latch  felt like he should consider hospice but he was reluctant. They tried to up titrate milrinone and referral to neurology for hallucinations.   He presented to [facility] today from nursing home with worsening SOB. He feels worse since being on the Milrinone. Has no appetite. Just can't breath and has a lot of anxiety. + orthopnea and PND. No chest pain. No dizziness or syncope. Still having hallucinations that disrupt his sleep.    PMH: 1. Chronic systolic CHF: Nonischemic cardiomyopathy.  End-stage, now on home milrinone.  - St Jude CRT-D => nonresponder, BiV pacing off.  - LHC/RHC (02/25/16): RA 9, PA 36/12, PCWP mean 13, CI 1.89.  Nonobstructive CAD.  - RHC (03/08/16): RA 2, PA 31/12, PCWP mean 7, CI 2.15 - Echo (7/17): Moderate LV dilation with EF 15-20%, severe diffuse hypokinesis, moderate to severe biatrial enlargement, moderately dilated RV with decreased systolic function, moderate MR.  - RHC (11/17): RA 4, PA 45/16 mean 31, mean PCWP 20, CI 1.54 2. Atrial fibrillation: Paroxysmal.  3. PE: 3/16, had IVC filter that was removed in 10/16.  4. CVA: 2/16.  5. CKD stage III.  6. H/o VT 7. Seizure disorder 8. COPD: Prior smoker.  9. H/o SAH 10. CAD: Nonobstructive on 7/17 cath.  11. 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    Problem List  Past Medical History:  Diagnosis Date  . Atrial fibrillation (HCC)   . Cardiomyopathy (HCC)   . Chronic renal disease, stage III   . Chronic systolic heart failure (HCC)   .  COPD (chronic obstructive pulmonary disease) (HCC)   . 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 Diastolic dysfunction    Grade 3  . Essential hypertension   . PE (pulmonary embolism) March 2016  . Pulmonary HTN    Rt heart cath June 2016  . SAH (subarachnoid hemorrhage) (HCC) Dec 2015  . Seizures (HCC)     Past Surgical History:  Procedure Laterality Date  . ANKLE SURGERY    . CARDIAC CATHETERIZATION N/A 02/25/2016   Procedure: Right/Left Heart Cath and  Coronary Angiography;  Surgeon: Laurey Moralealton S McLean, MD;  [facility];  Service: Cardiovascular;  Laterality: N/A;  . CARDIAC CATHETERIZATION N/A 03/08/2016   Procedure: Right Heart Cath;  Surgeon: Laurey Moralealton S McLean, MD;  [facility];  Service: Cardiovascular;  Laterality: N/A;  . CARDIAC CATHETERIZATION N/A 07/01/2016   Procedure: Right Heart Cath;  Surgeon: Laurey Moralealton S McLean, MD;  [facility];  Service: Cardiovascular;  Laterality: N/A;  . CORONARY ANGIOPLASTY    . CORONARY ANGIOPLASTY    . INSERTION OF ICD  09/25/12   Bi V St Jude   . IVC FILTER PLACEMENT [facility] HX)  March 2016  . IVC FILTER REMOVAL [facility] HX)  Oct 2016  . TOTAL KNEE ARTHROPLASTY Bilateral      Allergies  Allergies  Allergen Reactions  . 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     Home Medications  Prior to Admission medications   Medication Sig Start Date End Date Taking? Authorizing Provider  acetaminophen (TYLENOL) 325 MG tablet Take 2 tablets (650 mg total) by mouth every 4 (four) hours as needed for headache or mild pain. 03/10/16  Yes Renae FickleMackenzie Short, MD  albuterol (PROVENTIL HFA;VENTOLIN HFA) 108 (90 Base) MCG/ACT inhaler Inhale 2 puffs into the lungs every 6 (six) hours as needed for wheezing or shortness of breath. 02/12/16  Yes Marily MemosJason Mesner, MD  digoxin (LANOXIN) 0.125 MG tablet Take 0.5 tablets (0.0625 mg total) by mouth daily. 06/29/16  Yes Laurey Moralealton S McLean, MD  guaiFENesin (MUCINEX) 600 MG 12 hr tablet Take 2 tablets (1,200 mg total) by mouth 2 (two) times daily. 03/25/16  Yes Bevelyn Bucklesaniel R Bensimhon, MD  ipratropium-albuterol (DUONEB) 0.5-2.5 (3) MG/3ML SOLN Take 3 mLs by nebulization every 4 (four) hours as needed (wheezing).   Yes Historical Provider, MD  levETIRAcetam (KEPPRA) 500 MG tablet Take 500 mg by mouth daily.    Yes Historical Provider, MD  LORazepam (ATIVAN) 0.5 MG tablet Take 0.5 mg by mouth every 8 (eight) hours  as needed for anxiety. Reported on 02/23/2016   Yes Historical Provider, MD  meclizine (ANTIVERT) 25 MG tablet Take 25 mg by mouth 2 (two) times daily as needed for dizziness.   Yes Historical Provider, MD  Melatonin 3 MG TABS Take 3 mg by mouth at bedtime.   Yes Historical Provider, MD  methimazole (TAPAZOLE) 10 MG tablet Take 1 tablet (10 mg total) by mouth 2 (two) times daily. 06/23/16  Yes Carlus Pavlovristina Gherghe, MD  mexiletine (MEXITIL) 200 MG capsule Take 1 capsule (200 mg total) by mouth 3 (three) times daily. 06/13/16  Yes Antoine PocheJonathan F Branch, MD  milrinone [facility](PRIMACOR) 20 MG/100 ML SOLN infusion Inject 18.475 mcg/min into the vein continuous. 07/04/16  Yes Graciella FreerMichael Andrew Tillery, PA-C  nitroGLYCERIN (NITROSTAT) 0.4 MG SL tablet Place 1 tablet (0.4 mg total) under the tongue every 5 (five) minutes as needed for chest pain. 02/03/16  Yes Devoria Albe, MD  Nutritional Supplements (NUTRITIONAL DRINK PO) Take 120 mLs by mouth daily. Med pass 2.0   Yes Historical Provider, MD  omeprazole (PRILOSEC) 20 MG capsule Take 20 mg by mouth daily.   Yes Historical Provider, MD  polyethylene glycol (MIRALAX / GLYCOLAX) packet Take 17 g by mouth daily. 03/10/16  Yes Renae Fickle, MD  potassium chloride SA (K-DUR,KLOR-CON) 20 MEQ tablet Take 2 tablets (40 mEq total) by mouth daily. 07/25/16  Yes Laurey Morale, MD  Rivaroxaban (XARELTO) 15 MG TABS tablet Take 15 mg by mouth daily with supper.   Yes Historical Provider, MD  senna (SENOKOT) 8.6 MG tablet Take 1 tablet by mouth every 4 (four) hours.   Yes Historical Provider, MD  sertraline (ZOLOFT) 50 MG tablet Take 75 mg by mouth daily.    Yes Historical Provider, MD  spironolactone (ALDACTONE) 25 MG tablet Take 25 mg by mouth daily.   Yes Historical Provider, MD  temazepam (RESTORIL) 7.5 MG capsule Take 7.5 mg by mouth at bedtime as needed for sleep.   Yes Historical Provider, MD  torsemide (DEMADEX) 20 MG tablet Take 1 tablet (20 mg total) by mouth daily. 07/05/16  Yes  Graciella Freer, PA-C  traMADol (ULTRAM) 50 MG tablet Take 50 mg by mouth every 6 (six) hours as needed for moderate pain or severe pain.    Historical Provider, MD    Family History  Family History  Problem Relation Age of Onset  . Cardiomyopathy    . Sudden death        Family Status  Relation Status  .    Marland Kitchen       Social History  Social History   Social History  . Marital status: Single    Spouse name: N/A  . Number of children: N/A  . Years of education: N/A   Occupational History  . Not on file.   Social History Main Topics  . Smoking status: Former Games developer  . Smokeless tobacco: Never Used     Comment: smoked 1.5 ppd x 30 years, quit in 2012)  . Alcohol use No  . Drug use: No  . Sexual activity: Not on file   Other Topics Concern  . Not on file   Social History Narrative  . No narrative on file     Review of Systems General:  No chills, fever, night sweats or weight changes.  Cardiovascular:  No chest pain, ++dyspnea on exertion, edema, orthopnea, no palpitations,++ paroxysmal nocturnal dyspnea. Dermatological: No rash, lesions/masses Respiratory: ++ cough, dyspnea Urologic: No hematuria, dysuria Abdominal:   No nausea, vomiting, diarrhea, bright red blood per rectum, melena, or hematemesis Neurologic:  No visual changes, wkns, changes in mental status. ++ nocturnal hallucinations  All other systems reviewed and are otherwise negative except as noted above.  Physical Exam  Blood pressure 103/78, pulse 91, temperature 97.4 F (36.3 C), temperature source Oral, resp. rate (!) 32, height 6\' 3"  (1.905 m), weight 152 lb 1.9 oz (69 kg), SpO2 100 %.  General: Pleasant, NAD, chronically ill appearing. Psych: Normal affect. Neuro: Alert and oriented X 3. Moves all extremities spontaneously. HEENT: Normal  Neck: Supple without bruits, no JVD. Lungs:  Resp regular and unlabored, decreased breath sounds at bases Heart: RRR no s3, s4, or  murmurs. Abdomen: Soft, non-tender, non-distended, BS + x 4.  Extremities: No clubbing, cyanosis or edema. DP/PT/Radials 2+ and equal bilaterally.  Labs   Recent Labs  07/23/2016 0911  TROPONINI 0.04*  Lab Results  Component Value Date   WBC 8.2 08/07/2016   HGB 12.4 (L) 08/10/2016   HCT 36.8 (L) 08/10/2016   MCV 91.8 07/19/2016   PLT 360 08/04/2016    Recent Labs Lab 08/04/2016 0911  NA 138  K 3.3*  CL 104  CO2 21*  BUN 22*  CREATININE 1.31*  CALCIUM 9.0  GLUCOSE 103*    Radiology/Studies  Dg Chest 2 View  Result Date: 07/25/2016 CLINICAL DATA:  HO chest pain, shortness of breath EXAM: CHEST  2 VIEW COMPARISON:  07/01/2016 FINDINGS: Left pacer and right PICC line remain in place, unchanged. Cardiomegaly. Worsening bilateral airspace disease, likely edema/ CHF. No visible effusions. IMPRESSION: Worsening bilateral airspace disease, likely CHF. Electronically Signed   By: Charlett Nose M.D.   On: 08/10/2016 10:14   Dg Chest Port 1 View  Result Date: 07/01/2016 CLINICAL DATA:  PICC line placement EXAM: PORTABLE CHEST 1 VIEW COMPARISON:  1827 hours on the same day FINDINGS: Right-sided PICC line tip is seen in the right atrium. Pullback approximately 3.7 cm placing the cavoatrial junction. Heart is enlarged. There is mild pulmonary vascular congestion consistent with CHF. Stable ICD and leads positions. No acute osseous abnormality. IMPRESSION: Right-sided PICC line tip is seen in the right atrium as before. Pullback approximately 3.7 cm. Cardiomegaly with mild CHF. Electronically Signed   By: Tollie Eth M.D.   On: 07/01/2016 19:41   Dg Chest Port 1 View  Result Date: 07/01/2016 CLINICAL DATA:  Right side PICC line placement EXAM: PORTABLE CHEST 1 VIEW COMPARISON:  06/21/2016 FINDINGS: Right PICC line has been placed with the tip in the mid right atrium approximately 3 cm from the cavoatrial junction. Left pacer is unchanged. Cardiomegaly. No confluent opacities, effusions  or edema. IMPRESSION: Right PICC line tip in the mid right atrium approximately 3 cm below the cavoatrial junction. Stable cardiomegaly. Electronically Signed   By: Charlett Nose M.D.   On: 07/01/2016 18:38    ECG  Sinus, LBBB  ASSESSMENT AND PLAN  1.Chronic systolic CHF: Nonischemic cardiomyopathy.  St Jude CRT-D device, not set to BiV pace b/c ineffective. EF 15-20% on 7/17 echo. End stage CHF on home milrinone.  He is not volume overloaded on exam.  Profound fatigue is explained by low output, as evidenced by low co ox despite milrinone 0.25 at last office visit. - milrinone increased to 0.375 mcg/kg/min on 07/25/16 in clinic.  - Continue torsemide 20 mg daily.  - Continue digoxin 0.0625 mg daily.   - Continue spironolactone 25 mg daily.  - No  Bidil, unable to tolerate Imdur due to headache.   - Pt is not a candidate for advanced therapies due to lack of social support.  He is living at [facility]. He is aware that milrinone is palliative. - Weight is actually down from office on 12/11 (159--> 152). Given one dose of IV lasix 40mg  in the ER. Will hold off on any diuresis or milrinone adjustment until we get co-ox level back. Suspect this is end stage CHF. I have consulted palliative care, but patient does seem to have a large component of denial   2. VT: Last shock 06/13/16. Off amiodarone due to hyperthyroidism. Now on Mexiletine  3. Atrial fibrillation: Paroxysmal.  He is on Xarelto 15 daily (renally dosed).  He is in NSR today.   4. 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 Seeing endocrine, he is on methimazole and we've stopped amiodarone.     5. H/O PE: On Xarelto.  6. Suspect OSA: Hasn't had sleep study.  Unlikely to improve his mortality at this point.   7. Depression: Depressed mood, very down about living at [facility] and his overall poor prognosis.   8. Hallucinations: Tend to occur at night.  ?Related to low output.  He was referred to neurology as an outpatient.  Dispo: Dr.  Shirlee Latch wanted to try to keep him out of hospital. Overall, Mr Ripp has a very poor prognosis. Dr. Shirlee Latch suspects he has <6 months. Will plan for admission with palliative care consult. Will hold off on increasing diuretics or milrinone until get co-ox back and discuss with Dr. Shirlee Latch   Signed, Cline Crock, PA-C 08/07/2016, 12:19 PM  Pager [phone]  Attending Note:   The patient was seen and examined.  Agree with assessment and plan as noted above.  Changes made to the above note as needed.  Patient seen and independently examined with Carlean Jews, PA.   We discussed all aspects of the encounter. I agree with the assessment and plan as stated above.  1. End stage chronic systolic CHF: He is on milrinone. He is admitted for profound fatigue, poor appetite,  Weight loss. I think this is due to poor forward output. Will get a co-ox .  This will help [place] decide if he needs more milrinone  I have reviewed notes and discussed the case with Carlean Jews, PA I think that our best option is for him is comfort care / palliative care.  He has expressed wishes to NOT have CPR, intubation or other code measures.    Note to Palliative care.   Patient's daughter would like to be present for the Palliative care evaluation.    It is very difficult to understand him when he is talking and I agree that the daughter should be present for the evaluation.  Ottie Glazier Lips [phone]     I have spent a total of 40 minutes with patient reviewing hospital  notes , telemetry, EKGs, labs and examining patient as well as establishing an assessment and plan that was discussed with the patient. > 50% of time was spent in direct patient care.    Vesta Mixer, Montez Hageman., MD, [facility] Aug 07, 2016, 1:22 PM [address],  [address] Office [phone] Pager [phone]

## 2016-07-30 NOTE — ED Notes (Signed)
Patient transported to X-ray 

## 2016-07-30 NOTE — ED Triage Notes (Signed)
Pt. Coming from [facility] via [facility] for shortness of breath. Pt. sts this started this am. Fire found patient on NRB at 1.5L of oxygenn when they arrived. Pt. sts he had a breathing tx prior to EMS arriving. Pt. Tachypnea, but breath sounds clear bilaterally and pt. Speaking in full sentences. Pt. Noted to have milrinone pump going into picc line in right upper arm. Pt. Recently dx with CHF. Pt. Arrived with most form. Pt. Aox4.

## 2016-07-30 NOTE — ED Provider Notes (Addendum)
[facility]-EMERGENCY DEPT Provider Note   CSN: [id number] Arrival date & time: 07/24/2016  0847     History   Chief Complaint Chief Complaint  Patient presents with  . Shortness of Breath    HPI Johnathan Morrison is a 70 y.o. male.  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 Symptoms moderate, persistent, worse in past day.  Patient indicates his infusion rate was increased in past week, but feels no better.  Pt unsure of weight increase. Denies increased leg edema. No cough or uri c/o. No fever or chills. Denies chest pain or discomfort.    The history is provided by the patient.  Shortness of Breath  Pertinent negatives include no fever, no headaches, no sore throat, no neck pain, no chest pain, no abdominal pain and no rash.    Past Medical History:  Diagnosis Date  . Atrial fibrillation (HCC)   . Cardiomyopathy (HCC)   . Chronic renal disease, stage III   . Chronic systolic heart failure (HCC)   . COPD (chronic obstructive pulmonary disease) (HCC)   . 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 Diastolic dysfunction    Grade 3  . Essential hypertension   . PE (pulmonary embolism) March 2016  . Pulmonary HTN    Rt heart cath June 2016  . SAH (subarachnoid hemorrhage) (HCC) Dec 2015  . Seizures [facility])     Patient Active Problem List   Diagnosis Date Noted  . Chronic systolic CHF (congestive heart failure) (HCC) 07/13/2016  . Low output heart failure (HCC) 07/03/2016  . DNI (do not intubate) 07/03/2016  . Protein-calorie malnutrition, severe 03/08/2016  . Severe protein-energy malnutrition (HCC) 03/08/2016  . Thyrotoxicosis   . Goals of care, counseling/discussion   . Palliative care encounter   . Loss of weight   . Chest pain 03/05/2016  . NSVT (nonsustained ventricular tachycardia) (HCC) 02/24/2016  . Chronic renal disease, stage III   . Diastolic dysfunction   . Pulmonary HTN - Rt heart cath  June 2016   . COPD (chronic obstructive pulmonary disease) (HCC)   . History of PE-March 2016   . Cardiomyopathy-presumably NICM   . A-fib (HCC) 02/23/2016  . Chronic anticoagulation 02/23/2016  . 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 HTN (hypertension) 02/23/2016  . History of seizure 02/23/2016  . 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 SAH (subarachnoid hemorrhage)-Dec 2015 07/15/2014    Past Surgical History:  Procedure Laterality Date  . ANKLE SURGERY    . CARDIAC CATHETERIZATION N/A 02/25/2016   Procedure: Right/Left Heart Cath and Coronary Angiography;  Surgeon: Laurey Morale, MD;  Location: [facility] INVASIVE CV LAB;  Service: Cardiovascular;  Laterality: N/A;  . CARDIAC CATHETERIZATION N/A 03/08/2016   Procedure: Right Heart Cath;  Surgeon: Laurey Morale, MD;  Location: [facility] INVASIVE CV LAB;  Service: Cardiovascular;  Laterality: N/A;  . CARDIAC CATHETERIZATION N/A 07/01/2016   Procedure: Right Heart Cath;  Surgeon: Laurey Morale, MD;  Location: [facility] INVASIVE CV LAB;  Service: Cardiovascular;  Laterality: N/A;  . CORONARY ANGIOPLASTY    . CORONARY ANGIOPLASTY    . INSERTION OF ICD  09/25/12   Bi V St Jude   . IVC FILTER PLACEMENT [facility] HX)  March 2016  . IVC FILTER REMOVAL [facility] HX)  Oct 2016  . TOTAL KNEE ARTHROPLASTY Bilateral        Home Medications    Prior  to Admission medications   Medication Sig Start Date End Date Taking? Authorizing Provider  acetaminophen (TYLENOL) 325 MG tablet Take 2 tablets (650 mg total) by mouth every 4 (four) hours as needed for headache or mild pain. 03/10/16   Renae Fickle, MD  albuterol (PROVENTIL HFA;VENTOLIN HFA) 108 (90 Base) MCG/ACT inhaler Inhale 2 puffs into the lungs every 6 (six) hours as needed for wheezing or shortness of breath. 02/12/16   [name], MD  atorvastatin (LIPITOR) 40 MG tablet Take 40 mg by mouth every morning.  01/27/16   Historical Provider, MD  digoxin (LANOXIN) 0.125 MG tablet Take 0.5 tablets  (0.0625 mg total) by mouth daily. 06/29/16   Laurey Morale, MD  guaiFENesin (MUCINEX) 600 MG 12 hr tablet Take 2 tablets (1,200 mg total) by mouth 2 (two) times daily. 03/25/16   Dolores Patty, MD  levETIRAcetam (KEPPRA) 500 MG tablet Take 500 mg by mouth daily.     Historical Provider, MD  LORazepam (ATIVAN) 0.5 MG tablet Take 0.5 mg by mouth every 8 (eight) hours as needed for anxiety. Reported on 02/23/2016    Historical Provider, MD  meclizine (ANTIVERT) 25 MG tablet Take 25 mg by mouth 2 (two) times daily as needed for dizziness.    Historical Provider, MD  Melatonin 3 MG TABS Take 3 mg by mouth at bedtime.    Historical Provider, MD  methimazole (TAPAZOLE) 10 MG tablet Take 1 tablet (10 mg total) by mouth 2 (two) times daily. Patient taking differently: Take 10 mg by mouth 3 (three) times daily.  06/23/16   Carlus Pavlov, MD  mexiletine (MEXITIL) 200 MG capsule Take 1 capsule (200 mg total) by mouth 3 (three) times daily. 06/13/16   Antoine Poche, MD  milrinone (PRIMACOR) 20 MG/100 ML SOLN infusion Inject 18.475 mcg/min into the vein continuous. 07/04/16   Graciella Freer, PA-C  nitroGLYCERIN (NITROSTAT) 0.4 MG SL tablet Place 1 tablet (0.4 mg total) under the tongue every 5 (five) minutes as needed for chest pain. Patient not taking: Reported on 07/25/2016 02/03/16   Devoria Albe, MD  omeprazole (PRILOSEC) 20 MG capsule Take 20 mg by mouth daily.    Historical Provider, MD  polyethylene glycol (MIRALAX / GLYCOLAX) packet Take 17 g by mouth daily. 03/10/16   Renae Fickle, MD  potassium chloride SA (K-DUR,KLOR-CON) 20 MEQ tablet Take 2 tablets (40 mEq total) by mouth daily. 07/25/16   Laurey Morale, MD  Rivaroxaban (XARELTO) 15 MG TABS tablet Take 15 mg by mouth daily with supper.    Historical Provider, MD  sertraline (ZOLOFT) 50 MG tablet Take 50 mg by mouth daily.    Historical Provider, MD  spironolactone (ALDACTONE) 25 MG tablet Take 25 mg by mouth daily.    Historical  Provider, MD  temazepam (RESTORIL) 7.5 MG capsule Take 7.5 mg by mouth at bedtime as needed for sleep.    Historical Provider, MD  torsemide (DEMADEX) 20 MG tablet Take 1 tablet (20 mg total) by mouth daily. 07/05/16   Graciella Freer, PA-C  traMADol (ULTRAM) 50 MG tablet Take 50 mg by mouth every 6 (six) hours as needed for moderate pain or severe pain.    Historical Provider, MD    Family History Family History  Problem Relation Age of Onset  . Cardiomyopathy    . Sudden death      Social History Social History  Substance Use Topics  . Smoking status: Former Games developer  . Smokeless tobacco: Never Used  Comment: smoked 1.5 ppd x 30 years, quit in 2012)  . Alcohol use No     Allergies   Amiodarone   Review of Systems Review of Systems  Constitutional: Negative for fever.  HENT: Negative for sore throat.   Eyes: Negative for redness.  Respiratory: Positive for shortness of breath.   Cardiovascular: Negative for chest pain.  Gastrointestinal: Negative for abdominal pain.  Genitourinary: Negative for flank pain.  Musculoskeletal: Negative for neck pain.  Skin: Negative for rash.  Neurological: Negative for headaches.  Hematological: Does not bruise/bleed easily.  Psychiatric/Behavioral: Negative for confusion.     Physical Exam Updated Vital Signs BP 104/83 (BP Location: Left Arm)   Pulse 96   Temp 97.4 F (36.3 C) (Oral)   Resp (!) 30   Ht 6\' 3"  (1.905 m)   Wt 72.1 kg   SpO2 96%   BMI 19.87 kg/m   Physical Exam  Constitutional: He is oriented to person, place, and time. No distress.  Generally weak/frail appearing.   HENT:  Mouth/Throat: Oropharynx is clear and moist.  Eyes: Conjunctivae are normal.  Neck: Neck supple. No tracheal deviation present.  Cardiovascular: Normal rate, regular rhythm, normal heart sounds and intact distal pulses.   Pulmonary/Chest: No accessory muscle usage. He is in respiratory distress. He has rales.  Abdominal: Soft.  He exhibits no distension. There is no tenderness.  Musculoskeletal: He exhibits no edema.  Neurological: He is alert and oriented to person, place, and time.  Skin: Skin is warm and dry. He is not diaphoretic.  Psychiatric: He has a normal mood and affect.  Nursing note and vitals reviewed.    ED Treatments / Results  Labs (all labs ordered are listed, but only abnormal results are displayed) Results for orders placed or performed during the hospital encounter of Aug 14, 2016  Basic metabolic panel  Result Value Ref Range   Sodium 138 135 - 145 mmol/L   Potassium 3.3 (L) 3.5 - 5.1 mmol/L   Chloride 104 101 - 111 mmol/L   CO2 21 (L) 22 - 32 mmol/L   Glucose, Bld 103 (H) 65 - 99 mg/dL   BUN 22 (H) 6 - 20 mg/dL   Creatinine, Ser 1.61 (H) 0.61 - 1.24 mg/dL   Calcium 9.0 8.9 - 09.6 mg/dL   GFR calc non Af Amer 54 (L) >60 mL/min   GFR calc Af Amer >60 >60 mL/min   Anion gap 13 5 - 15  CBC  Result Value Ref Range   WBC 8.2 4.0 - 10.5 K/uL   RBC 4.01 (L) 4.22 - 5.81 MIL/uL   Hemoglobin 12.4 (L) 13.0 - 17.0 g/dL   HCT 04.5 (L) 40.9 - 81.1 %   MCV 91.8 78.0 - 100.0 fL   MCH 30.9 26.0 - 34.0 pg   MCHC 33.7 30.0 - 36.0 g/dL   RDW 91.4 78.2 - 95.6 %   Platelets 360 150 - 400 K/uL  Troponin I  Result Value Ref Range   Troponin I 0.04 (HH) <0.03 ng/mL  Digoxin level  Result Value Ref Range   Digoxin Level 0.3 (L) 0.8 - 2.0 ng/mL   Dg Chest 2 View  Result Date: 08/14/2016 CLINICAL DATA:  HO chest pain, shortness of breath EXAM: CHEST  2 VIEW COMPARISON:  07/01/2016 FINDINGS: Left pacer and right PICC line remain in place, unchanged. Cardiomegaly. Worsening bilateral airspace disease, likely edema/ CHF. No visible effusions. IMPRESSION: Worsening bilateral airspace disease, likely CHF. Electronically Signed   By: Caryn Bee  Dover M.D.   On: 07/22/2016 10:14   Dg Chest Port 1 View  Result Date: 07/01/2016 CLINICAL DATA:  PICC line placement EXAM: PORTABLE CHEST 1 VIEW COMPARISON:  1827  hours on the same day FINDINGS: Right-sided PICC line tip is seen in the right atrium. Pullback approximately 3.7 cm placing the cavoatrial junction. Heart is enlarged. There is mild pulmonary vascular congestion consistent with CHF. Stable ICD and leads positions. No acute osseous abnormality. IMPRESSION: Right-sided PICC line tip is seen in the right atrium as before. Pullback approximately 3.7 cm. Cardiomegaly with mild CHF. Electronically Signed   By: Tollie Ethavid  Kwon M.D.   On: 07/01/2016 19:41   Dg Chest Port 1 View  Result Date: 07/01/2016 CLINICAL DATA:  Right side PICC line placement EXAM: PORTABLE CHEST 1 VIEW COMPARISON:  06/21/2016 FINDINGS: Right PICC line has been placed with the tip in the mid right atrium approximately 3 cm from the cavoatrial junction. Left pacer is unchanged. Cardiomegaly. No confluent opacities, effusions or edema. IMPRESSION: Right PICC line tip in the mid right atrium approximately 3 cm below the cavoatrial junction. Stable cardiomegaly. Electronically Signed   By: Charlett NoseKevin  Dover M.D.   On: 07/01/2016 18:38    EKG  EKG Interpretation  Date/Time:  Saturday July 30 2016 09:00:11 EST Ventricular Rate:  90 PR Interval:    QRS Duration: 130 QT Interval:  391 QTC Calculation: 479 R Axis:   -64 Text Interpretation:  Sinus rhythm Atrial premature complex Left bundle branch block Confirmed by Denton LankSTEINL  MD, Caryn BeeKEVIN ([id number]) on 08/10/2016 9:04:17 AM       Radiology Dg Chest 2 View  Result Date: 07/31/2016 CLINICAL DATA:  HO chest pain, shortness of breath EXAM: CHEST  2 VIEW COMPARISON:  07/01/2016 FINDINGS: Left pacer and right PICC line remain in place, unchanged. Cardiomegaly. Worsening bilateral airspace disease, likely edema/ CHF. No visible effusions. IMPRESSION: Worsening bilateral airspace disease, likely CHF. Electronically Signed   By: Charlett NoseKevin  Dover M.D.   On: 07/18/2016 10:14    Procedures Procedures (including critical care time)  Medications Ordered in  ED Medications  0.9 %  sodium chloride infusion (not administered)     Initial Impression / Assessment and Plan / ED Course  I have reviewed the triage vital signs and the nursing notes.  Pertinent labs & imaging results that were available during my care of the patient were reviewed by me and considered in my medical decision making (see chart for details).  Clinical Course     Iv ns. Labs. Cxr.   Reviewed nursing notes and prior charts for additional history.   Lasix iv.   k low, kcl po.  Patient continued on his milrinone infusion.  Wt appears up a few lbs from baseline.  Discussed with Dr Eden EmmsNishan, who indicates to place temp order to admit to Dr Shirlee LatchMclean to a unit bed.     Final Clinical Impressions(s) / ED Diagnoses   Final diagnoses:  None    New Prescriptions New Prescriptions   No medications on file         Cathren LaineKevin Cameo Schmiesing, MD 07/18/2016 [phone]

## 2016-07-30 NOTE — Progress Notes (Signed)
Magnet placed over patient's ICD, per orders.  [employer] paged to notify to ICD deactivation. [place], Mitzi Hansen

## 2016-07-31 DIAGNOSIS — I5043 Acute on chronic combined systolic (congestive) and diastolic (congestive) heart failure: Secondary | ICD-10-CM

## 2016-07-31 MED ORDER — SODIUM CHLORIDE 0.9 % IV SOLN
1.0000 mg/h | INTRAVENOUS | Status: DC
  Administered 2016-07-31: 1 mg/h via INTRAVENOUS
  Filled 2016-07-31: qty 10

## 2016-07-31 NOTE — Progress Notes (Signed)
Patient ID: Abdulrhman Avino, male   DOB: 25-Sep-1945, 70 y.o.   MRN: [id number]   SUBJECTIVE: Patient grunts with stimulation but otherwise sleeping comfortably.  BP stable.  ICD with magnet.   Scheduled Meds: . digoxin  0.0625 mg Oral Daily  . guaiFENesin  1,200 mg Oral BID  . levETIRAcetam  500 mg Oral Daily  . mouth rinse  15 mL Mouth Rinse BID  . Melatonin  3 mg Oral QHS  . methimazole  10 mg Oral BID  . mexiletine  200 mg Oral TID  . pantoprazole  40 mg Oral Daily  . potassium chloride SA  40 mEq Oral Daily  . Rivaroxaban  15 mg Oral Q supper  . sertraline  75 mg Oral Daily  . sodium chloride flush  10-40 mL Intracatheter Q12H  . sodium chloride flush  3 mL Intravenous Q12H  . spironolactone  25 mg Oral Daily  . torsemide  20 mg Oral Daily   Continuous Infusions: . sodium chloride    . milrinone 0.375 mcg/kg/min (07/31/16 0700)   PRN Meds:.sodium chloride, acetaminophen, albuterol, glycopyrrolate, ipratropium-albuterol, LORazepam, meclizine, morphine injection, nitroGLYCERIN, ondansetron (ZOFRAN) IV, senna, sodium chloride flush, sodium chloride flush, traMADol    Vitals:   07/31/16 0000 07/31/16 0100 07/31/16 0342 07/31/16 0400  BP: 93/74 101/80 111/83 106/72  Pulse: 93 89 70 92  Resp: 15 (!) 27 20 16   Temp:   97.5 F (36.4 C)   TempSrc:   Oral   SpO2: 96% 100% 98% (!) 78%  Weight:   149 lb 0.5 oz (67.6 kg)   Height:        Intake/Output Summary (Last 24 hours) at 07/31/16 0746 Last data filed at 07/31/16 0700  Gross per 24 hour  Intake           122.33 ml  Output              400 ml  Net          -277.67 ml    LABS: Basic Metabolic Panel:  Recent Labs  79/15/05 0911  NA 138  K 3.3*  CL 104  CO2 21*  GLUCOSE 103*  BUN 22*  CREATININE 1.31*  CALCIUM 9.0   Liver Function Tests: No results for input(s): AST, ALT, ALKPHOS, BILITOT, PROT, ALBUMIN in the last 72 hours. No results for input(s): LIPASE, AMYLASE in the last 72 hours. CBC:  Recent  Labs  2016-08-20 0911  WBC 8.2  HGB 12.4*  HCT 36.8*  MCV 91.8  PLT 360   Cardiac Enzymes:  Recent Labs  2016-08-20 0911  TROPONINI 0.04*   BNP: Invalid input(s): POCBNP D-Dimer: No results for input(s): DDIMER in the last 72 hours. Hemoglobin A1C: No results for input(s): HGBA1C in the last 72 hours. Fasting Lipid Panel: No results for input(s): CHOL, HDL, LDLCALC, TRIG, CHOLHDL, LDLDIRECT in the last 72 hours. Thyroid Function Tests: No results for input(s): TSH, T4TOTAL, T3FREE, THYROIDAB in the last 72 hours.  Invalid input(s): FREET3 Anemia Panel: No results for input(s): VITAMINB12, FOLATE, FERRITIN, TIBC, IRON, RETICCTPCT in the last 72 hours.  RADIOLOGY: Dg Chest 2 View  Result Date: 2016-08-20 CLINICAL DATA:  HO chest pain, shortness of breath EXAM: CHEST  2 VIEW COMPARISON:  07/01/2016 FINDINGS: Left pacer and right PICC line remain in place, unchanged. Cardiomegaly. Worsening bilateral airspace disease, likely edema/ CHF. No visible effusions. IMPRESSION: Worsening bilateral airspace disease, likely CHF. Electronically Signed   By: Charlett Nose M.D.   On:  07/23/2016 10:14   Dg Chest Port 1 View  Result Date: 07/01/2016 CLINICAL DATA:  PICC line placement EXAM: PORTABLE CHEST 1 VIEW COMPARISON:  1827 hours on the same day FINDINGS: Right-sided PICC line tip is seen in the right atrium. Pullback approximately 3.7 cm placing the cavoatrial junction. Heart is enlarged. There is mild pulmonary vascular congestion consistent with CHF. Stable ICD and leads positions. No acute osseous abnormality. IMPRESSION: Right-sided PICC line tip is seen in the right atrium as before. Pullback approximately 3.7 cm. Cardiomegaly with mild CHF. Electronically Signed   By: Tollie Ethavid  Kwon M.D.   On: 07/01/2016 19:41   Dg Chest Port 1 View  Result Date: 07/01/2016 CLINICAL DATA:  Right side PICC line placement EXAM: PORTABLE CHEST 1 VIEW COMPARISON:  06/21/2016 FINDINGS: Right PICC line has  been placed with the tip in the mid right atrium approximately 3 cm from the cavoatrial junction. Left pacer is unchanged. Cardiomegaly. No confluent opacities, effusions or edema. IMPRESSION: Right PICC line tip in the mid right atrium approximately 3 cm below the cavoatrial junction. Stable cardiomegaly. Electronically Signed   By: Charlett NoseKevin  Dover M.D.   On: 07/01/2016 18:38    PHYSICAL EXAM General: NAD, sleeping Neck: No JVD, no thyromegaly or thyroid nodule.  Lungs: Clear to auscultation bilaterally with normal respiratory effort. CV: Lateral PMI.  Heart regular S1/S2, no S3/S4, no murmur.  No peripheral edema.   Abdomen: Soft, nontender, no hepatosplenomegaly, no distention.  Neurologic: Sleeping Extremities: No clubbing or cyanosis.   TELEMETRY: Reviewed telemetry pt in NSR with PVCs  ASSESSMENT AND PLAN: 70 yo with end-stage cardiomyopathy was admitted with recurrent low output symptoms despite home milrinone.  1.Chronic systolic CHF: Nonischemic cardiomyopathy.  St Jude CRT-D device, not set to BiV pace b/c ineffective. EF 15-20% on 7/17 echo. End stage CHF on home milrinone.  He is not volume overloaded on exam.  Profound fatigue is explained by low output, as evidenced by co-ox 38% several days ago despite milrinone 0.25.  Increase in milrinone to 0.375 but symptoms did not improve.  Admitted with profound fatigue, failure to thrive.  He is not candidate for LVAD given lack of social support (living in SNF).  - Seen by palliative care service, now comfort measures.  Magnet over ICD.   - Will wean off milrinone today and stop medications not contributing to comfort.  - Transfer to 6N palliative care. 2. VT: Last shock 06/13/16. Off amiodarone due to hyperthyroidism.  Mexiletine stopped now that comfort measures.  3. Atrial fibrillation: Paroxysmal.  Xarelto stopped due to comfort measures.  4. Hyperthyroidism: On methimazole.     Expect he will pass away in hospital.    [name]  Laythan Hayter 07/31/2016 7:52 AM

## 2016-07-31 NOTE — Progress Notes (Signed)
Patient's daughter, Ottie Glazier, called and notified of plan to initiate Morphine infusion and wean Milrinone to off. [place], Mitzi Hansen

## 2016-07-31 NOTE — Progress Notes (Signed)
Palliative notified of pt's pain not under control.  Pt restless in bed.  New orders received. Will continue to monitor. Karena Addison T

## 2016-07-31 NOTE — Progress Notes (Signed)
Patient's infusion pump, cell phone and clothing given to daughter.  Patient transferred to 6N. Johnathan Morrison, Johnathan Morrison

## 2016-07-31 NOTE — Progress Notes (Signed)
Daily Progress Note   Patient Name: Johnathan Morrison       Date: 07/31/2016 DOB: 1946-05-17  Age: 70 y.o. MRN#: [id number] Attending Physician: Vesta Mixer, MD Primary Care Physician: [facility] Admit Date: 08-14-16  Reason for Consultation/Follow-up: Terminal Care  Subjective:  not as awake/ alert Overall appears comfortable,  Overnight events noted, see below.    Length of Stay: 1  Current Medications: Scheduled Meds:  . guaiFENesin  1,200 mg Oral BID  . mouth rinse  15 mL Mouth Rinse BID  . Melatonin  3 mg Oral QHS  . methimazole  10 mg Oral BID  . pantoprazole  40 mg Oral Daily  . potassium chloride SA  40 mEq Oral Daily  . sodium chloride flush  10-40 mL Intracatheter Q12H  . sodium chloride flush  3 mL Intravenous Q12H  . torsemide  20 mg Oral Daily    Continuous Infusions: . sodium chloride    . milrinone 0.375 mcg/kg/min (07/31/16 0700)  . morphine      PRN Meds: sodium chloride, acetaminophen, albuterol, glycopyrrolate, ipratropium-albuterol, LORazepam, meclizine, morphine injection, nitroGLYCERIN, ondansetron (ZOFRAN) IV, senna, sodium chloride flush, sodium chloride flush, traMADol  Physical Exam         Shallow breathing Mild distress, some noisy breathing S1 S2 No edema No coolness no mottling noted yet  Vital Signs: BP 100/77   Pulse 98   Temp 97.8 F (36.6 C) (Oral)   Resp (!) 36   Ht 6\' 3"  (1.905 m)   Wt 67.6 kg (149 lb 0.5 oz)   SpO2 91%   BMI 18.63 kg/m  SpO2: SpO2: 91 % O2 Device: O2 Device: Nasal Cannula O2 Flow Rate: O2 Flow Rate (L/min): 4 L/min  Intake/output summary:  Intake/Output Summary (Last 24 hours) at 07/31/16 1043 Last data filed at 07/31/16 0900  Gross per 24 hour  Intake           137.93 ml    Output              400 ml  Net          -262.07 ml   LBM: Last BM Date:  (PTA) Baseline Weight: Weight: 72.1 kg (159 lb) Most recent weight: Weight: 67.6 kg (149 lb 0.5 oz)       Palliative  Assessment/Data:    Flowsheet Rows   Flowsheet Row Most Recent Value  Intake Tab  Referral Department  Cardiology  Unit at Time of Referral  Cardiac/Telemetry Unit  Palliative Care Primary Diagnosis  Cardiac  Palliative Care Type  Return patient Palliative Care  Reason for referral  End of Life Care Assistance  Clinical Assessment  Palliative Performance Scale Score  20%  Pain Max last 24 hours  4  Pain Min Last 24 hours  3  Dyspnea Max Last 24 Hours  4  Dyspnea Min Last 24 hours  3  Nausea Max Last 24 Hours  4  Nausea Min Last 24 Hours  3  Anxiety Max Last 24 Hours  4  Anxiety Min Last 24 Hours  3  Psychosocial & Spiritual Assessment  Palliative Care Outcomes  Patient/Family meeting held?  No  Who was at the meeting?  patient daughter daughter's mother   Palliative Care Outcomes  Clarified goals of care      Patient Active Problem List   Diagnosis Date Noted  . Acute on chronic combined systolic and diastolic CHF (congestive heart failure) (HCC) 07/16/2016  . CHF (congestive heart failure) (HCC) 08/11/2016  . Chronic systolic CHF (congestive heart failure) (HCC) 07/13/2016  . Low output heart failure (HCC) 07/03/2016  . DNI (do not intubate) 07/03/2016  . Protein-calorie malnutrition, severe 03/08/2016  . Severe protein-energy malnutrition (HCC) 03/08/2016  . Thyrotoxicosis   . Goals of care, counseling/discussion   . Palliative care encounter   . Loss of weight   . Chest pain 03/05/2016  . NSVT (nonsustained ventricular tachycardia) (HCC) 02/24/2016  . Chronic renal disease, stage III   . Diastolic dysfunction   . Pulmonary HTN - Rt heart cath June 2016   . COPD (chronic obstructive pulmonary disease) (HCC)   . History of PE-March 2016   . Cardiomyopathy-presumably  NICM   . A-fib (HCC) 02/23/2016  . Chronic anticoagulation 02/23/2016  . 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 HTN (hypertension) 02/23/2016  . History of seizure 02/23/2016  . 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 SAH (subarachnoid hemorrhage)-Dec 2015 07/15/2014    Palliative Care Assessment & Plan   Patient Profile:    Assessment:  end stage cardiomyopathy Chronic systolic CHF EF 15-20% Actively dying Dyspnea at end of life Possibly hypo active terminal delirium   Recommendations/Plan:  Continue comfort measures  Agree with morphine drip  Continue PRN Ativan  Hold PO medications at this time  Agree with cards service, anticipated hospital death, prognosis hours to some very limited number of days at this point.   Goals of Care and Additional Recommendations:  Limitations on Scope of Treatment: Full Comfort Care  Code Status:    Code Status Orders        Start     Ordered   08/11/2016 1426  Do not attempt resuscitation (DNR)  Continuous    Question Answer Comment  In the event of cardiac or respiratory ARREST Do not call a "code blue"   In the event of cardiac or respiratory ARREST Do not perform Intubation, CPR, defibrillation or ACLS   In the event of cardiac or respiratory ARREST Use medication by any route, position, wound care, and other measures to relive pain and suffering. May use oxygen, suction and manual treatment of airway obstruction as needed for comfort.      08/02/2016 1426    Code Status History    Date Active Date Inactive Code Status Order  ID Comments User Context   Dec 09, 2015  1:53 PM Dec 09, 2015  2:26 PM Partial Code [id number]  Janetta HoraKathryn R Thompson, PA-C ED   07/03/2016  8:39 AM 07/04/2016 10:36 PM Partial Code [id number]  Laurey Moralealton S McLean, MD Inpatient   07/01/2016  5:57 PM 07/03/2016  8:39 AM Full Code [id number]  Sherald HessAmy D Clegg, NP Inpatient   03/05/2016 11:22 PM 03/10/2016  7:47 PM DNR [id number]  Clydie Braunondell A Smith, MD ED    02/23/2016  4:59 PM 03/01/2016  4:11 PM DNR [id number]  Houston SirenPeter Le, MD Inpatient   02/23/2016  3:21 PM 02/23/2016  4:59 PM Full Code [id number]  Houston SirenPeter Le, MD ED    Advance Directive Documentation   Flowsheet Row Most Recent Value  Type of Advance Directive  Out of facility DNR (pink MOST or yellow form)  Pre-existing out of facility DNR order (yellow form or pink MOST form)  No data  "MOST" Form in Place?  No data       Prognosis:   Hours - Days  Discharge Planning:  Anticipated Hospital Death  Care plan was discussed with  RN  Thank you for allowing the Palliative Medicine Team to assist in the care of this patient.   Time In: 9 Time Out: 9.25 Total Time 25 Prolonged Time Billed  no       Greater than 50%  of this time was spent counseling and coordinating care related to the above assessment and plan.  Rosalin HawkingZeba Wentworth Edelen, MD [phone]  Please contact Palliative Medicine Team phone at [phone] for questions and concerns.

## 2016-08-01 ENCOUNTER — Encounter (HOSPITAL_COMMUNITY): Payer: Medicare Other

## 2016-08-03 ENCOUNTER — Telehealth: Payer: Self-pay | Admitting: Cardiology

## 2016-08-03 NOTE — Telephone Encounter (Signed)
Original D/C received from [facility] and [facility] signed and completed- called spoke with [facility] made them aware ready for pick up. [phone]

## 2016-08-15 NOTE — Progress Notes (Signed)
Daily Progress Note   Patient Name: Johnathan Morrison       Date: 08/02/2016 DOB: 1945/12/03  Age: 71 y.o. MRN#: [id number] Attending Physician: Vesta MixerPhilip J Nahser, MD Primary Care Physician: [facility] Admit Date: August 24, 2015  Reason for Consultation/Follow-up: Terminal Care  Subjective: Shallow breathing, some apnea spells noted. Overall appears comfortable,    see below.    Length of Stay: 2  Current Medications: Scheduled Meds:  . guaiFENesin  1,200 mg Oral BID  . mouth rinse  15 mL Mouth Rinse BID  . Melatonin  3 mg Oral QHS  . methimazole  10 mg Oral BID  . pantoprazole  40 mg Oral Daily  . potassium chloride SA  40 mEq Oral Daily  . sodium chloride flush  10-40 mL Intracatheter Q12H  . sodium chloride flush  3 mL Intravenous Q12H  . torsemide  20 mg Oral Daily    Continuous Infusions: . sodium chloride    . milrinone Stopped (07/31/16 1254)  . morphine 4 mg/hr (07/31/16 1315)    PRN Meds: sodium chloride, acetaminophen, albuterol, glycopyrrolate, ipratropium-albuterol, LORazepam, meclizine, morphine injection, nitroGLYCERIN, ondansetron (ZOFRAN) IV, senna, sodium chloride flush, sodium chloride flush, traMADol  Physical Exam         Shallow breathing Mild distress, some noisy breathing S1 S2 No edema No coolness no mottling noted yet Unresponsive  Vital Signs: BP 100/77   Pulse (!) 104   Temp 97.8 F (36.6 C) (Oral)   Resp 10   Ht 6\' 3"  (1.905 m)   Wt 66.5 kg (146 lb 9.6 oz)   SpO2 93%   BMI 18.32 kg/m  SpO2: SpO2: 93 % O2 Device: O2 Device: Nasal Cannula O2 Flow Rate: O2 Flow Rate (L/min): 4 L/min  Intake/output summary:   Intake/Output Summary (Last 24 hours) at 08/09/2016 1025 Last data filed at 07/31/16 1819  Gross per 24 hour    Intake            23.21 ml  Output              200 ml  Net          -176.79 ml   LBM: Last BM Date:  (PTA) Baseline Weight: Weight: 72.1 kg (159 lb) Most recent weight: Weight: 66.5 kg (146 lb 9.6 oz)  Palliative Assessment/Data:    Flowsheet Rows   Flowsheet Row Most Recent Value  Intake Tab  Referral Department  Cardiology  Unit at Time of Referral  Cardiac/Telemetry Unit  Palliative Care Primary Diagnosis  Cardiac  Palliative Care Type  Return patient Palliative Care  Reason for referral  End of Life Care Assistance  Clinical Assessment  Palliative Performance Scale Score  20%  Pain Max last 24 hours  4  Pain Min Last 24 hours  3  Dyspnea Max Last 24 Hours  4  Dyspnea Min Last 24 hours  3  Nausea Max Last 24 Hours  4  Nausea Min Last 24 Hours  3  Anxiety Max Last 24 Hours  4  Anxiety Min Last 24 Hours  3  Psychosocial & Spiritual Assessment  Palliative Care Outcomes  Patient/Family meeting held?  No  Who was at the meeting?  patient daughter daughter's mother   Palliative Care Outcomes  Clarified goals of care      Patient Active Problem List   Diagnosis Date Noted  . Acute on chronic combined systolic and diastolic CHF (congestive heart failure) (HCC) 08-13-2016  . CHF (congestive heart failure) (HCC) 08-13-2016  . Chronic systolic CHF (congestive heart failure) (HCC) 07/13/2016  . Low output heart failure (HCC) 07/03/2016  . DNI (do not intubate) 07/03/2016  . Protein-calorie malnutrition, severe 03/08/2016  . Severe protein-energy malnutrition (HCC) 03/08/2016  . Thyrotoxicosis   . Goals of care, counseling/discussion   . Palliative care encounter   . Loss of weight   . Chest pain 03/05/2016  . NSVT (nonsustained ventricular tachycardia) (HCC) 02/24/2016  . Chronic renal disease, stage III   . Diastolic dysfunction   . Pulmonary HTN - Rt heart cath June 2016   . COPD (chronic obstructive pulmonary disease) (HCC)   . History of PE-March 2016   .  Cardiomyopathy-presumably NICM   . A-fib (HCC) 02/23/2016  . Chronic anticoagulation 02/23/2016  . 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 HTN (hypertension) 02/23/2016  . History of seizure 02/23/2016  . 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 SAH (subarachnoid hemorrhage)-Dec 2015 07/15/2014    Palliative Care Assessment & Plan   Patient Profile:    Assessment:  end stage cardiomyopathy Chronic systolic CHF EF 15-20% Actively dying Dyspnea at end of life Possibly hypo active terminal delirium   Recommendations/Plan:  Continue comfort measures  Agree with morphine drip  Continue PRN Ativan  Hold PO medications   Agree with cards service, anticipated hospital death, prognosis hours to some very limited number of days at this point.   Goals of Care and Additional Recommendations:  Limitations on Scope of Treatment: Full Comfort Care  Code Status:    Code Status Orders        Start     Ordered   08-13-16 1426  Do not attempt resuscitation (DNR)  Continuous    Question Answer Comment  In the event of cardiac or respiratory ARREST Do not call a "code blue"   In the event of cardiac or respiratory ARREST Do not perform Intubation, CPR, defibrillation or ACLS   In the event of cardiac or respiratory ARREST Use medication by any route, position, wound care, and other measures to relive pain and suffering. May use oxygen, suction and manual treatment of airway obstruction as needed for comfort.      August 13, 2016 1426    Code Status History    Date Active Date Inactive Code Status Order ID  Comments User Context   07/24/2016  1:53 PM 07/22/2016  2:26 PM Partial Code [id number]  Janetta Hora, PA-C ED   07/03/2016  8:39 AM 07/04/2016 10:36 PM Partial Code [id number]  Laurey Morale, MD Inpatient   07/01/2016  5:57 PM 07/03/2016  8:39 AM Full Code [id number]  Sherald Hess, NP Inpatient   03/05/2016 11:22 PM 03/10/2016  7:47 PM DNR [id number]  Clydie Braun,  MD ED   02/23/2016  4:59 PM 03/01/2016  4:11 PM DNR [id number]  Houston Siren, MD Inpatient   02/23/2016  3:21 PM 02/23/2016  4:59 PM Full Code [id number]  Houston Siren, MD ED    Advance Directive Documentation   Flowsheet Row Most Recent Value  Type of Advance Directive  Out of facility DNR (pink MOST or yellow form)  Pre-existing out of facility DNR order (yellow form or pink MOST form)  No data  "MOST" Form in Place?  No data       Prognosis:   Hours - Days  Discharge Planning:  Anticipated Hospital Death  Care plan was discussed with  PMT, no family at the bedside this am.   Thank you for allowing the Palliative Medicine Team to assist in the care of this patient.   Time In: 9 Time Out: 9.25 Total Time 25 Prolonged Time Billed  no       Greater than 50%  of this time was spent counseling and coordinating care related to the above assessment and plan.  Rosalin Hawking, MD [phone]  Please contact Palliative Medicine Team phone at [phone] for questions and concerns.

## 2016-08-15 NOTE — Discharge Summary (Signed)
  Advanced Heart Failure Death Summary  Death Summary   Patient ID: Johnathan Morrison MRN: [id number], DOB/AGE: August 22, 1945 71 y.o. Admit date: August 19, 2016 D/C date:    07/15/2016   Primary Discharge Diagnoses:  1. Low output heart failure 2. Hx of VT 3. Paroxysmal Afib 4. CKD stage III 5. DNR/DNI  Hospital Course:   Johnathan Morrison is a 71 y.o. 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 He had several admissions in the past few months with HF decompensation. Notably, recently had to have milrinone turned up to 0.375 mcg/kg/min with depressed cardiac output by coox on 0.25 mcg/kg/min.     Prior to this admission, was admitted 07/01/16 and found to have low output. Did not tolerate milrinone wean, so was sent home with milrinone infusion. Pt was aware at this time this was a palliative measure.   Pt primary complaint was profound fatigue. Also complained of worsening SOB, lack of appetite, orthopnea, and PND. All consistent with recurrent low output HF.  Pt also noted to be confused.   Daughter called to bedside to meet with palliative care. Decision from family was made to proceed to DNR/DNI with comfort care with his ongoing discomfort and lack of improvement despite adjustment of medications including milrinone.    All non-essential medications stopped. Milrinone weaned off as comfort measures increased. Magnet used to de-activate ICD. Pt became increasingly lethargic and noted to have agonal breathing am of 08/12/2016 with episode of apnea noted. Pt remained on full comfort measures, and passed away shortly after 1100 am on 07/20/2016.  Every effort was made to improve this patients clinical picture during this and  previous admissions. Pt was not a candidate for advanced therapies including LVAD with lack of social support and co-morbidities.  Ultimately, Johnathan Morrison passed away from multi-system organ failure secondary to low output systolic CHF.   Duration of Discharge Encounter: Greater than 35 minutes   Signed, Graciella Freer, PA-C 08/02/2016, 1:54 PM

## 2016-08-15 NOTE — Progress Notes (Signed)
Pt expired. Wasted Morphine 150 cc in sink. Witnessed by L. Durene Cal RN, DD

## 2016-08-15 NOTE — Progress Notes (Signed)
Patient ID: Johnathan Morrison, male   DOB: [birth year], 71 y.o.   MRN: [id number]   SUBJECTIVE: Sleeping, agonal breathing.  ICD with magnet.   Scheduled Meds: . guaiFENesin  1,200 mg Oral BID  . mouth rinse  15 mL Mouth Rinse BID  . Melatonin  3 mg Oral QHS  . methimazole  10 mg Oral BID  . pantoprazole  40 mg Oral Daily  . potassium chloride SA  40 mEq Oral Daily  . sodium chloride flush  10-40 mL Intracatheter Q12H  . sodium chloride flush  3 mL Intravenous Q12H  . torsemide  20 mg Oral Daily   Continuous Infusions: . sodium chloride    . milrinone Stopped (07/31/16 1254)  . morphine 4 mg/hr (07/31/16 1315)   PRN Meds:.sodium chloride, acetaminophen, albuterol, glycopyrrolate, ipratropium-albuterol, LORazepam, meclizine, morphine injection, nitroGLYCERIN, ondansetron (ZOFRAN) IV, senna, sodium chloride flush, sodium chloride flush, traMADol    Vitals:   07/31/16 1200 07/31/16 1300 07/31/16 1400 08/12/2016 0209  BP:      Pulse:      Resp: 12 19 10    Temp:      TempSrc:      SpO2:      Weight:    146 lb 9.6 oz (66.5 kg)  Height:        Intake/Output Summary (Last 24 hours) at 08/03/2016 0741 Last data filed at 07/31/16 1819  Gross per 24 hour  Intake            46.61 ml  Output              200 ml  Net          -153.39 ml    LABS: Basic Metabolic Panel:  Recent Labs  05/69/79 0911  NA 138  K 3.3*  CL 104  CO2 21*  GLUCOSE 103*  BUN 22*  CREATININE 1.31*  CALCIUM 9.0   Liver Function Tests: No results for input(s): AST, ALT, ALKPHOS, BILITOT, PROT, ALBUMIN in the last 72 hours. No results for input(s): LIPASE, AMYLASE in the last 72 hours. CBC:  Recent Labs  08/08/2016 0911  WBC 8.2  HGB 12.4*  HCT 36.8*  MCV 91.8  PLT 360   Cardiac Enzymes:  Recent Labs  08-Aug-2016 0911  TROPONINI 0.04*   BNP: Invalid input(s): POCBNP D-Dimer: No results for input(s): DDIMER in the last 72 hours. Hemoglobin A1C: No results for input(s): HGBA1C in the last  72 hours. Fasting Lipid Panel: No results for input(s): CHOL, HDL, LDLCALC, TRIG, CHOLHDL, LDLDIRECT in the last 72 hours. Thyroid Function Tests: No results for input(s): TSH, T4TOTAL, T3FREE, THYROIDAB in the last 72 hours.  Invalid input(s): FREET3 Anemia Panel: No results for input(s): VITAMINB12, FOLATE, FERRITIN, TIBC, IRON, RETICCTPCT in the last 72 hours.  RADIOLOGY: Dg Chest 2 View  Result Date: Aug 08, 2016 CLINICAL DATA:  HO chest pain, shortness of breath EXAM: CHEST  2 VIEW COMPARISON:  07/01/2016 FINDINGS: Left pacer and right PICC line remain in place, unchanged. Cardiomegaly. Worsening bilateral airspace disease, likely edema/ CHF. No visible effusions. IMPRESSION: Worsening bilateral airspace disease, likely CHF. Electronically Signed   By: Charlett Nose M.D.   On: Aug 08, 2016 10:14    PHYSICAL EXAM General: NAD, sleeping Neck: No JVD, no thyromegaly or thyroid nodule.  Lungs: Clear to auscultation bilaterally with normal respiratory effort. CV: Lateral PMI.  Heart regular S1/S2, no S3/S4, no murmur.  No peripheral edema.   Abdomen: Soft, nontender, no hepatosplenomegaly, no distention.  Neurologic: Sleeping  Extremities: No clubbing or cyanosis.   TELEMETRY: Reviewed telemetry pt in NSR with PVCs  ASSESSMENT AND PLAN: 71 yo with end-stage cardiomyopathy was admitted with recurrent low output symptoms despite home milrinone.  1.Chronic systolic CHF: Nonischemic cardiomyopathy.  St Jude CRT-D device, not set to BiV pace b/c ineffective. EF 15-20% on 7/17 echo. End stage CHF on home milrinone.  He is not volume overloaded on exam.  Profound fatigue is explained by low output, as evidenced by co-ox 38% several days ago despite milrinone 0.25.  Increase in milrinone to 0.375 but symptoms did not improve.  Admitted with profound fatigue, failure to thrive.  He is not candidate for LVAD given lack of social support (living in SNF).  - Seen by palliative care service, now comfort  measures.  Magnet over ICD.   - Off all medications not needed for palliation (now on morphine gtt).   2. VT: Last shock 06/13/16. Off amiodarone due to hyperthyroidism.  Mexiletine stopped now that comfort measures.  3. Atrial fibrillation: Paroxysmal.  Xarelto stopped due to comfort measures.  4. Hyperthyroidism: On methimazole.     Expect he will pass away in hospital.    [name] [name] 2015/11/25 7:41 AM

## 2016-08-15 DEATH — deceased

## 2016-09-01 ENCOUNTER — Encounter: Payer: Medicare Other | Admitting: Internal Medicine

## 2016-09-16 ENCOUNTER — Encounter: Payer: Medicare Other | Admitting: Internal Medicine

## 2016-09-20 ENCOUNTER — Ambulatory Visit: Payer: Medicare Other | Admitting: Internal Medicine

## 2018-06-22 IMAGING — CT CT ANGIO CHEST
2 of 6 series · 18 of 46 positions shown · IV contrast (ISOVUE)
Comparison: None

CLINICAL DATA: LEFT side chest pain with shortness of breath for 1
day, history hypertension, pacemaker, CHF

EXAM:
CT ANGIOGRAPHY CHEST WITH CONTRAST
TECHNIQUE: Multidetector CT imaging of the chest was performed using the
standard protocol during bolus administration of intravenous
contrast. Multiplanar CT image reconstructions and MIPs were
obtained to evaluate the vascular anatomy.
CONTRAST:  100 cc Isovue 370 IV

[Series 5: pe thins 1.0 · axial · 0.71mm/px · z∈[-288,-38]mm · 15 of 278 slices shown]
[im 14/278  lung]
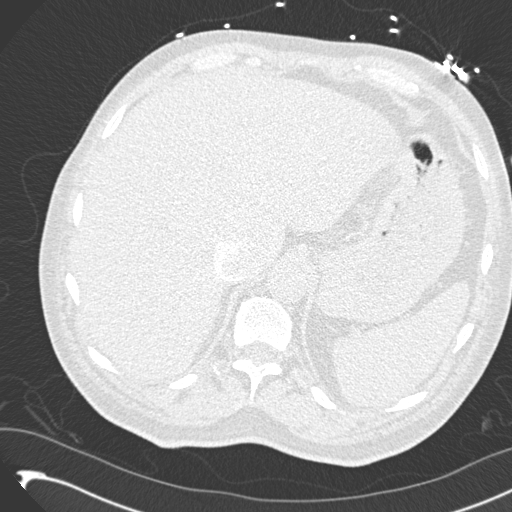
[im 28/278  soft-tissue]
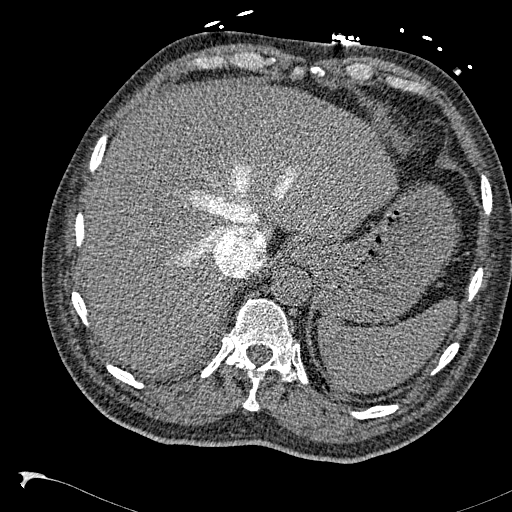
[im 56/278  lung]
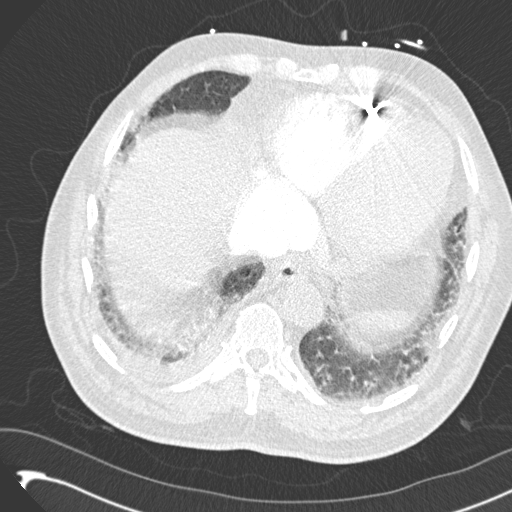
[im 70/278  soft-tissue]
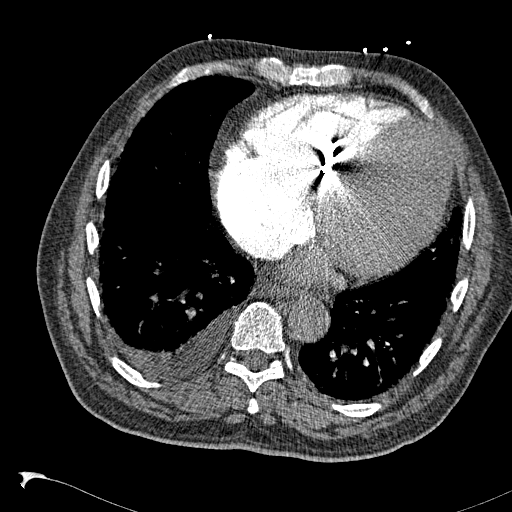
[im 84/278  lung]
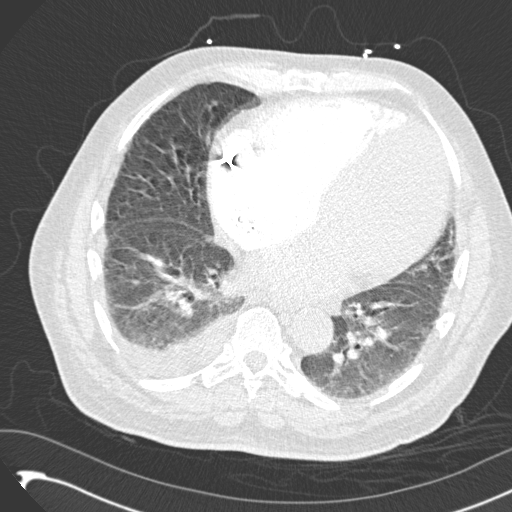
[im 97/278  soft-tissue]
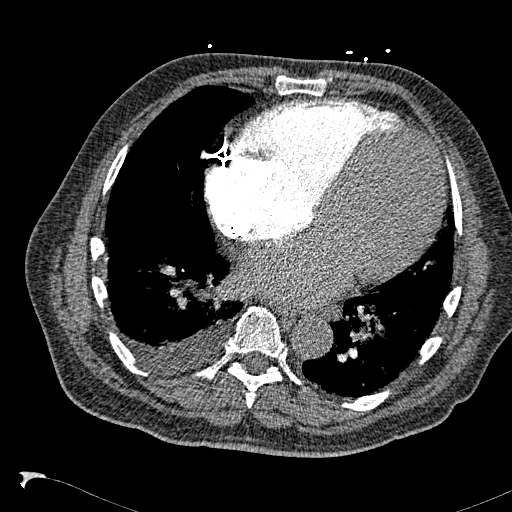
[im 125/278  lung]
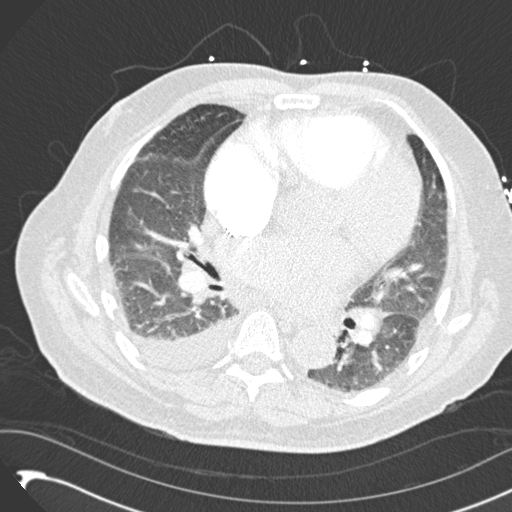
[im 139/278  soft-tissue]
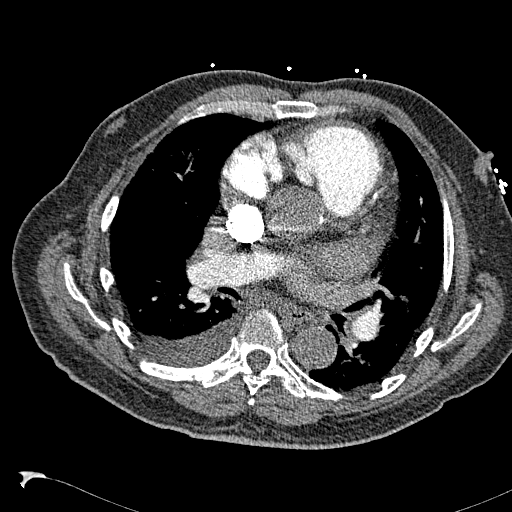
[im 153/278  lung]
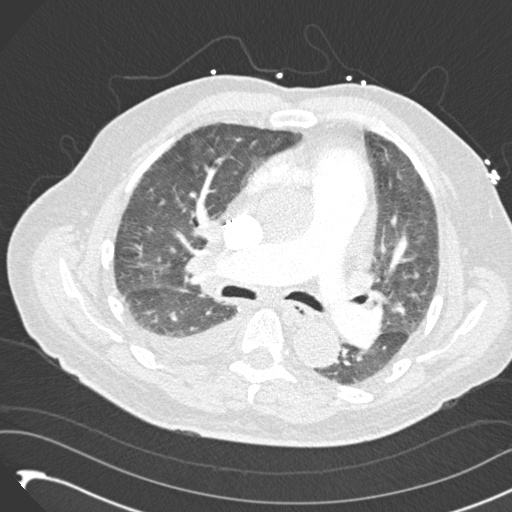
[im 181/278  soft-tissue]
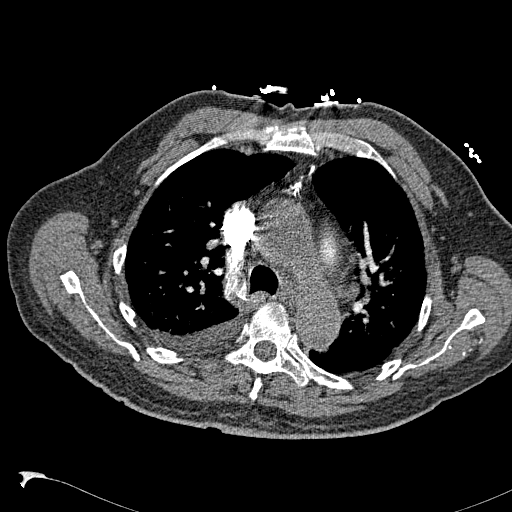
[im 194/278  lung]
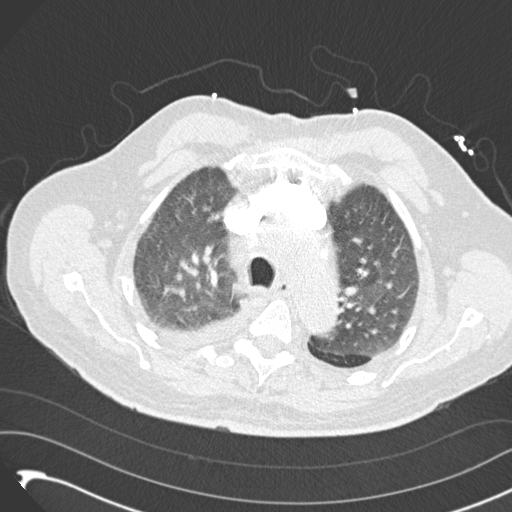
[im 208/278  soft-tissue]
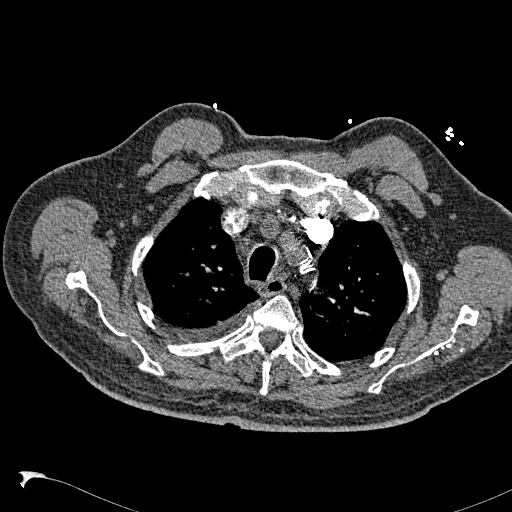
[im 222/278  lung]
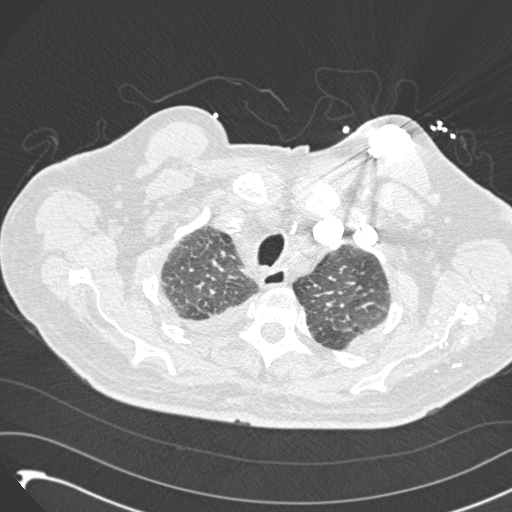
[im 250/278  soft-tissue]
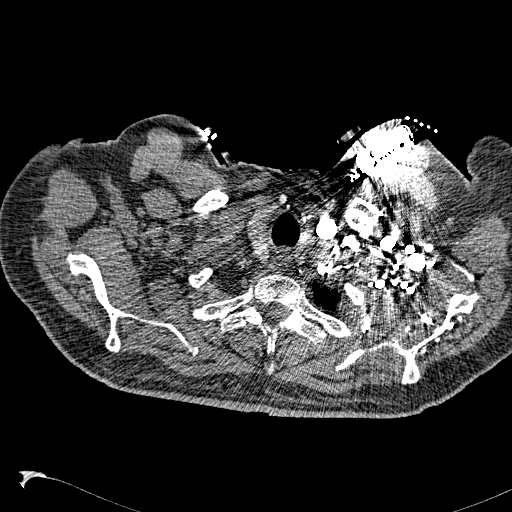
[im 264/278  lung]
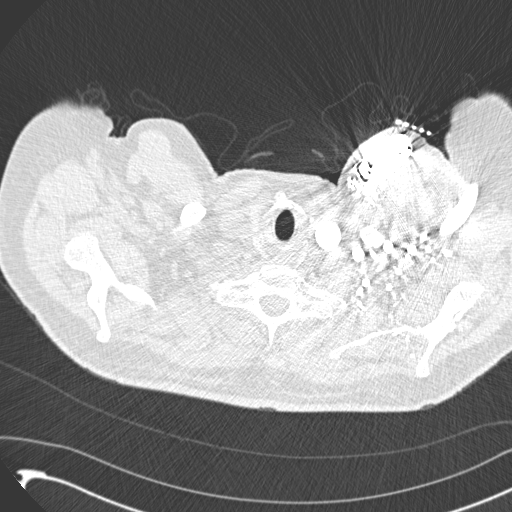

[Series 7: cor mpr 2.0 · coronal · 0.56mm/px · 3 of 145 slices shown]
[im 37/145  soft-tissue]
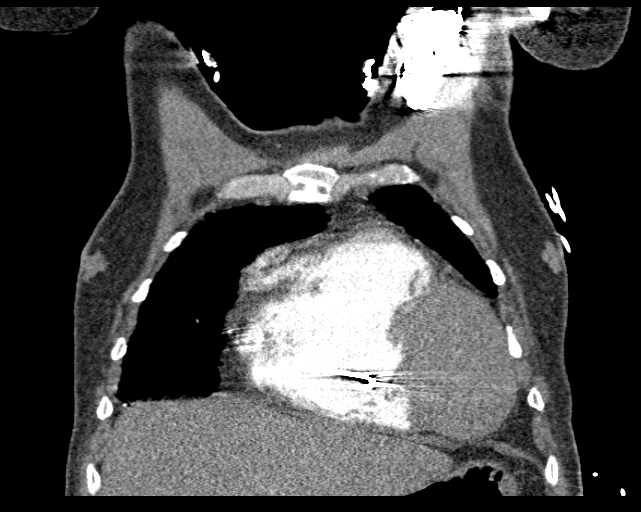
[im 73/145  soft-tissue]
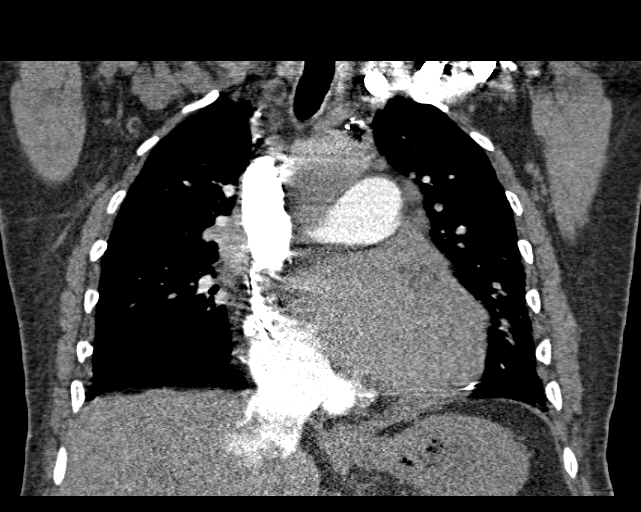
[im 109/145  soft-tissue]
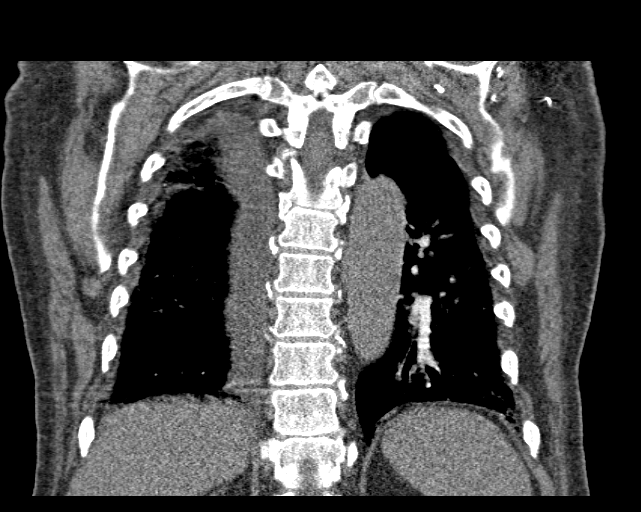

[18 of 46 positions shown; findings below may reference images not displayed]

FINDINGS: Cardiovascular: Few scattered atherosclerotic calcifications aorta
and coronary arteries. Upper normal caliber ascending thoracic aorta
3.8 cm diameter. Mild limitations of exam secondary to scattered
respiratory motion artifacts. Enlarged central pulmonary arteries.
Pulmonary arteries well opacified and grossly patent. No definite
evidence of pulmonary embolism. Beam hardening artifacts from LEFT
subclavian pacemaker leads in RIGHT atrium, RIGHT ventricle, and
coronary sinus. Enlargement of cardiac chambers and cardiac size.

Mediastinum/Nodes: Esophagus unremarkable. Scattered normal size
mediastinal lymph nodes.

Lungs/Pleura: Small RIGHT pleural effusion. Scattered respiratory
motion artifacts. Minimal compressive atelectasis RIGHT lower lobe.
Peripheral interstitial changes at both lung bases. No additional
infiltrate, pneumothorax, or LEFT pleural effusion.

Upper Abdomen: Distended IVC and hepatic veins with reflux of
contrast question elevated RIGHT heart pressure versus sequela power
injection. Remaining upper abdomen normal.

Musculoskeletal: No acute osseous findings.

Review of the MIP images confirms the above findings.
IMPRESSION: No gross evidence of pulmonary embolism identified on exam slightly
limited by rash to motion artifacts.

Question pulmonary arterial hypertension.

Small RIGHT pleural effusion with minimal basilar atelectasis.

Enlargement of cardiac chambers with evidence of prior pacemaker
placement.

## 2018-07-03 IMAGING — DX DG CHEST 2V
2 series · 2 of 2 positions shown · non-contrast
Comparison: Single-view of the chest 02/03/2016. CT chest
02/12/2016.

CLINICAL DATA: Shortness of breath for 4 days, worsened over the
past 2 days. History of congestive heart failure.

EXAM:
CHEST  2 VIEW

[chest pa]
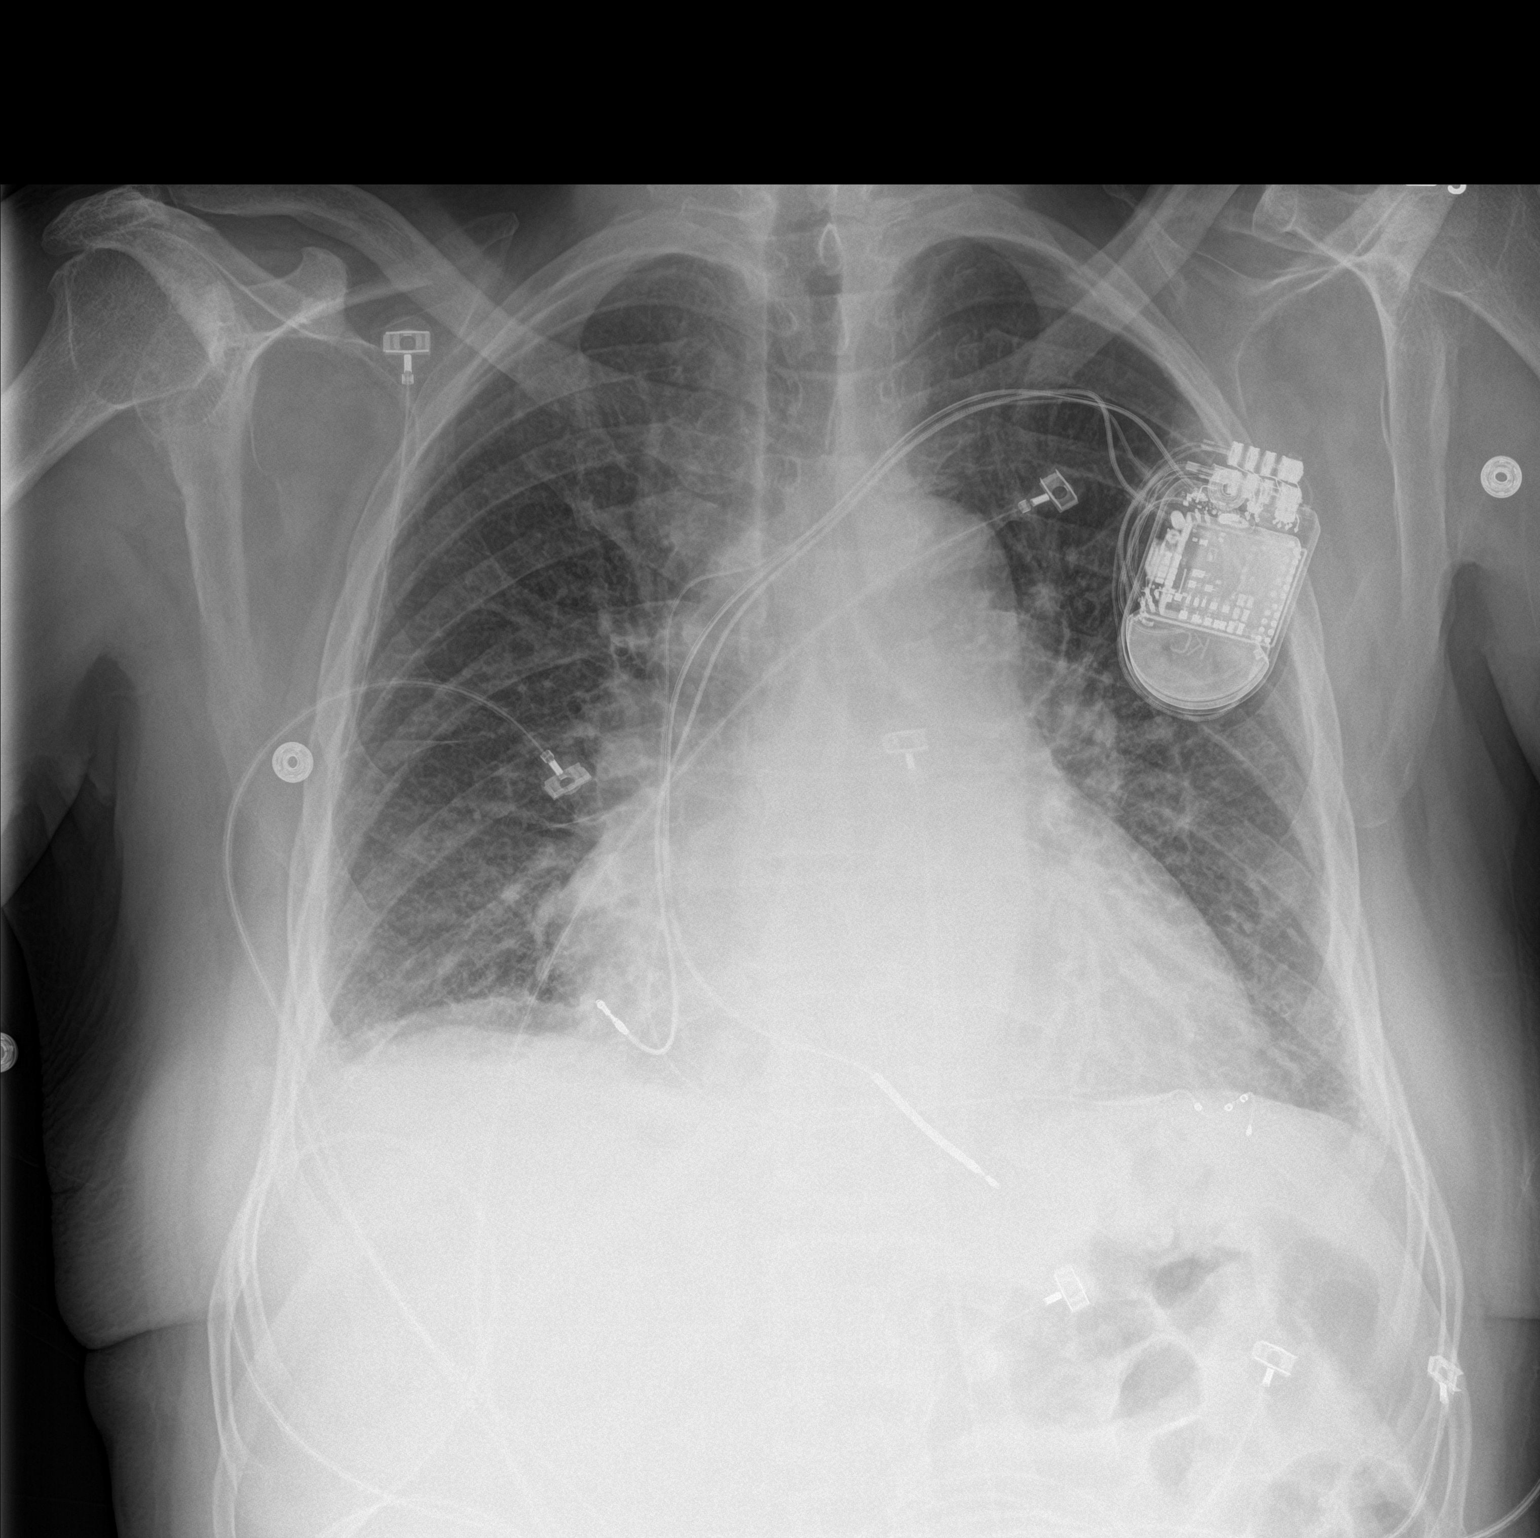

[chest lat]
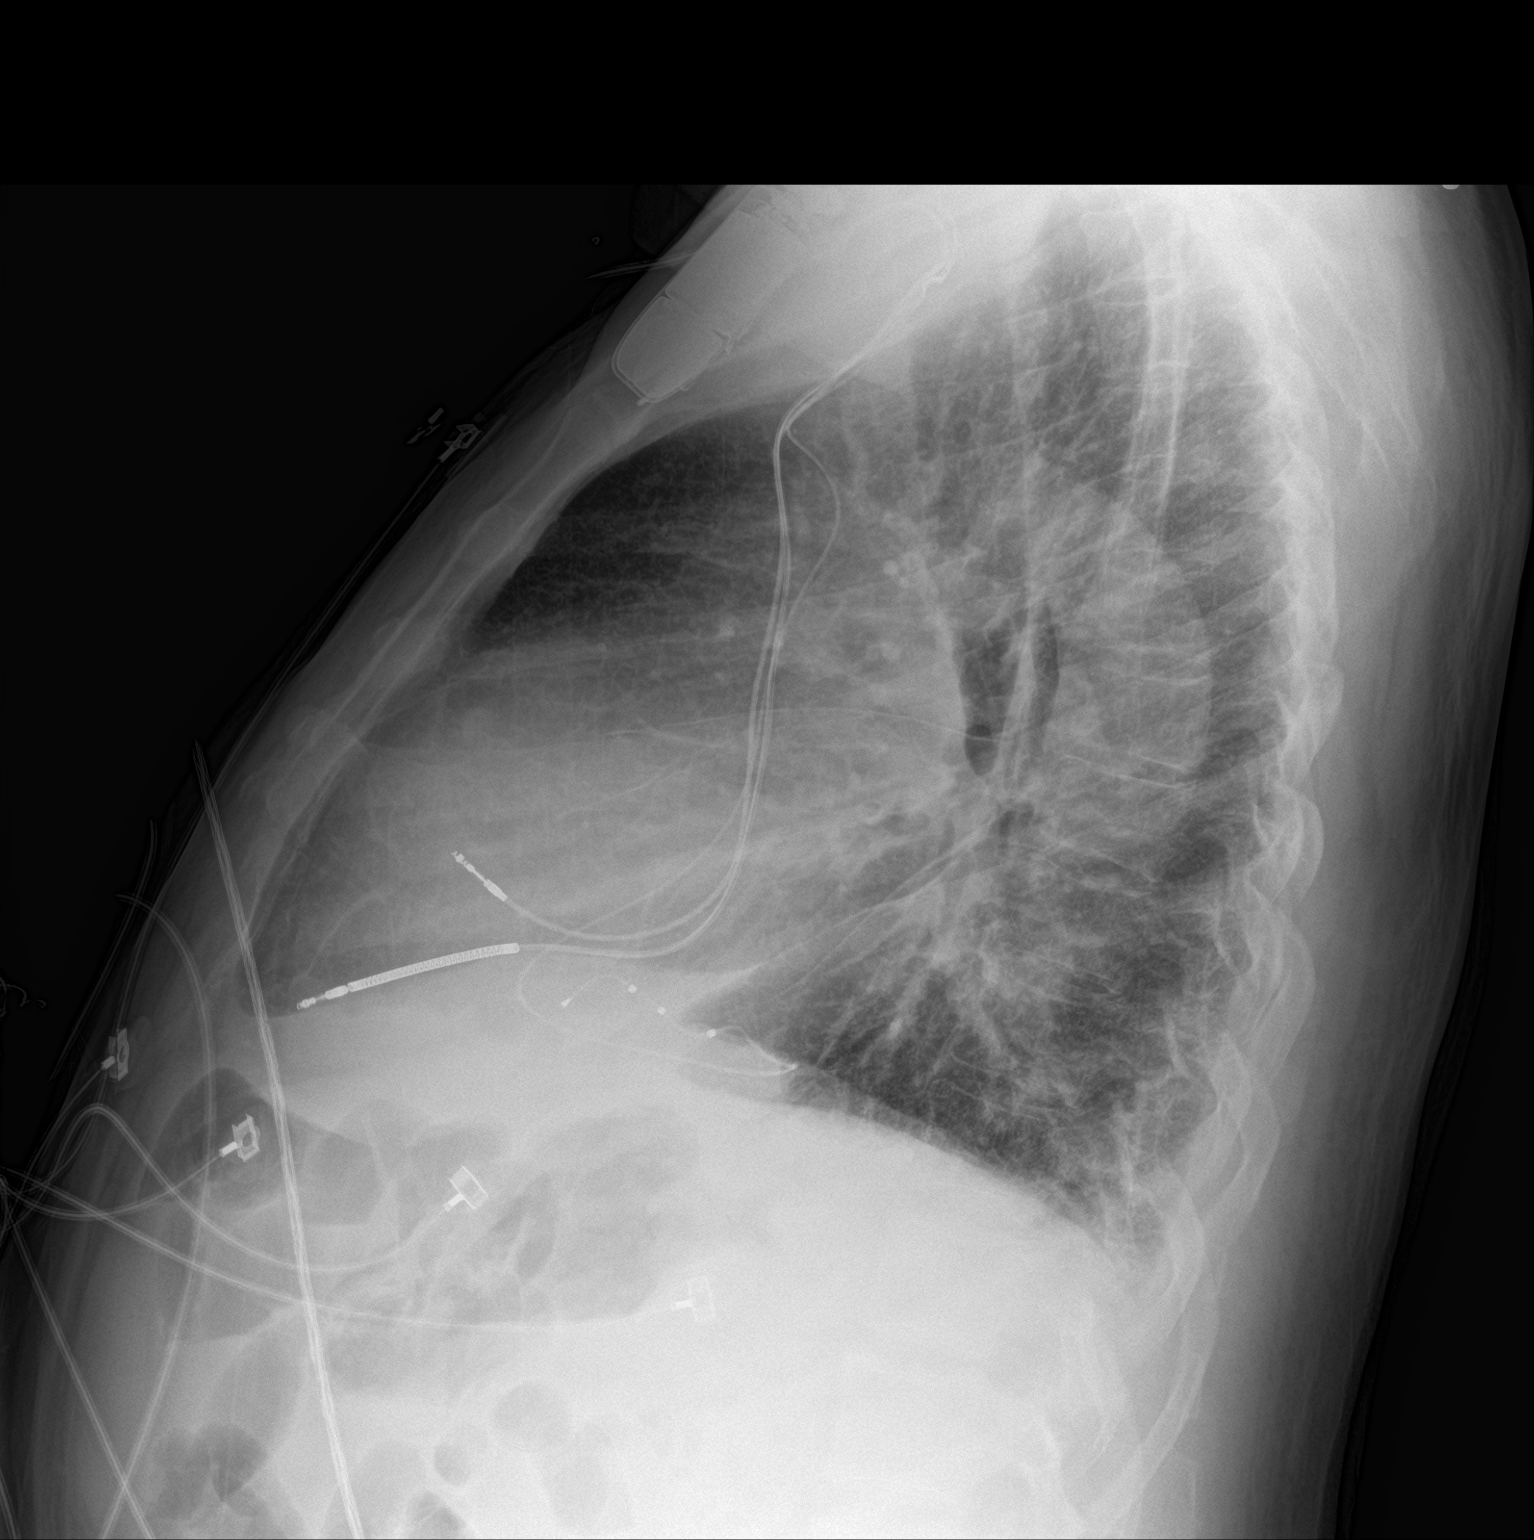

[2 of 2 positions shown; findings below may reference images not displayed]

FINDINGS: There is cardiomegaly without pulmonary edema. Trace right pleural
effusion is seen and there is mild subsegmental atelectasis in the
right lung base. Aeration is markedly improved since the prior plain
film. AICD is noted. No pneumothorax.
IMPRESSION: Cardiomegaly and pulmonary vascular congestion. Aeration appears
markedly improved since the prior plain film.

Trace right pleural effusion and subsegmental atelectasis right lung
base.
